# Patient Record
Sex: Female | Born: 1945 | Race: White | Hispanic: No | Marital: Married | Smoking: Never smoker
Health system: Southern US, Community
[De-identification: ages and names within clinical notes are randomized; demographics above are authoritative.]

## PROBLEM LIST (undated history)

## (undated) DIAGNOSIS — M659 Unspecified synovitis and tenosynovitis, unspecified site: Secondary | ICD-10-CM

## (undated) DIAGNOSIS — J329 Chronic sinusitis, unspecified: Secondary | ICD-10-CM

## (undated) DIAGNOSIS — A770 Spotted fever due to Rickettsia rickettsii: Secondary | ICD-10-CM

## (undated) DIAGNOSIS — L570 Actinic keratosis: Secondary | ICD-10-CM

## (undated) DIAGNOSIS — H269 Unspecified cataract: Secondary | ICD-10-CM

## (undated) DIAGNOSIS — Z0282 Encounter for adoption services: Secondary | ICD-10-CM

## (undated) DIAGNOSIS — G709 Myoneural disorder, unspecified: Secondary | ICD-10-CM

## (undated) DIAGNOSIS — K219 Gastro-esophageal reflux disease without esophagitis: Secondary | ICD-10-CM

## (undated) DIAGNOSIS — I10 Essential (primary) hypertension: Secondary | ICD-10-CM

## (undated) DIAGNOSIS — Z789 Other specified health status: Secondary | ICD-10-CM

## (undated) DIAGNOSIS — E785 Hyperlipidemia, unspecified: Secondary | ICD-10-CM

## (undated) HISTORY — PX: OTHER SURGICAL HISTORY: SHX169

## (undated) HISTORY — DX: Other specified health status: Z78.9

## (undated) HISTORY — PX: CHOLECYSTECTOMY: SHX55

## (undated) HISTORY — DX: Gastro-esophageal reflux disease without esophagitis: K21.9

## (undated) HISTORY — DX: Unspecified cataract: H26.9

## (undated) HISTORY — DX: Actinic keratosis: L57.0

## (undated) HISTORY — DX: Chronic sinusitis, unspecified: J32.9

## (undated) HISTORY — DX: Synovitis and tenosynovitis, unspecified: M65.9

## (undated) HISTORY — DX: Encounter for adoption services: Z02.82

## (undated) HISTORY — PX: FOOT SURGERY: SHX648

## (undated) HISTORY — DX: Unspecified synovitis and tenosynovitis, unspecified site: M65.90

## (undated) HISTORY — PX: GALLBLADDER SURGERY: SHX652

## (undated) HISTORY — DX: Spotted fever due to Rickettsia rickettsii: A77.0

## (undated) HISTORY — PX: BUNIONECTOMY: SHX129

## (undated) HISTORY — PX: CATARACT EXTRACTION: SUR2

## (undated) HISTORY — PX: ABDOMINAL HYSTERECTOMY: SHX81

---

## 2004-11-22 HISTORY — PX: ACHILLES TENDON SURGERY: SHX542

## 2010-09-16 ENCOUNTER — Ambulatory Visit: Payer: Self-pay | Admitting: Family Medicine

## 2010-09-22 ENCOUNTER — Ambulatory Visit: Payer: Self-pay | Admitting: Family Medicine

## 2010-10-22 ENCOUNTER — Ambulatory Visit: Payer: Self-pay | Admitting: Family Medicine

## 2011-01-13 HISTORY — PX: COLONOSCOPY: SHX174

## 2011-10-01 ENCOUNTER — Encounter: Payer: Self-pay | Admitting: Internal Medicine

## 2011-10-01 ENCOUNTER — Ambulatory Visit (INDEPENDENT_AMBULATORY_CARE_PROVIDER_SITE_OTHER): Payer: Medicare Other | Admitting: Internal Medicine

## 2011-10-01 VITALS — BP 152/62 | HR 106 | Temp 98.8°F | Ht 61.0 in | Wt 148.0 lb

## 2011-10-01 DIAGNOSIS — Z01818 Encounter for other preprocedural examination: Secondary | ICD-10-CM

## 2011-10-01 DIAGNOSIS — E1142 Type 2 diabetes mellitus with diabetic polyneuropathy: Secondary | ICD-10-CM | POA: Insufficient documentation

## 2011-10-01 DIAGNOSIS — I10 Essential (primary) hypertension: Secondary | ICD-10-CM | POA: Insufficient documentation

## 2011-10-01 DIAGNOSIS — E1169 Type 2 diabetes mellitus with other specified complication: Secondary | ICD-10-CM | POA: Insufficient documentation

## 2011-10-01 DIAGNOSIS — E119 Type 2 diabetes mellitus without complications: Secondary | ICD-10-CM

## 2011-10-01 DIAGNOSIS — E785 Hyperlipidemia, unspecified: Secondary | ICD-10-CM

## 2011-10-01 NOTE — Progress Notes (Signed)
Subjective:    Patient ID: Anita Mercer, female    DOB: November 19, 1946, 65 y.o.   MRN: [id number]  HPI 65 year old female with a history of diabetes, hypertension, and hyperlipidemia presents to establish care. She reports that she has good control of her blood sugars and her recent hemoglobin A1c was 6.9%. She denies any low blood sugars. She denies any blood sugars greater than 200. She reports full compliance with her medications. In regards to her hypertension she reports that occasionally she has an elevated blood pressure in the setting of anxiety but most of the time her blood pressures are well controlled. She also reports full compliance with her lisinopril. In regards to her hyperlipidemia she reports full compliance with her simvastatin and has not noticed any problems with this medication.  She notes that she is scheduled for bilateral cataract removal in the coming weeks and needs preoperative clearance. She has scheduled preop appointment at the hospital next week.  Outpatient Encounter Prescriptions as of 10/01/2011  Medication Sig Dispense Refill  . aspirin EC 81 MG tablet Take 81 mg by mouth daily.        . insulin lispro (HUMALOG KWIKPEN) 100 UNIT/ML injection Inject into the skin as directed. Use only w/blood sugar greater than >250       . insulin NPH (HUMULIN N,NOVOLIN N) 100 UNIT/ML injection Inject 21 Units into the skin daily.        Marland Kitchen lisinopril (PRINIVIL,ZESTRIL) 10 MG tablet Take 10 mg by mouth daily.        . metFORMIN (GLUCOPHAGE) 1000 MG tablet Take 1,000 mg by mouth 2 (two) times daily.        . OMEGA-3 KRILL OIL 300 MG CAPS Take by mouth daily.        . simvastatin (ZOCOR) 20 MG tablet Take 20 mg by mouth at bedtime.        . sitaGLIPtin (JANUVIA) 100 MG tablet Take 100 mg by mouth daily.          Review of Systems  Constitutional: Negative for fever, chills, appetite change, fatigue and unexpected weight change.  HENT: Negative for ear pain, congestion, sore throat,  trouble swallowing, neck pain, voice change and sinus pressure.   Eyes: Negative for visual disturbance.  Respiratory: Negative for cough, shortness of breath, wheezing and stridor.   Cardiovascular: Negative for chest pain, palpitations and leg swelling.  Gastrointestinal: Negative for nausea, vomiting, abdominal pain, diarrhea, constipation, blood in stool, abdominal distention and anal bleeding.  Genitourinary: Negative for dysuria and flank pain.  Musculoskeletal: Negative for myalgias, arthralgias and gait problem.  Skin: Negative for color change and rash.  Neurological: Negative for dizziness and headaches.  Hematological: Negative for adenopathy. Does not bruise/bleed easily.  Psychiatric/Behavioral: Negative for suicidal ideas, sleep disturbance and dysphoric mood. The patient is not nervous/anxious.    BP 152/62  Pulse 106  Temp(Src) 98.8 F (37.1 C) (Oral)  Ht 5\' 1"  (1.549 m)  Wt 148 lb (67.132 kg)  BMI 27.96 kg/m2  SpO2 97%     Objective:   Physical Exam  Constitutional: She is oriented to person, place, and time. She appears well-developed and well-nourished. No distress.  HENT:  Head: Normocephalic and atraumatic.  Right Ear: External ear normal.  Left Ear: External ear normal.  Nose: Nose normal.  Mouth/Throat: Oropharynx is clear and moist. No oropharyngeal exudate.  Eyes: Conjunctivae are normal. Pupils are equal, round, and reactive to light. Right eye exhibits no discharge. Left eye exhibits no  discharge. No scleral icterus.  Neck: Normal range of motion. Neck supple. No tracheal deviation present. No thyromegaly present.  Cardiovascular: Normal rate, regular rhythm, normal heart sounds and intact distal pulses.  Exam reveals no gallop and no friction rub.   No murmur heard. Pulmonary/Chest: Effort normal and breath sounds normal. No respiratory distress. She has no wheezes. She has no rales. She exhibits no tenderness.  Abdominal: Soft. Bowel sounds are normal.  She exhibits no distension and no mass. There is no tenderness. There is no rebound and no guarding.  Musculoskeletal: Normal range of motion. She exhibits no edema and no tenderness.  Lymphadenopathy:    She has no cervical adenopathy.  Neurological: She is alert and oriented to person, place, and time. No cranial nerve deficit. She exhibits normal muscle tone. Coordination normal.  Skin: Skin is warm and dry. No rash noted. She is not diaphoretic. No erythema. No pallor.  Psychiatric: She has a normal mood and affect. Her behavior is normal. Judgment and thought content normal.          Assessment & Plan:  1. Diabetes mellitus - Pt reports good control. Last A1c of 6.9%. Will request reports from Dr. Tedd Sias. Follow up 2 months for physical.  2. Hypertension - BP elevated today, but pt anxious. She reports better control historically. Will continue lisinopril. Will get records from Dr. Tedd Sias.   3. Hyperlipidemia - Will get records on recent lipids from Dr. Tedd Sias. Continue simvastatin.  4. Cataract - Pt is scheduled for cataract removal. Based on provided history she would be low risk for perioperative cardiac events.  EKG today normal.

## 2011-10-18 ENCOUNTER — Ambulatory Visit: Payer: Self-pay | Admitting: Ophthalmology

## 2011-10-20 ENCOUNTER — Encounter: Payer: Self-pay | Admitting: Internal Medicine

## 2011-12-06 ENCOUNTER — Encounter: Payer: Medicare Other | Admitting: Internal Medicine

## 2012-01-17 ENCOUNTER — Ambulatory Visit: Payer: Self-pay | Admitting: Gastroenterology

## 2012-01-18 LAB — PATHOLOGY REPORT

## 2012-01-31 ENCOUNTER — Encounter: Payer: Self-pay | Admitting: Internal Medicine

## 2012-03-09 ENCOUNTER — Encounter: Payer: Self-pay | Admitting: Internal Medicine

## 2012-03-09 ENCOUNTER — Ambulatory Visit (INDEPENDENT_AMBULATORY_CARE_PROVIDER_SITE_OTHER): Payer: Medicare Other | Admitting: Internal Medicine

## 2012-03-09 DIAGNOSIS — Z0000 Encounter for general adult medical examination without abnormal findings: Secondary | ICD-10-CM

## 2012-03-09 MED ORDER — INSULIN NPH (HUMAN) (ISOPHANE) 100 UNIT/ML ~~LOC~~ SUSP: 25.0000 [IU] | Freq: Every day | SUBCUTANEOUS | Status: DC

## 2012-03-09 NOTE — Progress Notes (Signed)
Subjective:    Patient ID: Anita Mercer, female    DOB: 08/22/1946, 66 y.o.   MRN: [id number]  HPI   Subjective:    Anita Mercer is a 66 y.o. female who presents for Medicare Annual First preventive examination. She is doing well and has no concerns today.  Preventive Screening-Counseling & Management  Tobacco History  Smoking status  . Never Smoker   Smokeless tobacco  . Never Used     Problems Prior to Visit 1. Diabetes 2. Hypertension 3. Hyperlipidemia  Current Problems (verified) Patient Active Problem List  Diagnoses  . Hyperlipidemia  . Hypertension  . Diabetes mellitus    Medications Prior to Visit Current Outpatient Prescriptions on File Prior to Visit  Medication Sig Dispense Refill  . aspirin EC 81 MG tablet Take 81 mg by mouth daily.        . insulin lispro (HUMALOG KWIKPEN) 100 UNIT/ML injection Inject into the skin as directed. Use only w/blood sugar greater than >250       . insulin NPH (HUMULIN N,NOVOLIN N) 100 UNIT/ML injection Inject 21 Units into the skin daily.        Marland Kitchen lisinopril (PRINIVIL,ZESTRIL) 10 MG tablet Take 10 mg by mouth daily.        . metFORMIN (GLUCOPHAGE) 1000 MG tablet Take 1,000 mg by mouth 2 (two) times daily.        . OMEGA-3 KRILL OIL 300 MG CAPS Take by mouth daily.        . simvastatin (ZOCOR) 20 MG tablet Take 20 mg by mouth at bedtime.        . sitaGLIPtin (JANUVIA) 100 MG tablet Take 100 mg by mouth daily.          Current Medications (verified) Current Outpatient Prescriptions  Medication Sig Dispense Refill  . aspirin EC 81 MG tablet Take 81 mg by mouth daily.        . insulin lispro (HUMALOG KWIKPEN) 100 UNIT/ML injection Inject into the skin as directed. Use only w/blood sugar greater than >250       . insulin NPH (HUMULIN N,NOVOLIN N) 100 UNIT/ML injection Inject 21 Units into the skin daily.        Marland Kitchen lisinopril (PRINIVIL,ZESTRIL) 10 MG tablet Take 10 mg by mouth daily.        . metFORMIN (GLUCOPHAGE) 1000 MG tablet  Take 1,000 mg by mouth 2 (two) times daily.        . NON FORMULARY Neu Remedy-150mg  Benfoltiamine take one tablet by mouth daily      . OMEGA-3 KRILL OIL 300 MG CAPS Take by mouth daily.        . simvastatin (ZOCOR) 20 MG tablet Take 20 mg by mouth at bedtime.        . sitaGLIPtin (JANUVIA) 100 MG tablet Take 100 mg by mouth daily.           Allergies (verified) Review of patient's allergies indicates no known allergies.   PAST HISTORY  Family History Family History  Problem Relation Age of Onset  . Adopted: Yes    Social History History  Substance Use Topics  . Smoking status: Never Smoker   . Smokeless tobacco: Never Used  . Alcohol Use: No     Are there smokers in your home (other than you)? No  Risk Factors Current exercise habits: Home exercise routine includes walking, gardening every day..  Dietary issues discussed: Heart healthy, low carbs, low in saturated fat.   Cardiac risk  factors: advanced age (older than 26 for men, 60 for women) and diabetes mellitus.  Depression Screen (Note: if answer to either of the following is "Yes", a more complete depression screening is indicated)   Over the past two weeks, have you felt down, depressed or hopeless? No  Over the past two weeks, have you felt little interest or pleasure in doing things? No  Have you lost interest or pleasure in daily life? No  Do you often feel hopeless? No  Do you cry easily over simple problems? No  Activities of Daily Living In your present state of health, do you have any difficulty performing the following activities?:  Driving? No Managing money?  No Feeding yourself? No Getting from bed to chair? No Climbing a flight of stairs? No Preparing food and eating?: No Bathing or showering? No Getting dressed: No Getting to the toilet? No Using the toilet:No Moving around from place to place: No In the past year have you fallen or had a near fall?:No   Are you sexually active?  Yes  Do  you have more than one partner?  No  Hearing Difficulties: No Do you often ask people to speak up or repeat themselves? No Do you experience ringing or noises in your ears? Occasionally at night Do you have difficulty understanding soft or whispered voices? No   Do you feel that you have a problem with memory? No  Do you often misplace items? No  Do you feel safe at home?  Yes  Cognitive Testing  Alert? Yes  Normal Appearance?Yes  Oriented to person? Yes  Place? Yes   Time? Yes  Recall of three objects?  Yes  Can perform simple calculations? Yes  Displays appropriate judgment?Yes  Can read the correct time from a watch face?Yes     Advanced Directives have been discussed with the patient? Yes  List the Names of Other Physician/Practitioners you currently use: See List in Pt Chart     Screening Tests Health Maintenance  Topic Date Due  . Tetanus/tdap  05/06/1964  . Zostavax  05/06/2005  . Pneumococcal Polysaccharide Vaccine Age 66 And Over  05/06/2010  . Influenza Vaccine  08/22/2012  . Mammogram  05/30/2013  . Colonoscopy  01/16/2022    All answers were reviewed with the patient and necessary referrals were made. Pt has follow up with opthalmology after recent cataract removal.  Shelia Media, MD   03/09/2012   History reviewed: allergies, current medications, past family history, past medical history, past social history, past surgical history and problem list  Review of Systems A comprehensive review of systems was negative.    Objective:     Vision testing deferred because of recent cataract surgery and follow up scheduled with opthalmology.  Body mass index is 27.98 kg/(m^2). BP 142/64  Pulse 79  Temp(Src) 98.2 F (36.8 C) (Oral)  Ht 5' 1.5" (1.562 m)  Wt 150 lb 8 oz (68.266 kg)  BMI 27.98 kg/m2  SpO2 99%  General appearance: alert, cooperative, appears stated age and no distress Head: Normocephalic, without obvious abnormality, atraumatic Eyes:  conjunctivae/corneas clear. PERRL, EOM's intact. Fundi benign. Ears: normal TM's and external ear canals both ears Nose: Nares normal. Septum midline. Mucosa normal. No drainage or sinus tenderness. Throat: lips, mucosa, and tongue normal; teeth and gums normal Neck: no adenopathy, no carotid bruit, no JVD, supple, symmetrical, trachea midline and thyroid not enlarged, symmetric, no tenderness/mass/nodules Back: symmetric, no curvature. ROM normal. No CVA tenderness. Lungs: clear to  auscultation bilaterally Breasts: normal appearance, no masses or tenderness Heart: regular rate and rhythm, S1, S2 normal, no murmur, click, rub or gallop Abdomen: soft, non-tender; bowel sounds normal; no masses,  no organomegaly Extremities: extremities normal, atraumatic, no cyanosis or edema Pulses: 2+ and symmetric Skin: Skin color, texture, turgor normal. No rashes or lesions Lymph nodes: Cervical, supraclavicular, and axillary nodes normal. Neurologic: Alert and oriented X 3, normal strength and tone. Normal symmetric reflexes. Normal coordination and gait     Assessment:     66YO female with DM, hypertension, hyperlipidemia. Doing very well. Follows healthy diet and exercises regularly. Has scheduled follow up with endocrinology and opthalmology. Health maintenance UTD. Follow up here in 6 months.     Plan:     During the course of the visit the patient was educated and counseled about appropriate screening and preventive services including:    Nutrition counseling - Encouraged pt to continue efforts at healthy diet and exercise.   Patient Instructions (the written plan) was given to the patient.  Medicare Attestation I have personally reviewed: The patient's medical and social history Their use of alcohol, tobacco or illicit drugs Their current medications and supplements The patient's functional ability including ADLs,fall risks, home safety risks, cognitive, and hearing and visual  impairment Diet and physical activities Evidence for depression or mood disorders  The patient's weight, height, BMI, and visual acuity have been recorded in the chart.  I have made referrals, counseling, and provided education to the patient based on review of the above and I have provided the patient with a written personalized care plan for preventive services.     Shelia Media, MD   03/09/2012        Review of Systems     Objective:   Physical Exam        Assessment & Plan:

## 2012-06-01 ENCOUNTER — Other Ambulatory Visit: Payer: Self-pay | Admitting: Internal Medicine

## 2012-06-01 DIAGNOSIS — Z1239 Encounter for other screening for malignant neoplasm of breast: Secondary | ICD-10-CM

## 2012-06-01 DIAGNOSIS — C50919 Malignant neoplasm of unspecified site of unspecified female breast: Secondary | ICD-10-CM

## 2012-06-13 ENCOUNTER — Encounter: Payer: Self-pay | Admitting: Internal Medicine

## 2012-06-23 ENCOUNTER — Ambulatory Visit: Payer: Medicare Other | Admitting: Internal Medicine

## 2012-09-11 ENCOUNTER — Encounter: Payer: Self-pay | Admitting: Internal Medicine

## 2012-09-11 ENCOUNTER — Ambulatory Visit (INDEPENDENT_AMBULATORY_CARE_PROVIDER_SITE_OTHER): Payer: Medicare Other | Admitting: Internal Medicine

## 2012-09-11 VITALS — BP 146/82 | HR 86 | Temp 97.6°F | Ht 61.5 in | Wt 152.2 lb

## 2012-09-11 DIAGNOSIS — E785 Hyperlipidemia, unspecified: Secondary | ICD-10-CM

## 2012-09-11 DIAGNOSIS — I10 Essential (primary) hypertension: Secondary | ICD-10-CM

## 2012-09-11 DIAGNOSIS — E119 Type 2 diabetes mellitus without complications: Secondary | ICD-10-CM

## 2012-09-11 DIAGNOSIS — M792 Neuralgia and neuritis, unspecified: Secondary | ICD-10-CM | POA: Insufficient documentation

## 2012-09-11 DIAGNOSIS — IMO0002 Reserved for concepts with insufficient information to code with codable children: Secondary | ICD-10-CM

## 2012-09-11 MED ORDER — SITAGLIPTIN PHOSPHATE 100 MG PO TABS: 100.0000 mg | ORAL_TABLET | Freq: Every day | ORAL | Status: DC

## 2012-09-11 MED ORDER — TRAMADOL HCL 50 MG PO TABS: 50.0000 mg | ORAL_TABLET | Freq: Three times a day (TID) | ORAL | Status: DC | PRN

## 2012-09-11 NOTE — Assessment & Plan Note (Signed)
Blood pressure slightly elevated today but has been well controlled at home. We'll continue lisinopril. Followup in 6 months.

## 2012-09-11 NOTE — Assessment & Plan Note (Signed)
Blood sugars well controlled per pt. Will request recent A1c from endocrinologist. Continue lantus. Follow up here in 6 months and prn.

## 2012-09-11 NOTE — Progress Notes (Signed)
Subjective:    Patient ID: Anita Mercer, female    DOB: 1946-01-12, 66 y.o.   MRN: [id number]  HPI 66 year old female with history of diabetes, hypertension, hyperlipidemia presents for followup. In regards to diabetes, she reports that blood sugars have been well controlled with fasting blood sugars typically near 100. She reports full compliance with Lantus. She denies any low sugars or sugars greater than 200.  In regards to hypertension hyperlipidemia, she reports full compliance with medications. She denies any side effects from her medicines.  She is concerned today about nerve pain over her feet and lower legs. She describes a sense of heaviness in her legs. She reports that when she wears tennis shoes the pain in her feet is worse. Her endocrinologist recommended that she try Neurontin. However, after reading side effects of this medication she did not start the medicine. She is currently using ibuprofen and Tylenol as needed. She would like to use Vicodin as needed at night for severe pain.  Outpatient Encounter Prescriptions as of 09/11/2012  Medication Sig Dispense Refill  . aspirin EC 81 MG tablet Take 81 mg by mouth daily.        . insulin glargine (LANTUS) 100 UNIT/ML injection Inject 30 Units into the skin daily.      . insulin lispro (HUMALOG KWIKPEN) 100 UNIT/ML injection Inject into the skin as directed. Use only w/blood sugar greater than >250       . lisinopril (PRINIVIL,ZESTRIL) 10 MG tablet Take 10 mg by mouth daily.        . metFORMIN (GLUCOPHAGE) 1000 MG tablet Take 1,000 mg by mouth 2 (two) times daily.        . NON FORMULARY Neu Remedy-150mg  Benfoltiamine take one tablet by mouth daily      . OMEGA-3 KRILL OIL 300 MG CAPS Take by mouth daily.        . simvastatin (ZOCOR) 20 MG tablet Take 20 mg by mouth at bedtime.        . sitaGLIPtin (JANUVIA) 100 MG tablet Take 1 tablet (100 mg total) by mouth daily.  30 tablet  11   BP 146/82  Pulse 86  Temp 97.6 F (36.4 C)  (Oral)  Ht 5' 1.5" (1.562 m)  Wt 152 lb 4 oz (69.06 kg)  BMI 28.30 kg/m2  SpO2 92%  Review of Systems  Constitutional: Negative for fever, chills, appetite change, fatigue and unexpected weight change.  HENT: Negative for ear pain, congestion, sore throat, trouble swallowing, neck pain, voice change and sinus pressure.   Eyes: Negative for visual disturbance.  Respiratory: Negative for cough, shortness of breath, wheezing and stridor.   Cardiovascular: Negative for chest pain, palpitations and leg swelling.  Gastrointestinal: Negative for nausea, vomiting, abdominal pain, diarrhea, constipation, blood in stool, abdominal distention and anal bleeding.  Genitourinary: Negative for dysuria and flank pain.  Musculoskeletal: Negative for myalgias, arthralgias and gait problem.  Skin: Negative for color change and rash.  Neurological: Negative for dizziness and headaches.  Hematological: Negative for adenopathy. Does not bruise/bleed easily.  Psychiatric/Behavioral: Negative for suicidal ideas, disturbed wake/sleep cycle and dysphoric mood. The patient is not nervous/anxious.        Objective:   Physical Exam  Constitutional: She is oriented to person, place, and time. She appears well-developed and well-nourished. No distress.  HENT:  Head: Normocephalic and atraumatic.  Right Ear: External ear normal.  Left Ear: External ear normal.  Nose: Nose normal.  Mouth/Throat: Oropharynx is clear and moist. No  oropharyngeal exudate.  Eyes: Conjunctivae normal are normal. Pupils are equal, round, and reactive to light. Right eye exhibits no discharge. Left eye exhibits no discharge. No scleral icterus.  Neck: Normal range of motion. Neck supple. No tracheal deviation present. No thyromegaly present.  Cardiovascular: Normal rate, regular rhythm, normal heart sounds and intact distal pulses.  Exam reveals no gallop and no friction rub.   No murmur heard. Pulmonary/Chest: Effort normal and breath  sounds normal. No respiratory distress. She has no wheezes. She has no rales. She exhibits no tenderness.  Musculoskeletal: Normal range of motion. She exhibits no edema and no tenderness.  Lymphadenopathy:    She has no cervical adenopathy.  Neurological: She is alert and oriented to person, place, and time. No cranial nerve deficit. She exhibits normal muscle tone. Coordination normal.  Skin: Skin is warm and dry. No rash noted. She is not diaphoretic. No erythema. No pallor.  Psychiatric: She has a normal mood and affect. Her behavior is normal. Judgment and thought content normal.          Assessment & Plan:

## 2012-09-11 NOTE — Assessment & Plan Note (Signed)
Symptoms are most consistent with diabetic neuropathy. Patient's endocrinologist tried starting her on Neurontin but she did not take this medication because she was concerned about side effects. Encouraged her to reconsider trying this. She specifically requests Vicodin. Would prefer to try a nonnarcotic medications first. Will give a trial of tramadol to see if any improvement. If no improvement, consider both referral to neurology for EMG testing and also potentially referral to pain management.

## 2012-09-11 NOTE — Assessment & Plan Note (Signed)
Will request results from recent lipids from her endocrinologist. Continue simvastatin.

## 2012-09-14 ENCOUNTER — Encounter: Payer: Self-pay | Admitting: Internal Medicine

## 2012-09-14 DIAGNOSIS — G629 Polyneuropathy, unspecified: Secondary | ICD-10-CM

## 2012-09-19 NOTE — Telephone Encounter (Signed)
Can you please set up neurology referral? Thanks

## 2012-09-20 LAB — HM DIABETES EYE EXAM

## 2012-10-23 HISTORY — PX: OTHER SURGICAL HISTORY: SHX169

## 2012-11-25 ENCOUNTER — Encounter: Payer: Self-pay | Admitting: Internal Medicine

## 2012-11-27 ENCOUNTER — Other Ambulatory Visit (INDEPENDENT_AMBULATORY_CARE_PROVIDER_SITE_OTHER): Payer: Medicare PPO

## 2012-11-27 ENCOUNTER — Telehealth: Payer: Self-pay | Admitting: *Deleted

## 2012-11-27 DIAGNOSIS — N390 Urinary tract infection, site not specified: Secondary | ICD-10-CM

## 2012-11-27 LAB — POCT URINALYSIS DIPSTICK
Protein, UA: NEGATIVE
Urobilinogen, UA: 0.2

## 2012-11-27 NOTE — Telephone Encounter (Signed)
Hematuria. Please add a culture

## 2012-11-27 NOTE — Telephone Encounter (Signed)
What labs and dx would you like for this pt? Is she here for just a udip?

## 2012-11-29 ENCOUNTER — Other Ambulatory Visit (INDEPENDENT_AMBULATORY_CARE_PROVIDER_SITE_OTHER): Payer: Medicare PPO

## 2012-11-29 DIAGNOSIS — R319 Hematuria, unspecified: Secondary | ICD-10-CM

## 2012-11-29 LAB — POCT URINALYSIS DIPSTICK
Blood, UA: NEGATIVE
Protein, UA: NEGATIVE
Spec Grav, UA: 1.015
Urobilinogen, UA: 0.2

## 2012-11-29 LAB — URINE CULTURE: Colony Count: NO GROWTH

## 2013-01-18 ENCOUNTER — Encounter: Payer: Self-pay | Admitting: Internal Medicine

## 2013-03-04 ENCOUNTER — Other Ambulatory Visit: Payer: Self-pay | Admitting: Internal Medicine

## 2013-03-08 ENCOUNTER — Other Ambulatory Visit: Payer: Self-pay | Admitting: Internal Medicine

## 2013-03-13 ENCOUNTER — Other Ambulatory Visit: Payer: Self-pay | Admitting: Internal Medicine

## 2013-03-13 NOTE — Telephone Encounter (Signed)
Received refill request electronically. Last office visit 09/11/12. Is it okay to refill?

## 2013-03-15 ENCOUNTER — Encounter: Payer: Self-pay | Admitting: Internal Medicine

## 2013-03-15 ENCOUNTER — Ambulatory Visit (INDEPENDENT_AMBULATORY_CARE_PROVIDER_SITE_OTHER): Payer: Medicare PPO | Admitting: Internal Medicine

## 2013-03-15 VITALS — BP 150/80 | HR 88 | Temp 99.0°F | Ht 61.25 in | Wt 153.0 lb

## 2013-03-15 DIAGNOSIS — M792 Neuralgia and neuritis, unspecified: Secondary | ICD-10-CM

## 2013-03-15 DIAGNOSIS — J302 Other seasonal allergic rhinitis: Secondary | ICD-10-CM

## 2013-03-15 DIAGNOSIS — J309 Allergic rhinitis, unspecified: Secondary | ICD-10-CM

## 2013-03-15 DIAGNOSIS — I10 Essential (primary) hypertension: Secondary | ICD-10-CM

## 2013-03-15 DIAGNOSIS — E119 Type 2 diabetes mellitus without complications: Secondary | ICD-10-CM

## 2013-03-15 DIAGNOSIS — E785 Hyperlipidemia, unspecified: Secondary | ICD-10-CM

## 2013-03-15 DIAGNOSIS — IMO0002 Reserved for concepts with insufficient information to code with codable children: Secondary | ICD-10-CM

## 2013-03-15 DIAGNOSIS — Z0000 Encounter for general adult medical examination without abnormal findings: Secondary | ICD-10-CM

## 2013-03-15 LAB — COMPREHENSIVE METABOLIC PANEL
ALT: 23 U/L (ref 0–35)
BUN: 16 mg/dL (ref 6–23)
CO2: 29 mEq/L (ref 19–32)
Calcium: 9.8 mg/dL (ref 8.4–10.5)
Chloride: 98 mEq/L (ref 96–112)
Creatinine, Ser: 0.6 mg/dL (ref 0.4–1.2)
GFR: 99.92 mL/min (ref >60.00)

## 2013-03-15 LAB — LIPID PANEL
Cholesterol: 129 mg/dL (ref 0–200)
HDL: 34.9 mg/dL — ABNORMAL LOW (ref >39.00)
LDL Cholesterol: 65 mg/dL (ref 0–99)
Triglycerides: 148 mg/dL (ref 0.0–149.0)
VLDL: 29.6 mg/dL (ref 0.0–40.0)

## 2013-03-15 LAB — CBC WITH DIFFERENTIAL/PLATELET
Basophils Absolute: 0 10*3/uL (ref 0.0–0.1)
Basophils Relative: 0.5 % (ref 0.0–3.0)
Eosinophils Absolute: 0 10*3/uL (ref 0.0–0.7)
Lymphocytes Relative: 37.4 % (ref 12.0–46.0)
MCHC: 33.2 g/dL (ref 30.0–36.0)
MCV: 82.9 fl (ref 78.0–100.0)
Monocytes Absolute: 0.4 10*3/uL (ref 0.1–1.0)
Neutrophils Relative %: 55.5 % (ref 43.0–77.0)
RDW: 12.7 % (ref 11.5–14.6)

## 2013-03-15 MED ORDER — ONETOUCH ULTRA CONTROL VI SOLN: 1.0000 [drp] | Freq: Once | Status: DC

## 2013-03-15 MED ORDER — ONETOUCH ULTRASOFT LANCETS MISC: Status: DC

## 2013-03-15 MED ORDER — TRAMADOL HCL 50 MG PO TABS: 50.0000 mg | ORAL_TABLET | Freq: Three times a day (TID) | ORAL | Status: DC | PRN

## 2013-03-15 MED ORDER — GLUCOSE BLOOD VI STRP: ORAL_STRIP | Status: DC

## 2013-03-15 NOTE — Assessment & Plan Note (Signed)
BP Readings from Last 3 Encounters:  03/15/13 150/80  09/11/12 146/82  03/09/12 142/64   Blood pressure slightly elevated today but generally has been well-controlled with lisinopril. Will check renal function with labs today.

## 2013-03-15 NOTE — Assessment & Plan Note (Addendum)
Last A1c 7.6% per endocrinologist. BG have been well-controlled on current regimen. We'll plan to continue. Encouraged continued efforts at healthy diet and regular physical activity.

## 2013-03-15 NOTE — Assessment & Plan Note (Signed)
General medical exam including breast exam normal today. Pap and pelvic deferred given patient's age and preference. Health maintenance is up to date except for mammogram which will be scheduled for July 2014. Encourage continued efforts at healthy diet and regular physical activity.

## 2013-03-15 NOTE — Assessment & Plan Note (Signed)
Symptoms of seasonal allergies including rhinorrhea, watery eyes, sneezing. Encouraged use of nonsedating antihistamine such as Zyrtec or allegra given that Claritin has not worked well for her in the past.

## 2013-03-15 NOTE — Assessment & Plan Note (Signed)
Chronic neuropathic pain in both arms and legs. Neurology evaluation with EMG testing showed mild to moderate peripheral neuropathy. Symptoms are currently managed well with use of intermittent tramadol. We discussed potentially adding Neurontin in the future, even adding 100 mg of Neurontin at bedtime should symptoms be progressively worsening. We also discussed importance of good glucose control. Followup in 6 months.

## 2013-03-15 NOTE — Progress Notes (Signed)
Subjective:    Patient ID: Anita Mercer, female    DOB: Sep 23, 1946, 67 y.o.   MRN: [id number]  HPI The patient is here for annual Medicare wellness examination and management of other chronic and acute problems.   The risk factors are reflected in the social history.  The roster of all physicians providing medical care to patient - is listed in the Snapshot section of the chart.  Activities of daily living:  The patient is 100% independent in all ADLs: dressing, toileting, feeding as well as independent mobility  Home safety : The patient has smoke detectors in the home. They wear seatbelts.  There are no firearms at home. There is no violence in the home.   There is no risks for hepatitis, STDs or HIV. There is no history of blood transfusion. They have no travel history to infectious disease endemic areas of the world.  The patient has seen their dentist in the last six month. (Dr. Jarold Motto) They have seen their eye doctor in the last year. (Dr. Dorcas Mcmurray) No issues with hearing.  They have deferred audiologic testing in the last year.   They do not  have excessive sun exposure. Discussed the need for sun protection: hats, long sleeves and use of sunscreen if there is significant sun exposure. (Dermatologist - none)  Diet: the importance of a healthy diet is discussed. They do have a healthy diet.  The benefits of regular aerobic exercise were discussed. She walks [place], yardwork.  Depression screen: there are no signs or vegative symptoms of depression- irritability, change in appetite, anhedonia, sadness/tearfullness.  Cognitive assessment: the patient manages all their financial and personal affairs and is actively engaged. They could relate day,date,year and events.  HCPOA - Mal Amabile, attorney  The following portions of the patient's history were reviewed and updated as appropriate: allergies, current medications, past family history, past medical history,  past  surgical history, past social history  and problem list.  Visual acuity was not assessed per patient preference since she has regular follow up with her ophthalmologist. Hearing and body mass index were assessed and reviewed.   During the course of the visit the patient was educated and counseled about appropriate screening and preventive services including : fall prevention , diabetes screening, nutrition counseling, colorectal cancer screening, and recommended immunizations.    In regards to diabetes, patient reports blood sugars have been well controlled. Recent A1c 7.6% when checked by her endocrinologist. She is compliant with medications. She questions whether she needs diabetic shoes. She has never had a diabetic foot ulcer.  She continues to be concerned about burning pain in her distal arms and legs. This is most prominent at night. It sometimes keeps her from sleeping. It is also worsened by activity. She was seen by neurology and underwent EMG testing which showed mild to moderate peripheral neuropathy. It was recommended that she start Neurontin. However, she feels that control of symptoms is adequate with use of intermittent tramadol. She prefers not to start Neurontin at this time.  She also notes symptoms of seasonal allergies including watery eyes and runny nose. This is typical for her during the spring months. In the past, she tried using Claritin with no improvement. She questions what other medications might be helpful. She has not recently had fever, chills, cough.  Outpatient Encounter Prescriptions as of 03/15/2013  Medication Sig Dispense Refill  . aspirin EC 81 MG tablet Take 81 mg by mouth daily.        Marland Kitchen  B-D UF III MINI PEN NEEDLES 31G X 5 MM MISC       . insulin glargine (LANTUS) 100 UNIT/ML injection Inject 35 Units into the skin daily.       . insulin lispro (HUMALOG KWIKPEN) 100 UNIT/ML injection Inject into the skin as directed. Use only w/blood sugar greater than >250        . lisinopril (PRINIVIL,ZESTRIL) 10 MG tablet Take 10 mg by mouth daily.        . metFORMIN (GLUCOPHAGE) 1000 MG tablet Take 1,000 mg by mouth 2 (two) times daily.        . OMEGA-3 KRILL OIL 300 MG CAPS Take by mouth daily.        . simvastatin (ZOCOR) 20 MG tablet Take 20 mg by mouth at bedtime.        . sitaGLIPtin (JANUVIA) 100 MG tablet Take 1 tablet (100 mg total) by mouth daily.  30 tablet  11  . traMADol (ULTRAM) 50 MG tablet TAKE ONE TABLET BY MOUTH EVERY 8 HOURS  AS NEEDED FOR RELIEF FROM PAIN  30 tablet  0  . Blood Glucose Calibration (OT ULTRA/FASTTK CNTRL SOLN) SOLN 1 drop by In Vitro route once. Please dispense controls for patient to use at home with One Touch Ultra machine. Dx code 250.0  1 each  0  . glucose blood test strip Please dispense One Touch Ultra testing strips for patient to use with home device. Checking blood sugars 2-3 times a day. Dx code 250.0  100 each  12  . Lancets (ONETOUCH ULTRASOFT) lancets Please dispense One Touch Ultra soft lancet for patient to use at home. Patient is checking at home 2-3 times a day.  Dx code 250.0  100 each  12  . NON FORMULARY Neu Remedy-150mg  Benfoltiamine take one tablet by mouth daily      . traMADol (ULTRAM) 50 MG tablet Take 1 tablet (50 mg total) by mouth every 8 (eight) hours as needed for pain.  90 tablet  3   No facility-administered encounter medications on file as of 03/15/2013.     Review of Systems  Constitutional: Negative for fever, chills, appetite change, fatigue and unexpected weight change.  HENT: Positive for rhinorrhea and postnasal drip. Negative for ear pain, congestion, sore throat, trouble swallowing, neck pain, voice change and sinus pressure.   Eyes: Positive for discharge. Negative for visual disturbance.  Respiratory: Negative for cough, shortness of breath, wheezing and stridor.   Cardiovascular: Negative for chest pain, palpitations and leg swelling.  Gastrointestinal: Negative for nausea,  vomiting, abdominal pain, diarrhea, constipation, blood in stool, abdominal distention and anal bleeding.  Genitourinary: Negative for dysuria and flank pain.  Musculoskeletal: Positive for myalgias. Negative for arthralgias and gait problem.  Skin: Negative for color change and rash.  Neurological: Positive for numbness. Negative for dizziness, weakness and headaches.  Hematological: Negative for adenopathy. Does not bruise/bleed easily.  Psychiatric/Behavioral: Negative for suicidal ideas, sleep disturbance and dysphoric mood. The patient is not nervous/anxious.        Objective:   Physical Exam  Constitutional: She is oriented to person, place, and time. She appears well-developed and well-nourished. No distress.  HENT:  Head: Normocephalic and atraumatic.  Right Ear: External ear normal.  Left Ear: External ear normal.  Nose: Nose normal.  Mouth/Throat: Oropharynx is clear and moist. No oropharyngeal exudate.  Eyes: Conjunctivae are normal. Pupils are equal, round, and reactive to light. Right eye exhibits no discharge. Left eye exhibits no  discharge. No scleral icterus.  Neck: Normal range of motion. Neck supple. No tracheal deviation present. No thyromegaly present.  Cardiovascular: Normal rate, regular rhythm, normal heart sounds and intact distal pulses.  Exam reveals no gallop and no friction rub.   No murmur heard. Pulmonary/Chest: Effort normal and breath sounds normal. No accessory muscle usage. Not tachypneic. No respiratory distress. She has no decreased breath sounds. She has no wheezes. She has no rhonchi. She has no rales. She exhibits no tenderness.  Abdominal: Soft. Bowel sounds are normal. She exhibits no distension and no mass. There is no tenderness. There is no rebound and no guarding.  Musculoskeletal: Normal range of motion. She exhibits no edema and no tenderness.  Lymphadenopathy:    She has no cervical adenopathy.  Neurological: She is alert and oriented to  person, place, and time. No cranial nerve deficit. She exhibits normal muscle tone. Coordination normal.  Skin: Skin is warm and dry. No rash noted. She is not diaphoretic. No erythema. No pallor.  Psychiatric: She has a normal mood and affect. Her behavior is normal. Judgment and thought content normal.          Assessment & Plan:

## 2013-03-25 ENCOUNTER — Encounter: Payer: Self-pay | Admitting: Internal Medicine

## 2013-03-25 DIAGNOSIS — E119 Type 2 diabetes mellitus without complications: Secondary | ICD-10-CM

## 2013-03-26 MED ORDER — INSULIN PEN NEEDLE 31G X 5 MM MISC: Status: DC

## 2013-04-04 ENCOUNTER — Ambulatory Visit: Payer: Self-pay | Admitting: Ophthalmology

## 2013-04-16 ENCOUNTER — Encounter: Payer: Self-pay | Admitting: Internal Medicine

## 2013-04-17 ENCOUNTER — Encounter: Payer: Self-pay | Admitting: Internal Medicine

## 2013-04-17 ENCOUNTER — Ambulatory Visit (INDEPENDENT_AMBULATORY_CARE_PROVIDER_SITE_OTHER): Payer: Medicare PPO | Admitting: Internal Medicine

## 2013-04-17 VITALS — BP 142/70 | HR 79 | Temp 97.5°F | Resp 16 | Wt 154.5 lb

## 2013-04-17 DIAGNOSIS — N390 Urinary tract infection, site not specified: Secondary | ICD-10-CM

## 2013-04-17 DIAGNOSIS — R300 Dysuria: Secondary | ICD-10-CM

## 2013-04-17 LAB — POCT URINALYSIS DIPSTICK
Bilirubin, UA: NEGATIVE
Blood, UA: NEGATIVE
Glucose, UA: NEGATIVE
Ketones, UA: NEGATIVE
Spec Grav, UA: <=1.005
Urobilinogen, UA: 0.2

## 2013-04-17 MED ORDER — CIPROFLOXACIN HCL 250 MG PO TABS: 250.0000 mg | ORAL_TABLET | Freq: Two times a day (BID) | ORAL | Status: DC

## 2013-04-17 NOTE — Patient Instructions (Addendum)
We will treat your symptoms with a 5 day course of ciprofloxacin. Even though your urinalysis done here today was normal.    We will send urine for culture to be sure  If symptoms do not resolve this time,  Make an appt for a pelvic exam   Eat some yogurt to prevent diarrhea and yeast infections  (Dannon Light n Fit greek has only 9 carbs)

## 2013-04-17 NOTE — Progress Notes (Signed)
Patient ID: Anita Mercer, female   DOB: Sep 17, 1946, 67 y.o.   MRN: [id number]   Patient Active Problem List   Diagnosis Date Noted  . Urinary tract infection, site not specified 04/18/2013  . Medicare annual wellness visit, subsequent 03/15/2013  . Seasonal allergies 03/15/2013  . Neuropathic pain 09/11/2012  . Hyperlipidemia 10/01/2011  . Hypertension 10/01/2011  . Diabetes mellitus type 2, controlled 10/01/2011    Subjective:  CC:   Chief Complaint  Patient presents with  . Acute Visit    dysuria, polyuria,odor noted.    HPI:   Anita Mercer a 67 y.o. female who presents with uinary symptoms, persistent,  She was treated at [facility]/22 for 3 day history of strong  urinary odor followed  By frequent voids,  Minor symptoms of  dysuria. Was treated with fosfomycin on 5/22 with no change in symptoms. So she retested herself at home yesterday  And dipstick was positive.  Did not self clean prior to testing.  Some low back pain but no fevers suprapubic pain or nausea.     Past Medical History  Diagnosis Date  . [facility] spotted fever)   . Diabetes mellitus 2000    Past Surgical History  Procedure Laterality Date  . Cholecystectomy    . Abdominal hysterectomy    . Foot surgery      Dr. Charlsie Merles, bone spurs     The following portions of the patient's history were reviewed and updated as appropriate: Allergies, current medications, and problem list.    Review of Systems:   Patient denies headache, fevers, malaise, unintentional weight loss, skin rash, eye pain, sinus congestion and sinus pain, sore throat, dysphagia,  hemoptysis , cough, dyspnea, wheezing, chest pain, palpitations, orthopnea, edema, abdominal pain, nausea, melena, diarrhea, constipation, flank pain, numbness, tingling, seizures,  Focal weakness, Loss of consciousness,  Tremor, insomnia, depression, anxiety, and suicidal ideation.     History   Social History  . Marital Status: Married     Spouse Name: N/A    Number of Children: 1  . Years of Education: N/A   Occupational History  . Retired - Engineer, manufacturing     31 yrs [employer]   Social History Main Topics  . Smoking status: Never Smoker   . Smokeless tobacco: Never Used  . Alcohol Use: No  . Drug Use: No  . Sexually Active: Not on file   Other Topics Concern  . Not on file   Social History Narrative   Lives in [place] with husband. Moved from [place]. Orig from [place]. Daughter lives in [place]. Dogs at home. Engineer, manufacturing at [employer]. Hobbies - stained glass, woodcarving.      Diet: regular, limited carbs. Daily Caffeine Use:  Coffee 2-3   Regular Exercise -  Daily, yardwork    Objective:  BP 142/70  Pulse 79  Temp(Src) 97.5 F (36.4 C) (Oral)  Resp 16  Wt 154 lb 8 oz (70.081 kg)  BMI 28.95 kg/m2  SpO2 98%  General appearance: alert, cooperative and appears stated age Ears: normal TM's and external ear canals both ears Throat: lips, mucosa, and tongue normal; teeth and gums normal Neck: no adenopathy, no carotid bruit, supple, symmetrical, trachea midline and thyroid not enlarged, symmetric, no tenderness/mass/nodules Back: symmetric, no curvature. ROM normal. No CVA tenderness. Lungs: clear to auscultation bilaterally Heart: regular rate and rhythm, S1, S2 normal, no murmur, click, rub or gallop Abdomen: soft, non-tender; bowel sounds normal; no masses,  no organomegaly Pulses: 2+ and symmetric Skin: Skin color, texture, turgor normal. No rashes or lesions Lymph nodes: Cervical, supraclavicular, and axillary nodes normal.  Assessment and Plan:  Urinary tract infection, site not specified UA was normal but given persistent symptoms will treat empirically with cipro pendign urine culture,  Patient advised that if urine culture is negative and sympotms persist, whe should return for a pelvic exam.    Updated Medication List Outpatient Encounter Prescriptions as of 04/17/2013   Medication Sig Dispense Refill  . aspirin EC 81 MG tablet Take 81 mg by mouth daily.        . Blood Glucose Calibration (OT ULTRA/FASTTK CNTRL SOLN) SOLN 1 drop by In Vitro route once. Please dispense controls for patient to use at home with One Touch Ultra machine. Dx code 250.0  1 each  0  . glucose blood test strip Please dispense One Touch Ultra testing strips for patient to use with home device. Checking blood sugars 2-3 times a day. Dx code 250.0  100 each  12  . insulin glargine (LANTUS) 100 UNIT/ML injection Inject 35 Units into the skin daily.       . insulin lispro (HUMALOG KWIKPEN) 100 UNIT/ML injection Inject into the skin as directed. Use only w/blood sugar greater than >250       . Insulin Pen Needle (B-D UF III MINI PEN NEEDLES) 31G X 5 MM MISC Patient is checking blood sugars at home 2-3 times a day.  Dx code 250.0  100 each  3  . Lancets (ONETOUCH ULTRASOFT) lancets Please dispense One Touch Ultra soft lancet for patient to use at home. Patient is checking at home 2-3 times a day.  Dx code 250.0  100 each  12  . lisinopril (PRINIVIL,ZESTRIL) 10 MG tablet Take 10 mg by mouth daily.        . metFORMIN (GLUCOPHAGE) 1000 MG tablet Take 1,000 mg by mouth 2 (two) times daily.        . NON FORMULARY Neu Remedy-150mg  Benfoltiamine take one tablet by mouth daily      . OMEGA-3 KRILL OIL 300 MG CAPS Take by mouth daily.        . simvastatin (ZOCOR) 20 MG tablet Take 20 mg by mouth at bedtime.        . sitaGLIPtin (JANUVIA) 100 MG tablet Take 1 tablet (100 mg total) by mouth daily.  30 tablet  11  . traMADol (ULTRAM) 50 MG tablet TAKE ONE TABLET BY MOUTH EVERY 8 HOURS  AS NEEDED FOR RELIEF FROM PAIN  30 tablet  0  . traMADol (ULTRAM) 50 MG tablet Take 1 tablet (50 mg total) by mouth every 8 (eight) hours as needed for pain.  90 tablet  3  . ciprofloxacin (CIPRO) 250 MG tablet Take 1 tablet (250 mg total) by mouth 2 (two) times daily.  10 tablet  0   No facility-administered encounter  medications on file as of 04/17/2013.     Orders Placed This Encounter  Procedures  . CULTURE, URINE COMPREHENSIVE  . POCT urinalysis dipstick    No Follow-up on file.

## 2013-04-18 DIAGNOSIS — N390 Urinary tract infection, site not specified: Secondary | ICD-10-CM | POA: Insufficient documentation

## 2013-04-18 NOTE — Assessment & Plan Note (Signed)
UA was normal but given persistent symptoms will treat empirically with cipro pendign urine culture,  Patient advised that if urine culture is negative and sympotms persist, whe should return for a pelvic exam.

## 2013-04-19 LAB — CULTURE, URINE COMPREHENSIVE: Colony Count: 3000

## 2013-04-26 ENCOUNTER — Encounter: Payer: Self-pay | Admitting: Internal Medicine

## 2013-05-04 ENCOUNTER — Encounter: Payer: Self-pay | Admitting: Internal Medicine

## 2013-05-04 ENCOUNTER — Ambulatory Visit (INDEPENDENT_AMBULATORY_CARE_PROVIDER_SITE_OTHER): Payer: Medicare PPO | Admitting: Internal Medicine

## 2013-05-04 VITALS — BP 122/70 | HR 106 | Temp 99.1°F | Wt 150.0 lb

## 2013-05-04 DIAGNOSIS — J209 Acute bronchitis, unspecified: Secondary | ICD-10-CM | POA: Insufficient documentation

## 2013-05-04 MED ORDER — AMOXICILLIN-POT CLAVULANATE 875-125 MG PO TABS: 1.0000 | ORAL_TABLET | Freq: Two times a day (BID) | ORAL | Status: DC

## 2013-05-04 MED ORDER — HYDROCOD POLST-CHLORPHEN POLST 10-8 MG/5ML PO LQCR: 5.0000 mL | Freq: Two times a day (BID) | ORAL | Status: DC | PRN

## 2013-05-04 NOTE — Assessment & Plan Note (Signed)
Symptoms and exam are consistent with bronchitis. Also question sinus infection in the right maxillary and frontal sinuses after recent surgery. Will treat with Augmentin and use Tussionex as needed for cough. Patient will call if any recurrent fever after 24 hours on antibiotics. If recurrent fever, would favor getting chest x-ray. Followup in 4 days for recheck.

## 2013-05-04 NOTE — Progress Notes (Signed)
Subjective:    Patient ID: Anita Mercer, female    DOB: 09-Jan-1946, 67 y.o.   MRN: [id number]  HPI 67 year old female presents for acute visit complaining of fever, chills, and cough. She reports she was feeling well until Monday when she underwent surgical procedure to remove cataract from her right eye. After that procedure, she developed fever, chills, cough, and sinus congestion. She was seen by her surgeon in followup on Wednesday and surgical site appeared well. She has continued to have fevers and cough which is generally nonproductive. She describes diffuse headache and sinus pressure. She has been taking Mucinex with no improvement.  Outpatient Encounter Prescriptions as of 05/04/2013  Medication Sig Dispense Refill  . aspirin EC 81 MG tablet Take 81 mg by mouth daily.        . Blood Glucose Calibration (OT ULTRA/FASTTK CNTRL SOLN) SOLN 1 drop by In Vitro route once. Please dispense controls for patient to use at home with One Touch Ultra machine. Dx code 250.0  1 each  0  . glucose blood test strip Please dispense One Touch Ultra testing strips for patient to use with home device. Checking blood sugars 2-3 times a day. Dx code 250.0  100 each  12  . insulin glargine (LANTUS) 100 UNIT/ML injection Inject 35 Units into the skin daily.       . insulin lispro (HUMALOG KWIKPEN) 100 UNIT/ML injection Inject into the skin as directed. Use only w/blood sugar greater than >250       . Insulin Pen Needle (B-D UF III MINI PEN NEEDLES) 31G X 5 MM MISC Patient is checking blood sugars at home 2-3 times a day.  Dx code 250.0  100 each  3  . Lancets (ONETOUCH ULTRASOFT) lancets Please dispense One Touch Ultra soft lancet for patient to use at home. Patient is checking at home 2-3 times a day.  Dx code 250.0  100 each  12  . lisinopril (PRINIVIL,ZESTRIL) 10 MG tablet Take 10 mg by mouth daily.        . metFORMIN (GLUCOPHAGE) 1000 MG tablet Take 1,000 mg by mouth 2 (two) times daily.        . NON FORMULARY  Neu Remedy-150mg  Benfoltiamine take one tablet by mouth daily      . OMEGA-3 KRILL OIL 300 MG CAPS Take by mouth daily.        . pseudoephedrine-guaifenesin (MUCINEX D) 60-600 MG per tablet Take 1 tablet by mouth every 12 (twelve) hours.      . simvastatin (ZOCOR) 20 MG tablet Take 20 mg by mouth at bedtime.        . sitaGLIPtin (JANUVIA) 100 MG tablet Take 1 tablet (100 mg total) by mouth daily.  30 tablet  11  . traMADol (ULTRAM) 50 MG tablet Take 1 tablet (50 mg total) by mouth every 8 (eight) hours as needed for pain.  90 tablet  3   No facility-administered encounter medications on file as of 05/04/2013.   BP 122/70  Pulse 106  Temp(Src) 99.1 F (37.3 C) (Oral)  Wt 150 lb (68.04 kg)  BMI 28.1 kg/m2  SpO2 95%  Review of Systems  Constitutional: Positive for fever, chills and fatigue. Negative for appetite change and unexpected weight change.  HENT: Positive for congestion, rhinorrhea and postnasal drip. Negative for ear pain, sore throat, trouble swallowing, neck pain, voice change and sinus pressure.   Eyes: Negative for visual disturbance.  Respiratory: Positive for cough and shortness of breath. Negative for  wheezing and stridor.   Cardiovascular: Negative for chest pain, palpitations and leg swelling.  Gastrointestinal: Negative for nausea, vomiting, abdominal pain, diarrhea, constipation, blood in stool, abdominal distention and anal bleeding.  Genitourinary: Negative for dysuria and flank pain.  Musculoskeletal: Negative for myalgias, arthralgias and gait problem.  Skin: Negative for color change and rash.  Neurological: Negative for dizziness and headaches.  Hematological: Negative for adenopathy. Does not bruise/bleed easily.  Psychiatric/Behavioral: Negative for suicidal ideas, sleep disturbance and dysphoric mood. The patient is not nervous/anxious.        Objective:   Physical Exam  Constitutional: She is oriented to person, place, and time. She appears well-developed  and well-nourished. No distress.  HENT:  Head: Normocephalic and atraumatic.  Right Ear: External ear normal.  Left Ear: External ear normal.  Nose: Nose normal.  Mouth/Throat: Oropharynx is clear and moist. No oropharyngeal exudate.  Eyes: Conjunctivae are normal. Pupils are equal, round, and reactive to light. Right eye exhibits no discharge. Left eye exhibits no discharge. No scleral icterus.  Neck: Normal range of motion. Neck supple. No tracheal deviation present. No thyromegaly present.  Cardiovascular: Normal rate, regular rhythm, normal heart sounds and intact distal pulses.  Exam reveals no gallop and no friction rub.   No murmur heard. Pulmonary/Chest: Effort normal. No accessory muscle usage. Not tachypneic. No respiratory distress. She has no decreased breath sounds. She has no wheezes. She has rhonchi. She has no rales. She exhibits no tenderness.  Musculoskeletal: Normal range of motion. She exhibits no edema and no tenderness.  Lymphadenopathy:    She has no cervical adenopathy.  Neurological: She is alert and oriented to person, place, and time. No cranial nerve deficit. She exhibits normal muscle tone. Coordination normal.  Skin: Skin is warm and dry. No rash noted. She is not diaphoretic. No erythema. No pallor.  Psychiatric: She has a normal mood and affect. Her behavior is normal. Judgment and thought content normal.          Assessment & Plan:

## 2013-05-10 ENCOUNTER — Ambulatory Visit (INDEPENDENT_AMBULATORY_CARE_PROVIDER_SITE_OTHER): Payer: Medicare PPO | Admitting: Internal Medicine

## 2013-05-10 ENCOUNTER — Encounter: Payer: Self-pay | Admitting: Internal Medicine

## 2013-05-10 VITALS — BP 148/72 | HR 92 | Temp 98.2°F | Wt 152.0 lb

## 2013-05-10 DIAGNOSIS — J209 Acute bronchitis, unspecified: Secondary | ICD-10-CM

## 2013-05-10 DIAGNOSIS — G250 Essential tremor: Secondary | ICD-10-CM | POA: Insufficient documentation

## 2013-05-10 NOTE — Assessment & Plan Note (Signed)
Symptoms and exam are consistent with benign essential tremor. Discussed certain triggers that may make tremor more prominent such as caffeine intake, stress. Discussed potential medication for tremor. Pt would like to hold off for now.

## 2013-05-10 NOTE — Progress Notes (Signed)
Subjective:    Patient ID: Anita Mercer, female    DOB: 1946/01/11, 67 y.o.   MRN: [id number]  HPI 67 year old female with history of diabetes, hypertension presents for followup after recent episode of bronchitis. She reports that symptoms of productive cough and fever have improved. No further fever since Friday. Cough is now generally nonproductive. She denies shortness of breath. She is no longer taking cough suppressants. She continues to have some fatigue. She has had some mild diarrhea with the Augmentin. No abdominal pain.  She is concerned today about several month history of bilateral hand tremor. She notices this more during times of stress. This is never been evaluated in the past. She does have a history of neuropathy.  Outpatient Encounter Prescriptions as of 05/10/2013  Medication Sig Dispense Refill  . amoxicillin-clavulanate (AUGMENTIN) 875-125 MG per tablet Take 1 tablet by mouth 2 (two) times daily.  20 tablet  0  . aspirin EC 81 MG tablet Take 81 mg by mouth daily.        . Blood Glucose Calibration (OT ULTRA/FASTTK CNTRL SOLN) SOLN 1 drop by In Vitro route once. Please dispense controls for patient to use at home with One Touch Ultra machine. Dx code 250.0  1 each  0  . glucose blood test strip Please dispense One Touch Ultra testing strips for patient to use with home device. Checking blood sugars 2-3 times a day. Dx code 250.0  100 each  12  . insulin glargine (LANTUS) 100 UNIT/ML injection Inject 35 Units into the skin daily.       . insulin lispro (HUMALOG KWIKPEN) 100 UNIT/ML injection Inject into the skin as directed. Use only w/blood sugar greater than >250       . Insulin Pen Needle (B-D UF III MINI PEN NEEDLES) 31G X 5 MM MISC Patient is checking blood sugars at home 2-3 times a day.  Dx code 250.0  100 each  3  . Lancets (ONETOUCH ULTRASOFT) lancets Please dispense One Touch Ultra soft lancet for patient to use at home. Patient is checking at home 2-3 times a day.  Dx  code 250.0  100 each  12  . lisinopril (PRINIVIL,ZESTRIL) 10 MG tablet Take 10 mg by mouth daily.        . metFORMIN (GLUCOPHAGE) 1000 MG tablet Take 1,000 mg by mouth 2 (two) times daily.        . NON FORMULARY Neu Remedy-150mg  Benfoltiamine take one tablet by mouth daily      . OMEGA-3 KRILL OIL 300 MG CAPS Take by mouth daily.        . pseudoephedrine-guaifenesin (MUCINEX D) 60-600 MG per tablet Take 1 tablet by mouth every 12 (twelve) hours.      . simvastatin (ZOCOR) 20 MG tablet Take 20 mg by mouth at bedtime.        . sitaGLIPtin (JANUVIA) 100 MG tablet Take 1 tablet (100 mg total) by mouth daily.  30 tablet  11  . traMADol (ULTRAM) 50 MG tablet Take 1 tablet (50 mg total) by mouth every 8 (eight) hours as needed for pain.  90 tablet  3  . chlorpheniramine-HYDROcodone (TUSSIONEX) 10-8 MG/5ML LQCR Take 5 mLs by mouth every 12 (twelve) hours as needed.  140 mL  0   No facility-administered encounter medications on file as of 05/10/2013.   BP 148/72  Pulse 92  Temp(Src) 98.2 F (36.8 C) (Oral)  Wt 152 lb (68.947 kg)  BMI 28.48 kg/m2  SpO2 98%  Review of Systems  Constitutional: Positive for fatigue. Negative for fever, chills and unexpected weight change.  HENT: Negative for hearing loss, ear pain, nosebleeds, congestion, sore throat, facial swelling, rhinorrhea, sneezing, mouth sores, trouble swallowing, neck pain, neck stiffness, voice change, postnasal drip, sinus pressure, tinnitus and ear discharge.   Eyes: Negative for pain, discharge, redness and visual disturbance.  Respiratory: Positive for cough. Negative for chest tightness, shortness of breath, wheezing and stridor.   Cardiovascular: Negative for chest pain, palpitations and leg swelling.  Musculoskeletal: Negative for myalgias and arthralgias.  Skin: Negative for color change and rash.  Neurological: Positive for tremors. Negative for dizziness, weakness, light-headedness and headaches.  Hematological: Negative for  adenopathy.       Objective:   Physical Exam  Constitutional: She is oriented to person, place, and time. She appears well-developed and well-nourished. No distress.  HENT:  Head: Normocephalic and atraumatic.  Right Ear: External ear normal.  Left Ear: External ear normal.  Nose: Nose normal.  Mouth/Throat: Oropharynx is clear and moist. No oropharyngeal exudate.  Eyes: Conjunctivae are normal. Pupils are equal, round, and reactive to light. Right eye exhibits no discharge. Left eye exhibits no discharge. No scleral icterus.  Neck: Normal range of motion. Neck supple. No tracheal deviation present. No thyromegaly present.  Cardiovascular: Normal rate, regular rhythm, normal heart sounds and intact distal pulses.  Exam reveals no gallop and no friction rub.   No murmur heard. Pulmonary/Chest: Effort normal. No accessory muscle usage. Not tachypneic. No respiratory distress. She has no decreased breath sounds. She has no wheezes. She has rhonchi (few). She has no rales. She exhibits no tenderness.  Musculoskeletal: Normal range of motion. She exhibits no edema and no tenderness.  Lymphadenopathy:    She has no cervical adenopathy.  Neurological: She is alert and oriented to person, place, and time. She displays tremor (bilateral fine hand tremor L>R). No cranial nerve deficit. She exhibits normal muscle tone. Coordination normal.  Skin: Skin is warm and dry. No rash noted. She is not diaphoretic. No erythema. No pallor.  Psychiatric: She has a normal mood and affect. Her behavior is normal. Judgment and thought content normal.          Assessment & Plan:

## 2013-05-10 NOTE — Assessment & Plan Note (Signed)
Symptoms improving with use of Augmentin. Will continue. Patient will call if any persistent cough, shortness of breath, recurrent fever. Discussed that cough and fatigue may take several weeks to completely resolve.

## 2013-05-28 LAB — HM MAMMOGRAPHY

## 2013-06-27 ENCOUNTER — Other Ambulatory Visit: Payer: Self-pay

## 2013-07-10 ENCOUNTER — Encounter: Payer: Self-pay | Admitting: Internal Medicine

## 2013-07-10 ENCOUNTER — Ambulatory Visit (INDEPENDENT_AMBULATORY_CARE_PROVIDER_SITE_OTHER): Payer: Medicare PPO | Admitting: Internal Medicine

## 2013-07-10 VITALS — BP 150/80 | HR 100 | Temp 98.4°F | Wt 151.0 lb

## 2013-07-10 DIAGNOSIS — R300 Dysuria: Secondary | ICD-10-CM

## 2013-07-10 DIAGNOSIS — N390 Urinary tract infection, site not specified: Secondary | ICD-10-CM

## 2013-07-10 DIAGNOSIS — E119 Type 2 diabetes mellitus without complications: Secondary | ICD-10-CM

## 2013-07-10 LAB — POCT URINALYSIS DIPSTICK
Nitrite, UA: NEGATIVE
Protein, UA: NEGATIVE
Spec Grav, UA: >=1.03
Urobilinogen, UA: 0.2

## 2013-07-10 MED ORDER — CIPROFLOXACIN HCL 500 MG PO TABS: 500.0000 mg | ORAL_TABLET | Freq: Two times a day (BID) | ORAL | Status: DC

## 2013-07-10 NOTE — Progress Notes (Signed)
Subjective:    Patient ID: Anita Mercer, female    DOB: 1946-10-28, 67 y.o.   MRN: [id number]  HPI 67 year old female with history of diabetes, hypertension, hyperlipidemia presents for acute visit complaining of several days of increased urinary frequency, urgency, and pain with urination. She denies any fever, chills, flank pain. She has not taken any medication for her symptoms.  She also reports that blood sugars have been more elevated recently. Blood sugars were reaching as high as 395 approximately one week ago. She contacted her endocrinologist recommended increasing her dose of Lantus to 42 units daily. Since making that adjustment, her blood sugars have been improved, typically near 130.  Outpatient Encounter Prescriptions as of 07/10/2013  Medication Sig Dispense Refill  . aspirin EC 81 MG tablet Take 81 mg by mouth daily.        . Blood Glucose Calibration (OT ULTRA/FASTTK CNTRL SOLN) SOLN 1 drop by In Vitro route once. Please dispense controls for patient to use at home with One Touch Ultra machine. Dx code 250.0  1 each  0  . glucose blood test strip Please dispense One Touch Ultra testing strips for patient to use with home device. Checking blood sugars 2-3 times a day. Dx code 250.0  100 each  12  . insulin glargine (LANTUS) 100 UNIT/ML injection Inject 42 Units into the skin daily.       . insulin lispro (HUMALOG KWIKPEN) 100 UNIT/ML injection Inject into the skin as directed. Use only w/blood sugar greater than >250       . Insulin Pen Needle (B-D UF III MINI PEN NEEDLES) 31G X 5 MM MISC Patient is checking blood sugars at home 2-3 times a day.  Dx code 250.0  100 each  3  . Lancets (ONETOUCH ULTRASOFT) lancets Please dispense One Touch Ultra soft lancet for patient to use at home. Patient is checking at home 2-3 times a day.  Dx code 250.0  100 each  12  . lisinopril (PRINIVIL,ZESTRIL) 10 MG tablet Take 10 mg by mouth daily.        . metFORMIN (GLUCOPHAGE) 1000 MG tablet Take  1,000 mg by mouth 2 (two) times daily.        . NON FORMULARY Neu Remedy-150mg  Benfoltiamine take one tablet by mouth daily      . OMEGA-3 KRILL OIL 300 MG CAPS Take by mouth daily.        . simvastatin (ZOCOR) 20 MG tablet Take 20 mg by mouth at bedtime.        . sitaGLIPtin (JANUVIA) 100 MG tablet Take 1 tablet (100 mg total) by mouth daily.  30 tablet  11  . traMADol (ULTRAM) 50 MG tablet Take 1 tablet (50 mg total) by mouth every 8 (eight) hours as needed for pain.  90 tablet  3   No facility-administered encounter medications on file as of 07/10/2013.   BP 150/80  Pulse 100  Temp(Src) 98.4 F (36.9 C) (Oral)  Wt 151 lb (68.493 kg)  BMI 28.29 kg/m2  SpO2 95%  Review of Systems  Constitutional: Negative for fever, chills and fatigue.  Gastrointestinal: Negative for nausea, vomiting, abdominal pain, diarrhea, constipation and rectal pain.  Genitourinary: Positive for dysuria, urgency and frequency. Negative for hematuria, flank pain, decreased urine volume, vaginal bleeding, vaginal discharge, difficulty urinating, vaginal pain and pelvic pain.       Objective:   Physical Exam  Constitutional: She is oriented to person, place, and time. She appears well-developed and  well-nourished. No distress.  HENT:  Head: Normocephalic and atraumatic.  Right Ear: External ear normal.  Left Ear: External ear normal.  Nose: Nose normal.  Mouth/Throat: Oropharynx is clear and moist. No oropharyngeal exudate.  Eyes: Conjunctivae are normal. Pupils are equal, round, and reactive to light. Right eye exhibits no discharge. Left eye exhibits no discharge. No scleral icterus.  Neck: Normal range of motion. Neck supple. No tracheal deviation present. No thyromegaly present.  Cardiovascular: Normal rate, regular rhythm, normal heart sounds and intact distal pulses.  Exam reveals no gallop and no friction rub.   No murmur heard. Pulmonary/Chest: Effort normal and breath sounds normal. No accessory  muscle usage. Not tachypneic. No respiratory distress. She has no decreased breath sounds. She has no wheezes. She has no rhonchi. She has no rales. She exhibits no tenderness.  Abdominal: Soft. Bowel sounds are normal. She exhibits no distension. There is tenderness (right suprapubic, no CVA tenderness).  Musculoskeletal: Normal range of motion. She exhibits no edema and no tenderness.  Lymphadenopathy:    She has no cervical adenopathy.  Neurological: She is alert and oriented to person, place, and time. No cranial nerve deficit. She exhibits normal muscle tone. Coordination normal.  Skin: Skin is warm and dry. No rash noted. She is not diaphoretic. No erythema. No pallor.  Psychiatric: She has a normal mood and affect. Her behavior is normal. Judgment and thought content normal.          Assessment & Plan:

## 2013-07-10 NOTE — Assessment & Plan Note (Signed)
Pt reports BG recently elevated, with most sugars near 130.  Suspect this is related to UTI. Encouraged her to continue Lantus at same dose and follow up as scheduled with her endocrinologist next week.

## 2013-07-10 NOTE — Assessment & Plan Note (Signed)
Symptoms and urinalysis consistent with UTI. Will send urine for culture. Will treat empirically with Cipro. Follow up if symptoms are not improving.

## 2013-07-12 LAB — CULTURE, URINE COMPREHENSIVE

## 2013-07-16 ENCOUNTER — Other Ambulatory Visit: Payer: Self-pay | Admitting: *Deleted

## 2013-07-16 ENCOUNTER — Encounter: Payer: Self-pay | Admitting: *Deleted

## 2013-07-16 DIAGNOSIS — M792 Neuralgia and neuritis, unspecified: Secondary | ICD-10-CM

## 2013-07-16 MED ORDER — TRAMADOL HCL 50 MG PO TABS: 50.0000 mg | ORAL_TABLET | Freq: Three times a day (TID) | ORAL | Status: DC | PRN

## 2013-07-31 ENCOUNTER — Encounter: Payer: Self-pay | Admitting: Internal Medicine

## 2013-08-07 ENCOUNTER — Ambulatory Visit (INDEPENDENT_AMBULATORY_CARE_PROVIDER_SITE_OTHER): Payer: Medicare PPO | Admitting: Internal Medicine

## 2013-08-07 ENCOUNTER — Encounter: Payer: Self-pay | Admitting: Internal Medicine

## 2013-08-07 VITALS — BP 146/80 | HR 97 | Temp 98.5°F | Wt 150.0 lb

## 2013-08-07 DIAGNOSIS — M792 Neuralgia and neuritis, unspecified: Secondary | ICD-10-CM

## 2013-08-07 DIAGNOSIS — Z23 Encounter for immunization: Secondary | ICD-10-CM

## 2013-08-07 DIAGNOSIS — IMO0002 Reserved for concepts with insufficient information to code with codable children: Secondary | ICD-10-CM

## 2013-08-07 DIAGNOSIS — E119 Type 2 diabetes mellitus without complications: Secondary | ICD-10-CM

## 2013-08-07 DIAGNOSIS — I10 Essential (primary) hypertension: Secondary | ICD-10-CM

## 2013-08-07 DIAGNOSIS — R300 Dysuria: Secondary | ICD-10-CM

## 2013-08-07 LAB — POCT URINALYSIS DIPSTICK
Leukocytes, UA: NEGATIVE
Protein, UA: NEGATIVE
Spec Grav, UA: 1.025
Urobilinogen, UA: 0.2

## 2013-08-07 NOTE — Assessment & Plan Note (Signed)
Recent mild dysuria. Urinalysis is normal. Will send urine for culture. Pt will call if symptoms are worsening or new symptoms such as fever, chills, flank pain develop.

## 2013-08-07 NOTE — Assessment & Plan Note (Signed)
BP Readings from Last 3 Encounters:  08/07/13 146/80  07/10/13 150/80  05/10/13 148/72   BP elevated today, however pt very upset about ongoing insurance issues. BP has been well controlled at home per her report. Will continue to monitor. She will call if BP consistently >140/90.

## 2013-08-07 NOTE — Assessment & Plan Note (Addendum)
Blood sugars well controlled per pt report. Will request recent notes from her endocrinologist and A1c report. Continue current medications. Follow up with endocrinology in 3 months and here in 6 months.

## 2013-08-07 NOTE — Progress Notes (Signed)
Subjective:    Patient ID: Anita Mercer, female    DOB: [birth year], 67 y.o.   MRN: [id number]  HPI 67 year old female with history of diabetes, hypertension, hyperlipidemia, peripheral neuropathy presents for followup. She reports that she is generally feeling well. In regards to diabetes, most blood sugars have been between 80 and 130. She denies any blood sugars over 150 or less than 70. She is compliant with medication.  In regards to peripheral neuropathy, she reports that symptoms have recently worsened. However, she tried to reduce use of tramadol because she was concerned about potential addictive properties. In the past she was taking up to 100 mg twice daily of tramadol. Over the last couple of weeks she has reduced that to 50 mg at bedtime. She reports significant increase in the burning sensation in her lower extremities and hands. This has limited her ability to sleep.  She also notes several days of mild dysuria, described as burning sensation. She recently completed cipro for UTI. She denies any fever, chills, flank pain, hematuria.  Outpatient Encounter Prescriptions as of 08/07/2013  Medication Sig Dispense Refill  . aspirin EC 81 MG tablet Take 81 mg by mouth daily.        . Blood Glucose Calibration (OT ULTRA/FASTTK CNTRL SOLN) SOLN 1 drop by In Vitro route once. Please dispense controls for patient to use at home with One Touch Ultra machine. Dx code 250.0  1 each  0  . glucose blood test strip Please dispense One Touch Ultra testing strips for patient to use with home device. Checking blood sugars 2-3 times a day. Dx code 250.0  100 each  12  . insulin glargine (LANTUS) 100 UNIT/ML injection Inject 42 Units into the skin daily.       . insulin lispro (HUMALOG KWIKPEN) 100 UNIT/ML injection Inject into the skin as directed. Use only w/blood sugar greater than >250       . Insulin Pen Needle (B-D UF III MINI PEN NEEDLES) 31G X 5 MM MISC Patient is checking blood sugars at home 2-3  times a day.  Dx code 250.0  100 each  3  . Lancets (ONETOUCH ULTRASOFT) lancets Please dispense One Touch Ultra soft lancet for patient to use at home. Patient is checking at home 2-3 times a day.  Dx code 250.0  100 each  12  . lisinopril (PRINIVIL,ZESTRIL) 10 MG tablet Take 10 mg by mouth daily.        . metFORMIN (GLUCOPHAGE) 1000 MG tablet Take 1,000 mg by mouth 2 (two) times daily.        . OMEGA-3 KRILL OIL 300 MG CAPS Take by mouth daily.        . simvastatin (ZOCOR) 20 MG tablet Take 20 mg by mouth at bedtime.        . sitaGLIPtin (JANUVIA) 100 MG tablet Take 1 tablet (100 mg total) by mouth daily.  30 tablet  11  . traMADol (ULTRAM) 50 MG tablet Take 1 tablet (50 mg total) by mouth every 8 (eight) hours as needed for pain.  90 tablet  3   No facility-administered encounter medications on file as of 08/07/2013.   BP 146/80  Pulse 97  Temp(Src) 98.5 F (36.9 C) (Oral)  Wt 150 lb (68.04 kg)  BMI 28.1 kg/m2  SpO2 96%  Review of Systems  Constitutional: Negative for fever, chills, appetite change, fatigue and unexpected weight change.  HENT: Negative for ear pain, congestion, sore throat, trouble swallowing, neck pain,  voice change and sinus pressure.   Eyes: Negative for visual disturbance.  Respiratory: Negative for cough, shortness of breath, wheezing and stridor.   Cardiovascular: Negative for chest pain, palpitations and leg swelling.  Gastrointestinal: Negative for nausea, vomiting, abdominal pain, diarrhea, constipation, blood in stool, abdominal distention and anal bleeding.  Genitourinary: Negative for dysuria and flank pain.  Musculoskeletal: Positive for myalgias. Negative for arthralgias and gait problem.  Skin: Negative for color change and rash.  Neurological: Negative for dizziness and headaches.  Hematological: Negative for adenopathy. Does not bruise/bleed easily.  Psychiatric/Behavioral: Negative for suicidal ideas, sleep disturbance and dysphoric mood. The  patient is not nervous/anxious.        Objective:   Physical Exam  Constitutional: She is oriented to person, place, and time. She appears well-developed and well-nourished. No distress.  HENT:  Head: Normocephalic and atraumatic.  Right Ear: External ear normal.  Left Ear: External ear normal.  Nose: Nose normal.  Mouth/Throat: Oropharynx is clear and moist. No oropharyngeal exudate.  Eyes: Conjunctivae are normal. Pupils are equal, round, and reactive to light. Right eye exhibits no discharge. Left eye exhibits no discharge. No scleral icterus.  Neck: Normal range of motion. Neck supple. No tracheal deviation present. No thyromegaly present.  Cardiovascular: Normal rate, regular rhythm, normal heart sounds and intact distal pulses.  Exam reveals no gallop and no friction rub.   No murmur heard. Pulmonary/Chest: Effort normal and breath sounds normal. No accessory muscle usage. Not tachypneic. No respiratory distress. She has no decreased breath sounds. She has no wheezes. She has no rhonchi. She has no rales. She exhibits no tenderness.  Musculoskeletal: Normal range of motion. She exhibits no edema and no tenderness.  Lymphadenopathy:    She has no cervical adenopathy.  Neurological: She is alert and oriented to person, place, and time. She displays no atrophy and no tremor. A sensory deficit (decreased sensation to temperature and pinprick BLE) is present. No cranial nerve deficit. She exhibits normal muscle tone. Coordination and gait normal.  Skin: Skin is warm and dry. No rash noted. She is not diaphoretic. No erythema. No pallor.  Psychiatric: She has a normal mood and affect. Her behavior is normal. Judgment and thought content normal.          Assessment & Plan:

## 2013-08-07 NOTE — Assessment & Plan Note (Signed)
Recent worsening with reduction in dose of Tramadol. Encouraged her to use this medication, up to 100mg  po tid prn as it had worked well for her in the past. If symptoms are persistent, discussed adding medication such as Lyrica, Neurontin, or Cymbalta, however pt would like to hold off for now.

## 2013-08-09 ENCOUNTER — Telehealth: Payer: Self-pay | Admitting: Internal Medicine

## 2013-08-09 LAB — CULTURE, URINE COMPREHENSIVE: Colony Count: NO GROWTH

## 2013-08-09 NOTE — Telephone Encounter (Signed)
FYI to Dr. Walker 

## 2013-08-09 NOTE — Telephone Encounter (Signed)
Pt says that Dr. Dan Humphreys said it was ok for Dr. Tedd Sias to give her an RX for lab work and she dropped off the rx. Dr. Tedd Sias wants her to have HgbA1c 250.02 and please fax results over to [phone]. They need them to be drawn after 12.11.14.

## 2013-08-13 LAB — HM DIABETES EYE EXAM

## 2013-08-13 LAB — HM DIABETES FOOT EXAM

## 2013-09-13 ENCOUNTER — Ambulatory Visit: Payer: Medicare PPO | Admitting: Internal Medicine

## 2013-10-04 ENCOUNTER — Other Ambulatory Visit: Payer: Self-pay | Admitting: Internal Medicine

## 2013-10-09 LAB — HM DIABETES EYE EXAM

## 2013-10-24 ENCOUNTER — Other Ambulatory Visit: Payer: Self-pay | Admitting: Internal Medicine

## 2013-10-24 NOTE — Telephone Encounter (Signed)
Refill

## 2013-10-26 ENCOUNTER — Ambulatory Visit (INDEPENDENT_AMBULATORY_CARE_PROVIDER_SITE_OTHER): Payer: Medicare PPO | Admitting: Internal Medicine

## 2013-10-26 ENCOUNTER — Encounter: Payer: Self-pay | Admitting: Internal Medicine

## 2013-10-26 VITALS — BP 158/78 | HR 84 | Temp 98.3°F | Wt 151.0 lb

## 2013-10-26 DIAGNOSIS — N390 Urinary tract infection, site not specified: Secondary | ICD-10-CM

## 2013-10-26 DIAGNOSIS — E119 Type 2 diabetes mellitus without complications: Secondary | ICD-10-CM

## 2013-10-26 LAB — POCT URINALYSIS DIPSTICK
Bilirubin, UA: NEGATIVE
Glucose, UA: NEGATIVE
Nitrite, UA: NEGATIVE

## 2013-10-26 MED ORDER — SULFAMETHOXAZOLE-TMP DS 800-160 MG PO TABS: 1.0000 | ORAL_TABLET | Freq: Two times a day (BID) | ORAL | Status: DC

## 2013-10-26 NOTE — Progress Notes (Signed)
Pre-visit discussion using our clinic review tool. No additional management support is needed unless otherwise documented below in the visit note.  

## 2013-10-27 NOTE — Assessment & Plan Note (Signed)
Blood sugars are generally well controlled per pt report, with fasting BG between 100-150. Encouraged her to keep follow up as scheduled with her endocrinologist for repeat A1c. Continue current medications.

## 2013-10-27 NOTE — Progress Notes (Signed)
Subjective:    Patient ID: Anita Mercer, female    DOB: September 16, 1946, 67 y.o.   MRN: [id number]  HPI 67YO female presents for acute visit c/o 1 week of dysuria, urinary urgency and frequency. She denies fever or chills. She denies hematuria or flank pain. She has been increasing her fluid intake and taking cranberry pills with no improvement.  She is also concerned about elevated blood sugars. She increased her Lantus at the instruction of her endocrinologist, with goal of getting fasting blood sugars below 100. She has only had 2 blood sugars over the last months less than 100. Most fasting blood sugars between 100-150. She is unsure how to proceed. She left word with her endocrinologist, but has not heard back.  Outpatient Encounter Prescriptions as of 10/26/2013  Medication Sig  . aspirin EC 81 MG tablet Take 81 mg by mouth daily.    . Blood Glucose Calibration (OT ULTRA/FASTTK CNTRL SOLN) SOLN 1 drop by In Vitro route once. Please dispense controls for patient to use at home with One Touch Ultra machine. Dx code 250.0  . glucose blood test strip Please dispense One Touch Ultra testing strips for patient to use with home device. Checking blood sugars 2-3 times a day. Dx code 250.0  . insulin glargine (LANTUS) 100 UNIT/ML injection Inject 45 Units into the skin daily.   . insulin lispro (HUMALOG KWIKPEN) 100 UNIT/ML injection Inject into the skin as directed. Use only w/blood sugar greater than >250   . Insulin Pen Needle (B-D UF III MINI PEN NEEDLES) 31G X 5 MM MISC Patient is checking blood sugars at home 2-3 times a day.  Dx code 250.0  . JANUVIA 100 MG tablet Take one tablet by mouth one time daily  . Lancets (ONETOUCH ULTRASOFT) lancets Please dispense One Touch Ultra soft lancet for patient to use at home. Patient is checking at home 2-3 times a day.  Dx code 250.0  . lisinopril (PRINIVIL,ZESTRIL) 10 MG tablet Take 10 mg by mouth daily.    . metFORMIN (GLUCOPHAGE) 1000 MG tablet Take 1,000  mg by mouth 2 (two) times daily.    . OMEGA-3 KRILL OIL 300 MG CAPS Take by mouth daily.    . simvastatin (ZOCOR) 20 MG tablet Take 20 mg by mouth at bedtime.    . traMADol (ULTRAM) 50 MG tablet TAKE ONE TABLET BY MOUTH EVERY 8 HOURS AS NEEDED FOR RELIEF FROM PAIN   BP 158/78  Pulse 84  Temp(Src) 98.3 F (36.8 C) (Oral)  Wt 151 lb (68.493 kg)  SpO2 97%  Review of Systems  Constitutional: Negative for fever, chills, appetite change, fatigue and unexpected weight change.  Eyes: Negative for visual disturbance.  Respiratory: Negative for cough and shortness of breath.   Cardiovascular: Negative for chest pain, palpitations and leg swelling.  Gastrointestinal: Negative for nausea, abdominal pain, diarrhea and abdominal distention.  Genitourinary: Positive for dysuria, urgency and frequency. Negative for hematuria, flank pain and pelvic pain.  Musculoskeletal: Positive for myalgias.  Skin: Negative for color change and rash.  Neurological: Negative for dizziness and headaches.  Hematological: Negative for adenopathy. Does not bruise/bleed easily.  Psychiatric/Behavioral: The patient is nervous/anxious.        Objective:   Physical Exam  Constitutional: She is oriented to person, place, and time. She appears well-developed and well-nourished. No distress.  HENT:  Head: Normocephalic and atraumatic.  Right Ear: External ear normal.  Left Ear: External ear normal.  Nose: Nose normal.  Mouth/Throat:  Oropharynx is clear and moist. No oropharyngeal exudate.  Eyes: Conjunctivae are normal. Pupils are equal, round, and reactive to light. Right eye exhibits no discharge. Left eye exhibits no discharge. No scleral icterus.  Neck: Normal range of motion. Neck supple. No tracheal deviation present. No thyromegaly present.  Cardiovascular: Normal rate, regular rhythm, normal heart sounds and intact distal pulses.  Exam reveals no gallop and no friction rub.   No murmur heard. Pulmonary/Chest:  Effort normal and breath sounds normal. No accessory muscle usage. Not tachypneic. No respiratory distress. She has no decreased breath sounds. She has no wheezes. She has no rhonchi. She has no rales. She exhibits no tenderness.  Abdominal: There is no tenderness (no CVA tenderness).  Musculoskeletal: Normal range of motion. She exhibits no edema and no tenderness.  Lymphadenopathy:    She has no cervical adenopathy.  Neurological: She is alert and oriented to person, place, and time. No cranial nerve deficit. She exhibits normal muscle tone. Coordination normal.  Skin: Skin is warm and dry. No rash noted. She is not diaphoretic. No erythema. No pallor.  Psychiatric: She has a normal mood and affect. Her behavior is normal. Judgment and thought content normal.          Assessment & Plan:

## 2013-10-27 NOTE — Assessment & Plan Note (Signed)
Symptoms and urinalysis consistent with UTI. Will send urine for culture. Will start empiric treatment with Bactrim. Follow up if symptoms are not improving.

## 2013-10-29 LAB — URINE CULTURE

## 2013-11-05 ENCOUNTER — Other Ambulatory Visit (INDEPENDENT_AMBULATORY_CARE_PROVIDER_SITE_OTHER): Payer: Medicare PPO

## 2013-11-05 DIAGNOSIS — E119 Type 2 diabetes mellitus without complications: Secondary | ICD-10-CM

## 2013-11-05 LAB — COMPREHENSIVE METABOLIC PANEL
ALT: 23 U/L (ref 0–35)
AST: 28 U/L (ref 0–37)
Albumin: 4.4 g/dL (ref 3.5–5.2)
Alkaline Phosphatase: 64 U/L (ref 39–117)
BUN: 16 mg/dL (ref 6–23)
Creatinine, Ser: 0.8 mg/dL (ref 0.4–1.2)
Total Bilirubin: 0.7 mg/dL (ref 0.3–1.2)

## 2013-11-06 ENCOUNTER — Other Ambulatory Visit: Payer: Self-pay | Admitting: Internal Medicine

## 2013-11-08 ENCOUNTER — Telehealth: Payer: Self-pay | Admitting: Internal Medicine

## 2013-11-08 NOTE — Telephone Encounter (Signed)
Sent myChart message

## 2013-11-08 NOTE — Telephone Encounter (Signed)
Can you let pt know that I would be happy to manage her diabetes? I received her letter requesting this.

## 2013-11-12 ENCOUNTER — Encounter: Payer: Self-pay | Admitting: Internal Medicine

## 2013-11-12 ENCOUNTER — Ambulatory Visit (INDEPENDENT_AMBULATORY_CARE_PROVIDER_SITE_OTHER): Payer: Medicare PPO | Admitting: Internal Medicine

## 2013-11-12 VITALS — BP 140/60 | HR 92

## 2013-11-12 DIAGNOSIS — E119 Type 2 diabetes mellitus without complications: Secondary | ICD-10-CM

## 2013-11-12 DIAGNOSIS — E785 Hyperlipidemia, unspecified: Secondary | ICD-10-CM

## 2013-11-12 DIAGNOSIS — I10 Essential (primary) hypertension: Secondary | ICD-10-CM

## 2013-11-12 MED ORDER — SIMVASTATIN 20 MG PO TABS: 20.0000 mg | ORAL_TABLET | Freq: Every day | ORAL | Status: DC

## 2013-11-12 MED ORDER — METFORMIN HCL 1000 MG PO TABS: 1000.0000 mg | ORAL_TABLET | Freq: Two times a day (BID) | ORAL | Status: DC

## 2013-11-12 MED ORDER — LISINOPRIL 10 MG PO TABS: 10.0000 mg | ORAL_TABLET | Freq: Every day | ORAL | Status: DC

## 2013-11-12 NOTE — Assessment & Plan Note (Signed)
Lab Results  Component Value Date   LDLCALC 65 03/15/2013   Lipids well controlled on Simvastatin. Will continue.

## 2013-11-12 NOTE — Assessment & Plan Note (Signed)
Lab Results  Component Value Date   HGBA1C 7.8* 11/05/2013   BG fairly well controlled. However note, recently pt has had several courses of prednisone which have elevated BG. Discussed goal of A1c closer to 7%. Encouraged continued effort at healthy diet and regular exercise. Continue current medications.

## 2013-11-12 NOTE — Progress Notes (Signed)
Pre-visit discussion using our clinic review tool. No additional management support is needed unless otherwise documented below in the visit note.  

## 2013-11-12 NOTE — Progress Notes (Signed)
Subjective:    Patient ID: Anita Mercer, female    DOB: 04/10/1946, 67 y.o.   MRN: [id number]  HPI 67 year old female with history of diabetes, hypertension, hyperlipidemia, and peripheral neuropathy presents for followup. She would like to discuss changing her diabetes management from her endocrinologist to our office. She finds it confusing to have 2 physicians helping with the management of her diabetes. She brings record of her blood sugars today which show most fasting blood sugars between 80 and 130. She has to postprandial blood sugars over 200. She is compliant with her medication. Recent A1c was 7.6%. She is up-to-date on her foot exam and eye exam. She is generally feeling well with no other new concerns today.  Outpatient Encounter Prescriptions as of 11/12/2013  Medication Sig  . aspirin EC 81 MG tablet Take 81 mg by mouth daily.    . Blood Glucose Calibration (OT ULTRA/FASTTK CNTRL SOLN) SOLN 1 drop by In Vitro route once. Please dispense controls for patient to use at home with One Touch Ultra machine. Dx code 250.0  . Blood Glucose Calibration (OT ULTRA/FASTTK CNTRL SOLN) SOLN use at home with one touch ultra machine   . glucose blood test strip Please dispense One Touch Ultra testing strips for patient to use with home device. Checking blood sugars 2-3 times a day. Dx code 250.0  . insulin glargine (LANTUS) 100 UNIT/ML injection Inject 45 Units into the skin daily.   . insulin lispro (HUMALOG KWIKPEN) 100 UNIT/ML injection Inject into the skin as directed. Use only w/blood sugar greater than >250   . Insulin Pen Needle (B-D UF III MINI PEN NEEDLES) 31G X 5 MM MISC Patient is checking blood sugars at home 2-3 times a day.  Dx code 250.0  . JANUVIA 100 MG tablet Take one tablet by mouth one time daily  . Lancets (ONETOUCH ULTRASOFT) lancets Please dispense One Touch Ultra soft lancet for patient to use at home. Patient is checking at home 2-3 times a day.  Dx code 250.0  .  lisinopril (PRINIVIL,ZESTRIL) 10 MG tablet Take 1 tablet (10 mg total) by mouth daily.  . metFORMIN (GLUCOPHAGE) 1000 MG tablet Take 1 tablet (1,000 mg total) by mouth 2 (two) times daily.  . OMEGA-3 KRILL OIL 300 MG CAPS Take by mouth daily.    . simvastatin (ZOCOR) 20 MG tablet Take 1 tablet (20 mg total) by mouth at bedtime.  . traMADol (ULTRAM) 50 MG tablet TAKE ONE TABLET BY MOUTH EVERY 8 HOURS AS NEEDED FOR RELIEF FROM PAIN   BP 140/60  Pulse 92  SpO2 97%  Review of Systems  Constitutional: Negative for fever, chills, appetite change, fatigue and unexpected weight change.  HENT: Negative for congestion, ear pain, sinus pressure, sore throat, trouble swallowing and voice change.   Eyes: Negative for visual disturbance.  Respiratory: Negative for cough, shortness of breath, wheezing and stridor.   Cardiovascular: Negative for chest pain, palpitations and leg swelling.  Gastrointestinal: Negative for nausea, vomiting, abdominal pain, diarrhea, constipation, blood in stool, abdominal distention and anal bleeding.  Genitourinary: Negative for dysuria and flank pain.  Musculoskeletal: Negative for arthralgias, gait problem, myalgias and neck pain.  Skin: Negative for color change and rash.  Neurological: Negative for dizziness and headaches.  Hematological: Negative for adenopathy. Does not bruise/bleed easily.  Psychiatric/Behavioral: Negative for suicidal ideas, sleep disturbance and dysphoric mood. The patient is not nervous/anxious.        Objective:   Physical Exam  Constitutional: She  is oriented to person, place, and time. She appears well-developed and well-nourished. No distress.  HENT:  Head: Normocephalic and atraumatic.  Right Ear: External ear normal.  Left Ear: External ear normal.  Nose: Nose normal.  Mouth/Throat: Oropharynx is clear and moist. No oropharyngeal exudate.  Eyes: Conjunctivae are normal. Pupils are equal, round, and reactive to light. Right eye  exhibits no discharge. Left eye exhibits no discharge. No scleral icterus.  Neck: Normal range of motion. Neck supple. No tracheal deviation present. No thyromegaly present.  Cardiovascular: Normal rate, regular rhythm, normal heart sounds and intact distal pulses.  Exam reveals no gallop and no friction rub.   No murmur heard. Pulmonary/Chest: Effort normal and breath sounds normal. No accessory muscle usage. Not tachypneic. No respiratory distress. She has no decreased breath sounds. She has no wheezes. She has no rhonchi. She has no rales. She exhibits no tenderness.  Musculoskeletal: Normal range of motion. She exhibits no edema and no tenderness.  Lymphadenopathy:    She has no cervical adenopathy.  Neurological: She is alert and oriented to person, place, and time. No cranial nerve deficit. She exhibits normal muscle tone. Coordination normal.  Skin: Skin is warm and dry. No rash noted. She is not diaphoretic. No erythema. No pallor.  Psychiatric: She has a normal mood and affect. Her behavior is normal. Judgment and thought content normal.          Assessment & Plan:

## 2013-11-12 NOTE — Assessment & Plan Note (Signed)
  BP Readings from Last 3 Encounters:  11/12/13 140/60  10/26/13 158/78  08/07/13 146/80   BP slightly elevated today, but much lower when checked at home. If persistent elevation in clinic, consider increase lisinopril to 20mg  daily.

## 2013-12-21 ENCOUNTER — Other Ambulatory Visit: Payer: Self-pay | Admitting: Internal Medicine

## 2014-02-21 ENCOUNTER — Ambulatory Visit (INDEPENDENT_AMBULATORY_CARE_PROVIDER_SITE_OTHER): Payer: Medicare PPO | Admitting: Internal Medicine

## 2014-02-21 ENCOUNTER — Encounter: Payer: Self-pay | Admitting: Internal Medicine

## 2014-02-21 VITALS — BP 140/70 | HR 94 | Temp 98.3°F | Wt 152.0 lb

## 2014-02-21 DIAGNOSIS — J309 Allergic rhinitis, unspecified: Secondary | ICD-10-CM

## 2014-02-21 DIAGNOSIS — J302 Other seasonal allergic rhinitis: Secondary | ICD-10-CM

## 2014-02-21 DIAGNOSIS — R519 Headache, unspecified: Secondary | ICD-10-CM

## 2014-02-21 DIAGNOSIS — E119 Type 2 diabetes mellitus without complications: Secondary | ICD-10-CM

## 2014-02-21 DIAGNOSIS — E785 Hyperlipidemia, unspecified: Secondary | ICD-10-CM

## 2014-02-21 DIAGNOSIS — R51 Headache: Secondary | ICD-10-CM

## 2014-02-21 DIAGNOSIS — I10 Essential (primary) hypertension: Secondary | ICD-10-CM

## 2014-02-21 LAB — LIPID PANEL
CHOL/HDL RATIO: 4
Cholesterol: 139 mg/dL (ref 0–200)
HDL: 39 mg/dL — ABNORMAL LOW (ref >39.00)
LDL CALC: 51 mg/dL (ref 0–99)
TRIGLYCERIDES: 244 mg/dL — AB (ref 0.0–149.0)
VLDL: 48.8 mg/dL — ABNORMAL HIGH (ref 0.0–40.0)

## 2014-02-21 LAB — COMPREHENSIVE METABOLIC PANEL
ALT: 32 U/L (ref 0–35)
AST: 26 U/L (ref 0–37)
Albumin: 4.2 g/dL (ref 3.5–5.2)
Alkaline Phosphatase: 61 U/L (ref 39–117)
BUN: 14 mg/dL (ref 6–23)
CHLORIDE: 102 meq/L (ref 96–112)
CO2: 27 meq/L (ref 19–32)
Calcium: 9.7 mg/dL (ref 8.4–10.5)
Creatinine, Ser: 0.7 mg/dL (ref 0.4–1.2)
GFR: 96.11 mL/min (ref >60.00)
Glucose, Bld: 123 mg/dL — ABNORMAL HIGH (ref 70–99)
Potassium: 5.1 mEq/L (ref 3.5–5.1)
SODIUM: 139 meq/L (ref 135–145)
TOTAL PROTEIN: 7.2 g/dL (ref 6.0–8.3)
Total Bilirubin: 0.6 mg/dL (ref 0.3–1.2)

## 2014-02-21 LAB — MICROALBUMIN / CREATININE URINE RATIO
Creatinine,U: 92.5 mg/dL
MICROALB UR: 0.8 mg/dL (ref 0.0–1.9)
Microalb Creat Ratio: 0.9 mg/g (ref 0.0–30.0)

## 2014-02-21 LAB — HEMOGLOBIN A1C: Hgb A1c MFr Bld: 7.9 % — ABNORMAL HIGH (ref 4.6–6.5)

## 2014-02-21 MED ORDER — INSULIN PEN NEEDLE 31G X 5 MM MISC: Status: DC

## 2014-02-21 MED ORDER — FLUTICASONE PROPIONATE 50 MCG/ACT NA SUSP: 2.0000 | Freq: Every day | NASAL | Status: DC

## 2014-02-21 MED ORDER — TRAMADOL HCL 50 MG PO TABS: 50.0000 mg | ORAL_TABLET | Freq: Three times a day (TID) | ORAL | Status: DC | PRN

## 2014-02-21 NOTE — Progress Notes (Signed)
Subjective:    Patient ID: Anita Mercer, female    DOB: 06/18/1946, 68 y.o.   MRN: [id number]  HPI 68YO female presents for follow up.  DM - BG running near 100 mostly fasting. Compliant with meds. No BG >175. Several BG near 70.  Facial pain - Concerned about sinus congestion/pain with pain upper left face earlier this month. Described as sharp pain over entire left face with pressure in her upper teeth. Pain then spread to right face. Took tramadol with no improvement. No fever, chills, cough. Symptoms have now resolved.  Also concerned about recent symptoms of clear nasal drainage, sneezing, and itchy watery eyes. Symptoms are exacerbated by exposure to outdoors, such as when she is mowing her lawn. She is not currently taking anything for this.  Review of Systems  Constitutional: Negative for fever, chills, appetite change, fatigue and unexpected weight change.  HENT: Positive for postnasal drip, rhinorrhea and sneezing. Negative for congestion, ear pain, sinus pressure, sore throat, trouble swallowing and voice change.   Eyes: Negative for visual disturbance.  Respiratory: Negative for cough, shortness of breath, wheezing and stridor.   Cardiovascular: Negative for chest pain, palpitations and leg swelling.  Gastrointestinal: Negative for nausea, vomiting, abdominal pain, diarrhea, constipation, blood in stool, abdominal distention and anal bleeding.  Genitourinary: Negative for dysuria and flank pain.  Musculoskeletal: Negative for arthralgias, gait problem, myalgias and neck pain.  Skin: Negative for color change and rash.  Neurological: Negative for dizziness and headaches.  Hematological: Negative for adenopathy. Does not bruise/bleed easily.  Psychiatric/Behavioral: Negative for suicidal ideas, sleep disturbance and dysphoric mood. The patient is not nervous/anxious.        Objective:    BP 140/70  Pulse 94  Temp(Src) 98.3 F (36.8 C) (Oral)  Wt 152 lb (68.947 kg)  SpO2  99% Physical Exam  Constitutional: She is oriented to person, place, and time. She appears well-developed and well-nourished. No distress.  HENT:  Head: Normocephalic and atraumatic.  Right Ear: External ear normal.  Left Ear: External ear normal.  Nose: Nose normal.  Mouth/Throat: Oropharynx is clear and moist. No oropharyngeal exudate.  Eyes: Conjunctivae are normal. Pupils are equal, round, and reactive to light. Right eye exhibits no discharge. Left eye exhibits no discharge. No scleral icterus.  Neck: Normal range of motion. Neck supple. No tracheal deviation present. No thyromegaly present.  Cardiovascular: Normal rate, regular rhythm, normal heart sounds and intact distal pulses.  Exam reveals no gallop and no friction rub.   No murmur heard. Pulmonary/Chest: Effort normal and breath sounds normal. No accessory muscle usage. Not tachypneic. No respiratory distress. She has no decreased breath sounds. She has no wheezes. She has no rhonchi. She has no rales. She exhibits no tenderness.  Musculoskeletal: Normal range of motion. She exhibits no edema and no tenderness.  Lymphadenopathy:    She has no cervical adenopathy.  Neurological: She is alert and oriented to person, place, and time. No cranial nerve deficit. She exhibits normal muscle tone. Coordination normal.  Skin: Skin is warm and dry. No rash noted. She is not diaphoretic. No erythema. No pallor.  Psychiatric: She has a normal mood and affect. Her behavior is normal. Judgment and thought content normal.          Assessment & Plan:   Problem List Items Addressed This Visit   Diabetes mellitus type 2, controlled - Primary     Will check A1c with labs today. Continue current medications.    Relevant  Medications      Insulin Pen Needle (B-D UF III MINI PEN NEEDLES) 31G X 5 MM MISC   Other Relevant Orders      Comprehensive metabolic panel      Hemoglobin A1c      Lipid panel      Microalbumin / creatinine urine ratio     Facial pain     Symptoms seems most consistent with trigeminal neuralgia, rather than sinusitis. Encouraged pt to call if recurrent symptoms so that we can evaluate further.     Hyperlipidemia     Will check lipids and lfts with labs today.    Hypertension      BP Readings from Last 3 Encounters:  02/21/14 140/70  11/12/13 140/60  10/26/13 158/78   BP generally well controlled on current medication. Will continue.    Seasonal allergies     Recent symptoms of sneezing and rhinorrhea. Will start Claritin 10mg  daily and use Flonase. Follow up prn.     Other Visit Diagnoses   Allergic rhinitis        Relevant Medications       fluticasone (FLONASE) 50 MCG nasal spray        Return in about 3 months (around 05/23/2014) for Recheck of Diabetes.

## 2014-02-21 NOTE — Assessment & Plan Note (Signed)
Will check lipids and lfts with labs today. 

## 2014-02-21 NOTE — Patient Instructions (Signed)
During the spring, take Claritin 10mg  prior to outdoor activities.  Start Flonase nasal spray to help control allergy symptoms.

## 2014-02-21 NOTE — Progress Notes (Signed)
Pre visit review using our clinic review tool, if applicable. No additional management support is needed unless otherwise documented below in the visit note. 

## 2014-02-21 NOTE — Assessment & Plan Note (Signed)
Symptoms seems most consistent with trigeminal neuralgia, rather than sinusitis. Encouraged pt to call if recurrent symptoms so that we can evaluate further.

## 2014-02-21 NOTE — Assessment & Plan Note (Signed)
BP Readings from Last 3 Encounters:  02/21/14 140/70  11/12/13 140/60  10/26/13 158/78   BP generally well controlled on current medication. Will continue.

## 2014-02-21 NOTE — Assessment & Plan Note (Signed)
Will check A1c with labs today. Continue current medications. 

## 2014-02-21 NOTE — Assessment & Plan Note (Signed)
Recent symptoms of sneezing and rhinorrhea. Will start Claritin 10mg  daily and use Flonase. Follow up prn.

## 2014-02-28 ENCOUNTER — Encounter: Payer: Self-pay | Admitting: Adult Health

## 2014-02-28 ENCOUNTER — Ambulatory Visit (INDEPENDENT_AMBULATORY_CARE_PROVIDER_SITE_OTHER): Payer: Medicare PPO | Admitting: Adult Health

## 2014-02-28 VITALS — BP 120/56 | HR 98 | Temp 98.8°F | Resp 12 | Wt 152.0 lb

## 2014-02-28 DIAGNOSIS — R300 Dysuria: Secondary | ICD-10-CM

## 2014-02-28 DIAGNOSIS — N390 Urinary tract infection, site not specified: Secondary | ICD-10-CM

## 2014-02-28 LAB — POCT URINALYSIS DIPSTICK
Bilirubin, UA: NEGATIVE
GLUCOSE UA: 250
NITRITE UA: NEGATIVE
PH UA: 5
Protein, UA: NEGATIVE
SPEC GRAV UA: 1.025
Urobilinogen, UA: 0.2

## 2014-02-28 MED ORDER — SULFAMETHOXAZOLE-TMP DS 800-160 MG PO TABS: 1.0000 | ORAL_TABLET | Freq: Two times a day (BID) | ORAL | Status: DC

## 2014-02-28 NOTE — Progress Notes (Signed)
Pre visit review using our clinic review tool, if applicable. No additional management support is needed unless otherwise documented below in the visit note. 

## 2014-02-28 NOTE — Progress Notes (Signed)
Subjective:    Patient ID: Anita Mercer, female    DOB: Sep 01, 1946, 68 y.o.   MRN: [id number]  HPI  Patient is a pleasant 68 year old female who presents to clinic with dysuria, frequency. Symptoms began approximately 2 days ago. She denies hematuria, fever or chills.  Current Outpatient Prescriptions on File Prior to Visit  Medication Sig Dispense Refill  . aspirin EC 81 MG tablet Take 81 mg by mouth daily.        . Blood Glucose Calibration (OT ULTRA/FASTTK CNTRL SOLN) SOLN 1 drop by In Vitro route once. Please dispense controls for patient to use at home with One Touch Ultra machine. Dx code 250.0  1 each  0  . Blood Glucose Calibration (OT ULTRA/FASTTK CNTRL SOLN) SOLN use at home with one touch ultra machine   1 each  1  . Cranberry 500 MG CAPS Take by mouth.      . fluticasone (FLONASE) 50 MCG/ACT nasal spray Place 2 sprays into both nostrils daily.  16 g  6  . glucose blood test strip Please dispense One Touch Ultra testing strips for patient to use with home device. Checking blood sugars 2-3 times a day. Dx code 250.0  100 each  12  . insulin glargine (LANTUS) 100 UNIT/ML injection Inject 45 Units into the skin daily.       . insulin lispro (HUMALOG KWIKPEN) 100 UNIT/ML injection Inject into the skin as directed. Use only w/blood sugar greater than >250       . Insulin Pen Needle (B-D UF III MINI PEN NEEDLES) 31G X 5 MM MISC Patient is checking blood sugars at home 2-3 times a day.  Dx code 250.0  100 each  3  . JANUVIA 100 MG tablet Take one tablet by mouth one time daily  30 tablet  5  . Lancets (ONETOUCH ULTRASOFT) lancets Please dispense One Touch Ultra soft lancet for patient to use at home. Patient is checking at home 2-3 times a day.  Dx code 250.0  100 each  12  . lisinopril (PRINIVIL,ZESTRIL) 10 MG tablet Take 1 tablet (10 mg total) by mouth daily.  90 tablet  3  . metFORMIN (GLUCOPHAGE) 1000 MG tablet Take 1 tablet (1,000 mg total) by mouth 2 (two) times daily.  180 tablet   3  . OMEGA-3 KRILL OIL 300 MG CAPS Take by mouth daily.        . simvastatin (ZOCOR) 20 MG tablet Take 1 tablet (20 mg total) by mouth at bedtime.  90 tablet  3  . traMADol (ULTRAM) 50 MG tablet Take 1 tablet (50 mg total) by mouth 3 (three) times daily as needed.  90 tablet  2   No current facility-administered medications on file prior to visit.     Review of Systems  Constitutional: Negative for fever and chills.  Genitourinary: Positive for dysuria, urgency and frequency. Negative for hematuria.  All other systems reviewed and are negative.      Objective:   Physical Exam  Constitutional: She is oriented to person, place, and time. No distress.  Cardiovascular: Normal rate and regular rhythm.   Pulmonary/Chest: Effort normal. No respiratory distress.  Musculoskeletal: Normal range of motion.  Neurological: She is alert and oriented to person, place, and time.  Skin: Skin is warm and dry.  Psychiatric: She has a normal mood and affect. Her behavior is normal. Judgment and thought content normal.       Assessment & Plan:   1.  Dysuria Urinalysis shows small leukocytes, no nitrites, trace blood. Send urine for culture. Start Bactrim twice a day x7 days. Return to clinic if no improvement within 3-4 days. - POCT urinalysis dipstick - POCT Urinalysis Dipstick - Urine culture

## 2014-03-02 LAB — URINE CULTURE: Colony Count: >=100000

## 2014-03-04 ENCOUNTER — Encounter: Payer: Self-pay | Admitting: Adult Health

## 2014-03-06 ENCOUNTER — Encounter: Payer: Self-pay | Admitting: *Deleted

## 2014-03-11 ENCOUNTER — Telehealth: Payer: Self-pay

## 2014-03-11 NOTE — Telephone Encounter (Signed)
Relevant patient education assigned to patient using Emmi. ° °

## 2014-04-04 ENCOUNTER — Other Ambulatory Visit: Payer: Self-pay | Admitting: Internal Medicine

## 2014-04-12 ENCOUNTER — Other Ambulatory Visit: Payer: Self-pay | Admitting: *Deleted

## 2014-04-12 ENCOUNTER — Telehealth: Payer: Self-pay | Admitting: *Deleted

## 2014-04-12 MED ORDER — SITAGLIPTIN PHOSPHATE 100 MG PO TABS: ORAL_TABLET | ORAL | Status: DC

## 2014-04-12 NOTE — Telephone Encounter (Signed)
done

## 2014-04-12 NOTE — Telephone Encounter (Signed)
Pharmacy Note:  Januvia 100mg    Request for 90 day

## 2014-05-28 ENCOUNTER — Encounter: Payer: Self-pay | Admitting: Internal Medicine

## 2014-05-28 ENCOUNTER — Ambulatory Visit (INDEPENDENT_AMBULATORY_CARE_PROVIDER_SITE_OTHER): Payer: Medicare PPO | Admitting: Internal Medicine

## 2014-05-28 VITALS — BP 122/62 | HR 80 | Temp 97.9°F | Ht 61.25 in | Wt 150.5 lb

## 2014-05-28 DIAGNOSIS — H9193 Unspecified hearing loss, bilateral: Secondary | ICD-10-CM

## 2014-05-28 DIAGNOSIS — Z1239 Encounter for other screening for malignant neoplasm of breast: Secondary | ICD-10-CM

## 2014-05-28 DIAGNOSIS — E119 Type 2 diabetes mellitus without complications: Secondary | ICD-10-CM

## 2014-05-28 DIAGNOSIS — E785 Hyperlipidemia, unspecified: Secondary | ICD-10-CM

## 2014-05-28 DIAGNOSIS — I10 Essential (primary) hypertension: Secondary | ICD-10-CM

## 2014-05-28 DIAGNOSIS — R300 Dysuria: Secondary | ICD-10-CM

## 2014-05-28 DIAGNOSIS — H9190 Unspecified hearing loss, unspecified ear: Secondary | ICD-10-CM

## 2014-05-28 LAB — COMPREHENSIVE METABOLIC PANEL
ALT: 18 U/L (ref 0–35)
AST: 25 U/L (ref 0–37)
Albumin: 4.2 g/dL (ref 3.5–5.2)
Alkaline Phosphatase: 63 U/L (ref 39–117)
BUN: 16 mg/dL (ref 6–23)
CALCIUM: 9.8 mg/dL (ref 8.4–10.5)
CHLORIDE: 101 meq/L (ref 96–112)
CO2: 28 mEq/L (ref 19–32)
Creatinine, Ser: 0.7 mg/dL (ref 0.4–1.2)
GFR: 94.36 mL/min (ref >60.00)
Glucose, Bld: 93 mg/dL (ref 70–99)
POTASSIUM: 4.4 meq/L (ref 3.5–5.1)
Sodium: 137 mEq/L (ref 135–145)
Total Bilirubin: 0.4 mg/dL (ref 0.2–1.2)
Total Protein: 7.5 g/dL (ref 6.0–8.3)

## 2014-05-28 LAB — POCT URINALYSIS DIPSTICK
Bilirubin, UA: NEGATIVE
Glucose, UA: NEGATIVE
Ketones, UA: NEGATIVE
Leukocytes, UA: NEGATIVE
Nitrite, UA: NEGATIVE
PROTEIN UA: NEGATIVE
RBC UA: NEGATIVE
Urobilinogen, UA: 0.2
pH, UA: 5

## 2014-05-28 LAB — LIPID PANEL
CHOL/HDL RATIO: 3
CHOLESTEROL: 116 mg/dL (ref 0–200)
HDL: 34.7 mg/dL — ABNORMAL LOW (ref >39.00)
LDL CALC: 51 mg/dL (ref 0–99)
NonHDL: 81.3
Triglycerides: 154 mg/dL — ABNORMAL HIGH (ref 0.0–149.0)
VLDL: 30.8 mg/dL (ref 0.0–40.0)

## 2014-05-28 LAB — MICROALBUMIN / CREATININE URINE RATIO
Creatinine,U: 201.5 mg/dL
MICROALB UR: 1 mg/dL (ref 0.0–1.9)
Microalb Creat Ratio: 0.5 mg/g (ref 0.0–30.0)

## 2014-05-28 LAB — HEMOGLOBIN A1C: Hgb A1c MFr Bld: 7.1 % — ABNORMAL HIGH (ref 4.6–6.5)

## 2014-05-28 MED ORDER — INSULIN GLARGINE 100 UNIT/ML ~~LOC~~ SOLN: 45.0000 [IU] | Freq: Every day | SUBCUTANEOUS | Status: DC

## 2014-05-28 MED ORDER — TRAMADOL HCL 50 MG PO TABS: 50.0000 mg | ORAL_TABLET | Freq: Three times a day (TID) | ORAL | Status: DC | PRN

## 2014-05-28 NOTE — Progress Notes (Signed)
Pre visit review using our clinic review tool, if applicable. No additional management support is needed unless otherwise documented below in the visit note. 

## 2014-05-28 NOTE — Assessment & Plan Note (Signed)
BP Readings from Last 3 Encounters:  05/28/14 122/62  02/28/14 120/56  02/21/14 140/70   BP well controlled on lisinopril. Will check renal function with labs.

## 2014-05-28 NOTE — Assessment & Plan Note (Signed)
Will check lipids and LFTs with labs. Continue Simvastatin. 

## 2014-05-28 NOTE — Assessment & Plan Note (Signed)
Recent mild dysuria. UA normal. Will send urine for culture.

## 2014-05-28 NOTE — Assessment & Plan Note (Signed)
BG well controlled per report. Will check A1c with labs. Continue current medications. Foot exam normal today.

## 2014-05-28 NOTE — Assessment & Plan Note (Signed)
Mild hearing loss noted by pt. Exam normal. Recommended evaluation with ENT. Pt prefers to hold off on referral for now.

## 2014-05-28 NOTE — Progress Notes (Signed)
Subjective:    Patient ID: Anita Mercer, female    DOB: [birth year], 68 y.o.   MRN: [id number]  HPI 68YO female presents for follow up.  DM - Fasting 60-100 mostly. Post prandial mostly 100-160s, few near 200.  Neuropathy - Taking 2 Tramadol 50mg  at bedtime with improvement in pain and ability to sleep. Occasionally, third pill near 3am.  Feels that hearing is "off" with increased pressure bilateral ears. Questions sinus infection. No sinus drainage or sinus pressure.  Dysuria - burning with urination. No fever, chills, flank pain.  Review of Systems  Constitutional: Negative for fever, chills, appetite change, fatigue and unexpected weight change.  Eyes: Negative for visual disturbance.  Respiratory: Negative for shortness of breath.   Cardiovascular: Negative for chest pain and leg swelling.  Gastrointestinal: Positive for diarrhea (once episode since last visit). Negative for nausea, abdominal pain and constipation.  Genitourinary: Positive for dysuria. Negative for urgency, frequency, hematuria, flank pain and decreased urine volume.  Musculoskeletal: Positive for myalgias. Negative for arthralgias.  Skin: Negative for color change and rash.  Neurological: Negative for weakness and numbness.  Hematological: Negative for adenopathy. Does not bruise/bleed easily.  Psychiatric/Behavioral: Negative for dysphoric mood. The patient is not nervous/anxious.        Objective:    BP 122/62  Pulse 80  Temp(Src) 97.9 F (36.6 C) (Oral)  Ht 5' 1.25" (1.556 m)  Wt 150 lb 8 oz (68.266 kg)  BMI 28.20 kg/m2  SpO2 97% Physical Exam  Constitutional: She is oriented to person, place, and time. She appears well-developed and well-nourished. No distress.  HENT:  Head: Normocephalic and atraumatic.  Right Ear: External ear normal.  Left Ear: External ear normal.  Nose: Nose normal.  Mouth/Throat: Oropharynx is clear and moist. No oropharyngeal exudate.  Eyes: Conjunctivae are normal.  Pupils are equal, round, and reactive to light. Right eye exhibits no discharge. Left eye exhibits no discharge. No scleral icterus.  Neck: Normal range of motion. Neck supple. No tracheal deviation present. No thyromegaly present.  Cardiovascular: Normal rate, regular rhythm, normal heart sounds and intact distal pulses.  Exam reveals no gallop and no friction rub.   No murmur heard. Pulmonary/Chest: Effort normal and breath sounds normal. No accessory muscle usage. Not tachypneic. No respiratory distress. She has no decreased breath sounds. She has no wheezes. She has no rhonchi. She has no rales. She exhibits no tenderness.  Musculoskeletal: Normal range of motion. She exhibits no edema and no tenderness.  Lymphadenopathy:    She has no cervical adenopathy.  Neurological: She is alert and oriented to person, place, and time. No cranial nerve deficit. She exhibits normal muscle tone. Coordination normal.  Skin: Skin is warm and dry. No rash noted. She is not diaphoretic. No erythema. No pallor.  Psychiatric: She has a normal mood and affect. Her behavior is normal. Judgment and thought content normal.          Assessment & Plan:   Problem List Items Addressed This Visit     Unprioritized   Diabetes mellitus type 2, controlled - Primary     BG well controlled per report. Will check A1c with labs. Continue current medications. Foot exam normal today.    Relevant Medications      insulin glargine (LANTUS) 100 UNIT/ML injection   Other Relevant Orders      Comprehensive metabolic panel      Hemoglobin A1c      Lipid panel      Microalbumin /  creatinine urine ratio   Dysuria     Recent mild dysuria. UA normal. Will send urine for culture.    Relevant Orders      POCT Urinalysis Dipstick (Completed)      CULTURE, URINE COMPREHENSIVE   Hearing loss     Mild hearing loss noted by pt. Exam normal. Recommended evaluation with ENT. Pt prefers to hold off on referral for now.     Hyperlipidemia     Will check lipids and LFTs with labs. Continue Simvastatin.    Hypertension      BP Readings from Last 3 Encounters:  05/28/14 122/62  02/28/14 120/56  02/21/14 140/70   BP well controlled on lisinopril. Will check renal function with labs.    Screening for breast cancer   Relevant Orders      MM Digital Screening       Return in about 3 months (around 08/28/2014) for Wellness Visit.

## 2014-05-28 NOTE — Patient Instructions (Signed)
Labs today.  Continue current medications.  Follow up for Wellness Visit in 3 months.

## 2014-05-29 ENCOUNTER — Other Ambulatory Visit: Payer: Self-pay | Admitting: Internal Medicine

## 2014-05-29 ENCOUNTER — Encounter: Payer: Self-pay | Admitting: Internal Medicine

## 2014-05-29 ENCOUNTER — Telehealth: Payer: Self-pay | Admitting: Internal Medicine

## 2014-05-29 NOTE — Telephone Encounter (Signed)
Relevant patient education assigned to patient using Emmi. ° °

## 2014-05-30 LAB — CULTURE, URINE COMPREHENSIVE
Colony Count: NO GROWTH
ORGANISM ID, BACTERIA: NO GROWTH

## 2014-06-28 LAB — HM DIABETES EYE EXAM

## 2014-07-15 ENCOUNTER — Encounter: Payer: Self-pay | Admitting: Adult Health

## 2014-07-15 ENCOUNTER — Ambulatory Visit (INDEPENDENT_AMBULATORY_CARE_PROVIDER_SITE_OTHER): Payer: Medicare PPO | Admitting: Adult Health

## 2014-07-15 VITALS — BP 129/79 | HR 89 | Temp 98.3°F | Resp 14 | Ht 61.25 in | Wt 151.8 lb

## 2014-07-15 DIAGNOSIS — R300 Dysuria: Secondary | ICD-10-CM

## 2014-07-15 DIAGNOSIS — Z1239 Encounter for other screening for malignant neoplasm of breast: Secondary | ICD-10-CM

## 2014-07-15 LAB — POCT URINALYSIS DIPSTICK
Bilirubin, UA: NEGATIVE
GLUCOSE UA: NEGATIVE
Ketones, UA: NEGATIVE
Nitrite, UA: NEGATIVE
Protein, UA: NEGATIVE
RBC UA: NEGATIVE
Spec Grav, UA: 1.015
UROBILINOGEN UA: 0.2
pH, UA: 5.5

## 2014-07-15 MED ORDER — CIPROFLOXACIN HCL 250 MG PO TABS: 250.0000 mg | ORAL_TABLET | Freq: Two times a day (BID) | ORAL | Status: DC

## 2014-07-15 NOTE — Patient Instructions (Addendum)
  Start Cipro 250 mg twice a day for 7 days. You may try over the counter AZO or Urostat for the symptoms associated with UTI I am sending the specimen for culture. We will contact you once the results are available.   Urinary Tract Infection Urinary tract infections (UTIs) can develop anywhere along your urinary tract. Your urinary tract is your body's drainage system for removing wastes and extra water. Your urinary tract includes two kidneys, two ureters, a bladder, and a urethra. Your kidneys are a pair of bean-shaped organs. Each kidney is about the size of your fist. They are located below your ribs, one on each side of your spine. CAUSES Infections are caused by microbes, which are microscopic organisms, including fungi, viruses, and bacteria. These organisms are so small that they can only be seen through a microscope. Bacteria are the microbes that most commonly cause UTIs. SYMPTOMS  Symptoms of UTIs may vary by age and gender of the patient and by the location of the infection. Symptoms in young women typically include a frequent and intense urge to urinate and a painful, burning feeling in the bladder or urethra during urination. Older women and men are more likely to be tired, shaky, and weak and have muscle aches and abdominal pain. A fever may mean the infection is in your kidneys. Other symptoms of a kidney infection include pain in your back or sides below the ribs, nausea, and vomiting. DIAGNOSIS To diagnose a UTI, your caregiver will ask you about your symptoms. Your caregiver also will ask to provide a urine sample. The urine sample will be tested for bacteria and white blood cells. White blood cells are made by your body to help fight infection. TREATMENT  Typically, UTIs can be treated with medication. Because most UTIs are caused by a bacterial infection, they usually can be treated with the use of antibiotics. The choice of antibiotic and length of treatment depend on your  symptoms and the type of bacteria causing your infection. HOME CARE INSTRUCTIONS  If you were prescribed antibiotics, take them exactly as your caregiver instructs you. Finish the medication even if you feel better after you have only taken some of the medication.  Drink enough water and fluids to keep your urine clear or pale yellow.  Avoid caffeine, tea, and carbonated beverages. They tend to irritate your bladder.  Empty your bladder often. Avoid holding urine for long periods of time.  Empty your bladder before and after sexual intercourse.  After a bowel movement, women should cleanse from front to back. Use each tissue only once. SEEK MEDICAL CARE IF:   You have back pain.  You develop a fever.  Your symptoms do not begin to resolve within 3 days. SEEK IMMEDIATE MEDICAL CARE IF:   You have severe back pain or lower abdominal pain.  You develop chills.  You have nausea or vomiting.  You have continued burning or discomfort with urination. MAKE SURE YOU:   Understand these instructions.  Will watch your condition.  Will get help right away if you are not doing well or get worse. Document Released: 08/18/2005 Document Revised: 05/09/2012 Document Reviewed: 12/17/2011 [facility] Patient Information 2015 [place], [place]. This information is not intended to replace advice given to you by your health care provider. Make sure you discuss any questions you have with your health care provider.

## 2014-07-15 NOTE — Progress Notes (Signed)
Pre visit review using our clinic review tool, if applicable. No additional management support is needed unless otherwise documented below in the visit note. 

## 2014-07-15 NOTE — Progress Notes (Signed)
Patient ID: Anita Mercer, female   DOB: [birth year], 68 y.o.   MRN: [id number]   Subjective:    Patient ID: Anita Mercer, female    DOB: 1946-03-22, 68 y.o.   MRN: [id number]  HPI  Pt is a pleasant 68 y/o female who presents with dysuria, foul odor of urine x 1 week. She reports waiting to make sure it did not go away. On Friday she was feeling that she needed to be seen but was unable to get an appointment and did not want to go to [facility] or [facility] so she waited until today. No OTC products were tried. Reports early chills but no longer having this.  She reports that she is due for her mammogram. Has them done at [place]. Will have her schedule today prior to leaving the office.  Past Medical History  Diagnosis Date  . RMSF [facility] spotted fever)   . Diabetes mellitus 2000    Current Outpatient Prescriptions on File Prior to Visit  Medication Sig Dispense Refill  . aspirin EC 81 MG tablet Take 81 mg by mouth daily.        . Blood Glucose Calibration (OT ULTRA/FASTTK CNTRL SOLN) SOLN 1 drop by In Vitro route once. Please dispense controls for patient to use at home with One Touch Ultra machine. Dx code 250.0  1 each  0  . fluticasone (FLONASE) 50 MCG/ACT nasal spray Place 2 sprays into both nostrils daily.  16 g  6  . glucose blood test strip Please dispense One Touch Ultra testing strips for patient to use with home device. Checking blood sugars 2-3 times a day. Dx code 250.0  100 each  12  . insulin glargine (LANTUS) 100 UNIT/ML injection Inject 0.45 mLs (45 Units total) into the skin daily.  10 mL  11  . insulin lispro (HUMALOG KWIKPEN) 100 UNIT/ML injection Inject into the skin as directed. Use only w/blood sugar greater than >250       . Insulin Pen Needle (B-D UF III MINI PEN NEEDLES) 31G X 5 MM MISC Patient is checking blood sugars at home 2-3 times a day.  Dx code 250.0  100 each  3  . Lancets (ONETOUCH ULTRASOFT) lancets CHECK BLOOD SUGAR 2-3 TIMES PER DAY    100 each  11  . lisinopril (PRINIVIL,ZESTRIL) 10 MG tablet Take 1 tablet (10 mg total) by mouth daily.  90 tablet  3  . metFORMIN (GLUCOPHAGE) 1000 MG tablet Take 1 tablet (1,000 mg total) by mouth 2 (two) times daily.  180 tablet  3  . OMEGA-3 KRILL OIL 300 MG CAPS Take by mouth daily.        . simvastatin (ZOCOR) 20 MG tablet Take 1 tablet (20 mg total) by mouth at bedtime.  90 tablet  3  . sitaGLIPtin (JANUVIA) 100 MG tablet TAKE ONE TABLET BY MOUTH ONE TIME DAILY  90 tablet  1  . traMADol (ULTRAM) 50 MG tablet Take 1 tablet (50 mg total) by mouth 3 (three) times daily as needed.  90 tablet  2   No current facility-administered medications on file prior to visit.     Review of Systems  Constitutional: Positive for chills (early last week). Negative for fever.  Genitourinary: Positive for dysuria, urgency and frequency. Negative for hematuria and flank pain.       Objective:  BP 129/79  Pulse 89  Temp(Src) 98.3 F (36.8 C) (Oral)  Resp 14  Wt 151 lb 12 oz (  68.833 kg)  SpO2 100%   Physical Exam  Constitutional: She is oriented to person, place, and time. No distress.  Cardiovascular: Normal rate and regular rhythm.   Pulmonary/Chest: Effort normal. No respiratory distress.  Genitourinary:  No costovertebral angle tenderness  Musculoskeletal: Normal range of motion.  Neurological: She is alert and oriented to person, place, and time.  Skin: Skin is warm and dry.  Psychiatric: She has a normal mood and affect. Her behavior is normal. Judgment and thought content normal.      Assessment & Plan:   1. Dysuria Start Cipro 250 mg twice a day for 7 days. You may try over the counter AZO or Urostat for the symptoms associated with UTI I am sending the specimen for culture. We will contact you once the results are available.  - POCT urinalysis dipstick - CULTURE, URINE COMPREHENSIVE  2. Screening for breast cancer Order for mammogram

## 2014-07-19 ENCOUNTER — Encounter: Payer: Self-pay | Admitting: Adult Health

## 2014-07-19 LAB — CULTURE, URINE COMPREHENSIVE

## 2014-07-22 ENCOUNTER — Encounter: Payer: Self-pay | Admitting: Internal Medicine

## 2014-07-22 ENCOUNTER — Telehealth: Payer: Self-pay | Admitting: *Deleted

## 2014-07-22 NOTE — Telephone Encounter (Signed)
Letter mailed

## 2014-07-22 NOTE — Telephone Encounter (Signed)
Pt called requesting to be excused from [employer].  Report date 9.17.15; Juror number [id number].  Pt states due to her Neuropathy she would not be able to sit through jury Duty.  Please advise

## 2014-07-22 NOTE — Telephone Encounter (Signed)
I have printed her a letter to excuse her from jury duty.

## 2014-07-24 LAB — HM MAMMOGRAPHY

## 2014-08-28 ENCOUNTER — Ambulatory Visit (INDEPENDENT_AMBULATORY_CARE_PROVIDER_SITE_OTHER): Payer: Medicare PPO | Admitting: Internal Medicine

## 2014-08-28 ENCOUNTER — Encounter: Payer: Self-pay | Admitting: Internal Medicine

## 2014-08-28 VITALS — BP 126/66 | HR 88 | Temp 98.0°F | Ht 62.0 in | Wt 149.5 lb

## 2014-08-28 DIAGNOSIS — E119 Type 2 diabetes mellitus without complications: Secondary | ICD-10-CM

## 2014-08-28 DIAGNOSIS — I10 Essential (primary) hypertension: Secondary | ICD-10-CM

## 2014-08-28 DIAGNOSIS — Z0000 Encounter for general adult medical examination without abnormal findings: Secondary | ICD-10-CM

## 2014-08-28 DIAGNOSIS — M792 Neuralgia and neuritis, unspecified: Secondary | ICD-10-CM

## 2014-08-28 DIAGNOSIS — E785 Hyperlipidemia, unspecified: Secondary | ICD-10-CM

## 2014-08-28 DIAGNOSIS — Z23 Encounter for immunization: Secondary | ICD-10-CM

## 2014-08-28 LAB — COMPREHENSIVE METABOLIC PANEL
ALT: 21 U/L (ref 0–35)
AST: 26 U/L (ref 0–37)
Albumin: 3.9 g/dL (ref 3.5–5.2)
Alkaline Phosphatase: 66 U/L (ref 39–117)
BUN: 17 mg/dL (ref 6–23)
CO2: 25 meq/L (ref 19–32)
CREATININE: 0.7 mg/dL (ref 0.4–1.2)
Calcium: 9.9 mg/dL (ref 8.4–10.5)
Chloride: 101 mEq/L (ref 96–112)
GFR: 94.29 mL/min (ref >60.00)
Glucose, Bld: 118 mg/dL — ABNORMAL HIGH (ref 70–99)
Potassium: 4.5 mEq/L (ref 3.5–5.1)
SODIUM: 138 meq/L (ref 135–145)
TOTAL PROTEIN: 8 g/dL (ref 6.0–8.3)
Total Bilirubin: 0.6 mg/dL (ref 0.2–1.2)

## 2014-08-28 LAB — MICROALBUMIN / CREATININE URINE RATIO
Creatinine,U: 141.6 mg/dL
MICROALB/CREAT RATIO: 0.9 mg/g (ref 0.0–30.0)
Microalb, Ur: 1.3 mg/dL (ref 0.0–1.9)

## 2014-08-28 LAB — LIPID PANEL
Cholesterol: 131 mg/dL (ref 0–200)
HDL: 33.3 mg/dL — ABNORMAL LOW (ref >39.00)
LDL Cholesterol: 60 mg/dL (ref 0–99)
NONHDL: 97.7
Total CHOL/HDL Ratio: 4
Triglycerides: 187 mg/dL — ABNORMAL HIGH (ref 0.0–149.0)
VLDL: 37.4 mg/dL (ref 0.0–40.0)

## 2014-08-28 LAB — HEMOGLOBIN A1C: HEMOGLOBIN A1C: 7.7 % — AB (ref 4.6–6.5)

## 2014-08-28 MED ORDER — TRAMADOL HCL 50 MG PO TABS: 50.0000 mg | ORAL_TABLET | Freq: Three times a day (TID) | ORAL | Status: DC | PRN

## 2014-08-28 MED ORDER — GLUCOSE BLOOD VI STRP: ORAL_STRIP | Status: DC

## 2014-08-28 MED ORDER — METFORMIN HCL 1000 MG PO TABS: 1000.0000 mg | ORAL_TABLET | Freq: Two times a day (BID) | ORAL | Status: DC

## 2014-08-28 MED ORDER — SITAGLIPTIN PHOSPHATE 100 MG PO TABS: ORAL_TABLET | ORAL | Status: DC

## 2014-08-28 NOTE — Assessment & Plan Note (Signed)
BP Readings from Last 3 Encounters:  08/28/14 126/66  07/15/14 129/79  05/28/14 122/62   BP well controlled. Continue current medications.

## 2014-08-28 NOTE — Assessment & Plan Note (Signed)
BG well controlled per report. Will check A1c with labs. Continue current medications. Foot and eye exam UTD.

## 2014-08-28 NOTE — Patient Instructions (Signed)

## 2014-08-28 NOTE — Assessment & Plan Note (Signed)
Symptoms well controlled with prn Tramadol. Will continue. 

## 2014-08-28 NOTE — Progress Notes (Signed)
Subjective:    Patient ID: Anita Mercer, female    DOB: Mar 09, 1946, 68 y.o.   MRN: [id number]  HPI The patient is here for annual Medicare wellness examination and management of other chronic and acute problems.   The risk factors are reflected in the social history.  The roster of all physicians providing medical care to patient - is listed in the Snapshot section of the chart.  Activities of daily living:  The patient is 100% independent in all ADLs: dressing, toileting, feeding as well as independent mobility. Lives with husband in 2 story home. Has an outdoor dog.   Home safety : The patient has smoke detectors in the home. They wear seatbelts.  There are no firearms at home. There is no violence in the home.   There is no risks for hepatitis, STDs or HIV. There is no history of blood transfusion. They have no travel history to infectious disease endemic areas of the world.  The patient has seen their dentist in the last six month. (Dr. Sharlett Iles) They have seen their eye doctor in the last year. (Dr. Thomasene Ripple) No issues with hearing.  They have deferred audiologic testing in the last year.   They do not  have excessive sun exposure. Discussed the need for sun protection: hats, long sleeves and use of sunscreen if there is significant sun exposure. (Dermatologist - none)  Diet: the importance of a healthy diet is discussed. They do have a healthy diet.  The benefits of regular aerobic exercise were discussed. She walks [place]5 [place], yardwork.  Depression screen: there are no signs or vegative symptoms of depression- irritability, change in appetite, anhedonia, sadness/tearfullness.  Cognitive assessment: the patient manages all their financial and personal affairs and is actively engaged. They could relate day,date,year and events.  HCPOA - Carter Kitten, attorney  The following portions of the patient's history were reviewed and updated as appropriate: allergies, current  medications, past family history, past medical history,  past surgical history, past social history  and problem list.  Visual acuity was not assessed per patient preference since she has regular follow up with her ophthalmologist. Hearing and body mass index were assessed and reviewed.   During the course of the visit the patient was educated and counseled about appropriate screening and preventive services including : fall prevention , diabetes screening, nutrition counseling, colorectal cancer screening, and recommended immunizations.    DM - BG running mostly near 80-100. Compliant with medication. Had one BG of 213 after eating at [employer].  Review of Systems  Constitutional: Negative for fever, chills, appetite change, fatigue and unexpected weight change.  Eyes: Negative for visual disturbance.  Respiratory: Negative for shortness of breath.   Cardiovascular: Negative for chest pain and leg swelling.  Gastrointestinal: Negative for nausea, vomiting, abdominal pain, diarrhea and constipation.  Musculoskeletal: Positive for myalgias. Negative for arthralgias and joint swelling.  Skin: Negative for color change and rash.  Hematological: Negative for adenopathy. Does not bruise/bleed easily.  Psychiatric/Behavioral: Negative for sleep disturbance and dysphoric mood. The patient is not nervous/anxious.        Objective:    BP 126/66  Pulse 88  Temp(Src) 98 F (36.7 C) (Oral)  Ht 5\' 2"  (1.575 m)  Wt 149 lb 8 oz (67.813 kg)  BMI 27.34 kg/m2  SpO2 98% Physical Exam  Constitutional: She is oriented to person, place, and time. She appears well-developed and well-nourished. No distress.  HENT:  Head: Normocephalic and atraumatic.  Right  Ear: External ear normal.  Left Ear: External ear normal.  Nose: Nose normal.  Mouth/Throat: Oropharynx is clear and moist. No oropharyngeal exudate.  Eyes: Conjunctivae are normal. Pupils are equal, round, and reactive to light. Right eye  exhibits no discharge. Left eye exhibits no discharge. No scleral icterus.  Neck: Normal range of motion. Neck supple. No tracheal deviation present. No thyromegaly present.  Cardiovascular: Normal rate, regular rhythm, normal heart sounds and intact distal pulses.  Exam reveals no gallop and no friction rub.   No murmur heard. Pulmonary/Chest: Effort normal and breath sounds normal. No accessory muscle usage. Not tachypneic. No respiratory distress. She has no decreased breath sounds. She has no wheezes. She has no rales. She exhibits no tenderness. Right breast exhibits no inverted nipple, no mass, no nipple discharge, no skin change and no tenderness. Left breast exhibits no inverted nipple, no mass, no nipple discharge, no skin change and no tenderness. Breasts are symmetrical.  Abdominal: Soft. Bowel sounds are normal. She exhibits no distension and no mass. There is no tenderness. There is no rebound and no guarding.  Musculoskeletal: Normal range of motion. She exhibits no edema and no tenderness.  Lymphadenopathy:    She has no cervical adenopathy.  Neurological: She is alert and oriented to person, place, and time. No cranial nerve deficit. She exhibits normal muscle tone. Coordination normal.  Skin: Skin is warm and dry. No rash noted. She is not diaphoretic. No erythema. No pallor.  Psychiatric: She has a normal mood and affect. Her behavior is normal. Judgment and thought content normal.          Assessment & Plan:   Problem List Items Addressed This Visit     Unprioritized   Diabetes mellitus type 2, controlled     BG well controlled per report. Will check A1c with labs. Continue current medications. Foot and eye exam UTD.    Relevant Medications      sitaGLIPtin (JANUVIA) tablet      metFORMIN (GLUCOPHAGE) tablet      glucose blood test strip   Other Relevant Orders      Comprehensive metabolic panel      Hemoglobin A1c      Lipid panel      Microalbumin / creatinine  urine ratio   Hyperlipidemia     Will check lipids and LFTs with labs today. Continue Simvastatin.    Hypertension      BP Readings from Last 3 Encounters:  08/28/14 126/66  07/15/14 129/79  05/28/14 122/62   BP well controlled. Continue current medications.    Medicare annual wellness visit, subsequent - Primary     General medical exam including breast exam normal today. Pap and pelvic deferred given patient's age and preference. Health maintenance is up to date. Mammogram UTD and reviewed with pt. Colonoscopy UTD. Flu vaccine and Prevnar vaccine given today. Labs today including CBC, CMP, lipids, A1c. Encourage continued efforts at healthy diet and regular physical activity.      Neuropathic pain     Symptoms well controlled with prn Tramadol. Will continue.        Return in about 3 months (around 11/28/2014) for Recheck of Diabetes.

## 2014-08-28 NOTE — Addendum Note (Signed)
Addended by: Vernetta Honey on: 08/28/2014 08:56 AM   Modules accepted: Orders

## 2014-08-28 NOTE — Progress Notes (Signed)
Pre visit review using our clinic review tool, if applicable. No additional management support is needed unless otherwise documented below in the visit note. 

## 2014-08-28 NOTE — Assessment & Plan Note (Signed)
General medical exam including breast exam normal today. Pap and pelvic deferred given patient's age and preference. Health maintenance is up to date. Mammogram UTD and reviewed with pt. Colonoscopy UTD. Flu vaccine and Prevnar vaccine given today. Labs today including CBC, CMP, lipids, A1c. Encourage continued efforts at healthy diet and regular physical activity.

## 2014-08-28 NOTE — Assessment & Plan Note (Signed)
Will check lipids and LFTs  with labs today. Continue Simvastatin. 

## 2014-11-28 ENCOUNTER — Ambulatory Visit (INDEPENDENT_AMBULATORY_CARE_PROVIDER_SITE_OTHER): Payer: Medicare PPO | Admitting: Internal Medicine

## 2014-11-28 ENCOUNTER — Encounter: Payer: Self-pay | Admitting: Internal Medicine

## 2014-11-28 VITALS — BP 131/78 | HR 87 | Temp 97.4°F | Ht 62.0 in | Wt 148.8 lb

## 2014-11-28 DIAGNOSIS — M792 Neuralgia and neuritis, unspecified: Secondary | ICD-10-CM

## 2014-11-28 DIAGNOSIS — E119 Type 2 diabetes mellitus without complications: Secondary | ICD-10-CM

## 2014-11-28 DIAGNOSIS — E785 Hyperlipidemia, unspecified: Secondary | ICD-10-CM

## 2014-11-28 DIAGNOSIS — I10 Essential (primary) hypertension: Secondary | ICD-10-CM

## 2014-11-28 LAB — COMPREHENSIVE METABOLIC PANEL
ALT: 20 U/L (ref 0–35)
AST: 25 U/L (ref 0–37)
Albumin: 4.3 g/dL (ref 3.5–5.2)
Alkaline Phosphatase: 71 U/L (ref 39–117)
BUN: 16 mg/dL (ref 6–23)
CO2: 29 meq/L (ref 19–32)
Calcium: 9.9 mg/dL (ref 8.4–10.5)
Chloride: 102 mEq/L (ref 96–112)
Creatinine, Ser: 0.6 mg/dL (ref 0.4–1.2)
GFR: 109.37 mL/min (ref >60.00)
GLUCOSE: 120 mg/dL — AB (ref 70–99)
POTASSIUM: 4.8 meq/L (ref 3.5–5.1)
Sodium: 139 mEq/L (ref 135–145)
Total Bilirubin: 0.7 mg/dL (ref 0.2–1.2)
Total Protein: 7.5 g/dL (ref 6.0–8.3)

## 2014-11-28 LAB — LIPID PANEL
CHOLESTEROL: 132 mg/dL (ref 0–200)
HDL: 32.8 mg/dL — ABNORMAL LOW (ref >39.00)
LDL Cholesterol: 69 mg/dL (ref 0–99)
NonHDL: 99.2
Total CHOL/HDL Ratio: 4
Triglycerides: 152 mg/dL — ABNORMAL HIGH (ref 0.0–149.0)
VLDL: 30.4 mg/dL (ref 0.0–40.0)

## 2014-11-28 LAB — MICROALBUMIN / CREATININE URINE RATIO
CREATININE, U: 144.4 mg/dL
MICROALB UR: 1.8 mg/dL (ref 0.0–1.9)
MICROALB/CREAT RATIO: 1.2 mg/g (ref 0.0–30.0)

## 2014-11-28 LAB — HEMOGLOBIN A1C: HEMOGLOBIN A1C: 8.2 % — AB (ref 4.6–6.5)

## 2014-11-28 MED ORDER — LISINOPRIL 10 MG PO TABS: 10.0000 mg | ORAL_TABLET | Freq: Every day | ORAL | Status: DC

## 2014-11-28 MED ORDER — TRAMADOL HCL 50 MG PO TABS: 50.0000 mg | ORAL_TABLET | Freq: Three times a day (TID) | ORAL | Status: DC | PRN

## 2014-11-28 MED ORDER — SIMVASTATIN 20 MG PO TABS: 20.0000 mg | ORAL_TABLET | Freq: Every day | ORAL | Status: DC

## 2014-11-28 NOTE — Assessment & Plan Note (Signed)
Will check lipids with labs today. Continue Simvastatin. 

## 2014-11-28 NOTE — Progress Notes (Signed)
Subjective:    Patient ID: Anita Mercer, female    DOB: November 10, 1946, 69 y.o.   MRN: [id number]  HPI 69YO female presents for follow up.  DM - BG 80-120 fasting. Max 130 post-prandial. Notes some dietary indiscretion over the Holidays. No new concerns today. Feeling well. Persistent neuropathic pain in LE controlled with use of Tramadol prn.   Past medical, surgical, family and social history per today's encounter.  Review of Systems  Constitutional: Negative for fever, chills, appetite change, fatigue and unexpected weight change.  Eyes: Negative for visual disturbance.  Respiratory: Negative for shortness of breath.   Cardiovascular: Negative for chest pain and leg swelling.  Gastrointestinal: Negative for nausea, vomiting, abdominal pain, diarrhea and constipation.  Musculoskeletal: Positive for myalgias. Negative for arthralgias.  Skin: Negative for color change and rash.  Neurological: Positive for numbness. Negative for weakness.  Hematological: Negative for adenopathy. Does not bruise/bleed easily.  Psychiatric/Behavioral: Negative for dysphoric mood. The patient is not nervous/anxious.        Objective:    BP 131/78 mmHg  Pulse 87  Temp(Src) 97.4 F (36.3 C) (Oral)  Ht 5\' 2"  (1.575 m)  Wt 148 lb 12 oz (67.473 kg)  BMI 27.20 kg/m2  SpO2 98% Physical Exam  Constitutional: She is oriented to person, place, and time. She appears well-developed and well-nourished. No distress.  HENT:  Head: Normocephalic and atraumatic.  Right Ear: External ear normal.  Left Ear: External ear normal.  Nose: Nose normal.  Mouth/Throat: Oropharynx is clear and moist. No oropharyngeal exudate.  Eyes: Conjunctivae are normal. Pupils are equal, round, and reactive to light. Right eye exhibits no discharge. Left eye exhibits no discharge. No scleral icterus.  Neck: Normal range of motion. Neck supple. No tracheal deviation present. No thyromegaly present.  Cardiovascular: Normal rate,  regular rhythm, normal heart sounds and intact distal pulses.  Exam reveals no gallop and no friction rub.   No murmur heard. Pulmonary/Chest: Effort normal and breath sounds normal. No accessory muscle usage. No tachypnea. No respiratory distress. She has no decreased breath sounds. She has no wheezes. She has no rhonchi. She has no rales. She exhibits no tenderness.  Musculoskeletal: Normal range of motion. She exhibits no edema or tenderness.  Lymphadenopathy:    She has no cervical adenopathy.  Neurological: She is alert and oriented to person, place, and time. No cranial nerve deficit. She exhibits normal muscle tone. Coordination normal.  Skin: Skin is warm and dry. No rash noted. She is not diaphoretic. No erythema. No pallor.  Psychiatric: She has a normal mood and affect. Her behavior is normal. Judgment and thought content normal.          Assessment & Plan:   Problem List Items Addressed This Visit      Unprioritized   Diabetes mellitus type 2, controlled - Primary    Will check A1c with labs today. Continue current medication.    Relevant Medications      lisinopril (PRINIVIL,ZESTRIL) tablet      simvastatin (ZOCOR) tablet   Other Relevant Orders      Comprehensive metabolic panel      Hemoglobin A1c      Lipid panel      Microalbumin / creatinine urine ratio   Hyperlipidemia    Will check lipids with labs today. Continue Simvastatin.    Relevant Medications      lisinopril (PRINIVIL,ZESTRIL) tablet      simvastatin (ZOCOR) tablet   Hypertension  BP Readings from Last 3 Encounters:  11/28/14 131/78  08/28/14 126/66  07/15/14 129/79   BP well controlled with current medication. Will continue. Renal function with labs.    Relevant Medications      lisinopril (PRINIVIL,ZESTRIL) tablet      simvastatin (ZOCOR) tablet   Neuropathic pain    Persistent numbness and burning in LE bilaterally. Well controlled with prn Tramadol. Will continue.        Return  in about 3 months (around 02/27/2015) for Recheck of Diabetes.

## 2014-11-28 NOTE — Patient Instructions (Signed)
Labs today

## 2014-11-28 NOTE — Assessment & Plan Note (Signed)
Will check A1c with labs today. Continue current medication.

## 2014-11-28 NOTE — Progress Notes (Signed)
Pre visit review using our clinic review tool, if applicable. No additional management support is needed unless otherwise documented below in the visit note. 

## 2014-11-28 NOTE — Assessment & Plan Note (Signed)
Persistent numbness and burning in LE bilaterally. Well controlled with prn Tramadol. Will continue.

## 2014-11-28 NOTE — Assessment & Plan Note (Signed)
BP Readings from Last 3 Encounters:  11/28/14 131/78  08/28/14 126/66  07/15/14 129/79   BP well controlled with current medication. Will continue. Renal function with labs.

## 2015-02-27 ENCOUNTER — Ambulatory Visit (INDEPENDENT_AMBULATORY_CARE_PROVIDER_SITE_OTHER): Payer: Medicare PPO | Admitting: Internal Medicine

## 2015-02-27 ENCOUNTER — Encounter: Payer: Self-pay | Admitting: Internal Medicine

## 2015-02-27 VITALS — BP 121/73 | HR 84 | Temp 97.9°F | Ht 62.0 in | Wt 150.0 lb

## 2015-02-27 DIAGNOSIS — E119 Type 2 diabetes mellitus without complications: Secondary | ICD-10-CM | POA: Diagnosis not present

## 2015-02-27 DIAGNOSIS — Z1211 Encounter for screening for malignant neoplasm of colon: Secondary | ICD-10-CM | POA: Diagnosis not present

## 2015-02-27 DIAGNOSIS — T148 Other injury of unspecified body region: Secondary | ICD-10-CM

## 2015-02-27 DIAGNOSIS — I10 Essential (primary) hypertension: Secondary | ICD-10-CM

## 2015-02-27 DIAGNOSIS — W57XXXA Bitten or stung by nonvenomous insect and other nonvenomous arthropods, initial encounter: Secondary | ICD-10-CM | POA: Diagnosis not present

## 2015-02-27 LAB — COMPREHENSIVE METABOLIC PANEL
ALK PHOS: 75 U/L (ref 39–117)
ALT: 22 U/L (ref 0–35)
AST: 26 U/L (ref 0–37)
Albumin: 4.4 g/dL (ref 3.5–5.2)
BUN: 14 mg/dL (ref 6–23)
CO2: 31 mEq/L (ref 19–32)
CREATININE: 0.77 mg/dL (ref 0.40–1.20)
Calcium: 10.5 mg/dL (ref 8.4–10.5)
Chloride: 98 mEq/L (ref 96–112)
GFR: 78.81 mL/min (ref >60.00)
GLUCOSE: 117 mg/dL — AB (ref 70–99)
Potassium: 4.6 mEq/L (ref 3.5–5.1)
Sodium: 137 mEq/L (ref 135–145)
Total Bilirubin: 0.4 mg/dL (ref 0.2–1.2)
Total Protein: 7.9 g/dL (ref 6.0–8.3)

## 2015-02-27 LAB — CBC WITH DIFFERENTIAL/PLATELET
BASOS PCT: 0.6 % (ref 0.0–3.0)
Basophils Absolute: 0 10*3/uL (ref 0.0–0.1)
EOS PCT: 1.4 % (ref 0.0–5.0)
Eosinophils Absolute: 0.1 10*3/uL (ref 0.0–0.7)
HCT: 42.4 % (ref 36.0–46.0)
Hemoglobin: 14.2 g/dL (ref 12.0–15.0)
LYMPHS PCT: 36.6 % (ref 12.0–46.0)
Lymphs Abs: 2.6 10*3/uL (ref 0.7–4.0)
MCHC: 33.5 g/dL (ref 30.0–36.0)
MCV: 81.8 fl (ref 78.0–100.0)
MONOS PCT: 6.1 % (ref 3.0–12.0)
Monocytes Absolute: 0.4 10*3/uL (ref 0.1–1.0)
Neutro Abs: 4 10*3/uL (ref 1.4–7.7)
Neutrophils Relative %: 55.3 % (ref 43.0–77.0)
PLATELETS: 254 10*3/uL (ref 150.0–400.0)
RBC: 5.19 Mil/uL — AB (ref 3.87–5.11)
RDW: 13.5 % (ref 11.5–15.5)
WBC: 7.2 10*3/uL (ref 4.0–10.5)

## 2015-02-27 LAB — MICROALBUMIN / CREATININE URINE RATIO
Creatinine,U: 100.2 mg/dL
MICROALB/CREAT RATIO: 0.9 mg/g (ref 0.0–30.0)
Microalb, Ur: 0.9 mg/dL (ref 0.0–1.9)

## 2015-02-27 LAB — LIPID PANEL
Cholesterol: 123 mg/dL (ref 0–200)
HDL: 34.9 mg/dL — ABNORMAL LOW (ref >39.00)
NONHDL: 88.1
Total CHOL/HDL Ratio: 4
Triglycerides: 230 mg/dL — ABNORMAL HIGH (ref 0.0–149.0)
VLDL: 46 mg/dL — ABNORMAL HIGH (ref 0.0–40.0)

## 2015-02-27 LAB — HEMOGLOBIN A1C: Hgb A1c MFr Bld: 7.4 % — ABNORMAL HIGH (ref 4.6–6.5)

## 2015-02-27 LAB — LDL CHOLESTEROL, DIRECT: Direct LDL: 57 mg/dL

## 2015-02-27 MED ORDER — TRAMADOL HCL 50 MG PO TABS: 50.0000 mg | ORAL_TABLET | Freq: Three times a day (TID) | ORAL | Status: DC | PRN

## 2015-02-27 NOTE — Assessment & Plan Note (Signed)
Mild erythema at site of tick bite. Discussed risks of RMSF in this area. Will monitor for any symptoms of headache, fever. However, tick bite >7 days ago, so risk low.

## 2015-02-27 NOTE — Progress Notes (Signed)
Subjective:    Patient ID: Anita Mercer, female    DOB: 10/27/1946, 69 y.o.   MRN: [id number]  HPI  69YO female presents for follow up.  Tick bite on 4/1. No side effects noted. No fever, chills, HA.  DM - BG running 70s-low 100s. Max 169. Compliant with medications. No symptomatic lows.  Neuropathy - Having some severe pain at times. Taking Tramadol with some improvement. Pain described as aching or burning in legs, hands, feet.  Concerned about some skin lesions over eyelids. Described as brown and raised. No recent changes noted.   Past medical, surgical, family and social history per today's encounter.  Review of Systems  Constitutional: Negative for fever, chills, appetite change, fatigue and unexpected weight change.  Eyes: Negative for visual disturbance.  Respiratory: Negative for shortness of breath.   Cardiovascular: Negative for chest pain and leg swelling.  Gastrointestinal: Negative for nausea, vomiting, abdominal pain, diarrhea and constipation.  Musculoskeletal: Positive for myalgias and arthralgias.  Skin: Positive for color change. Negative for rash.  Neurological: Negative for weakness and numbness.  Hematological: Negative for adenopathy. Does not bruise/bleed easily.  Psychiatric/Behavioral: Negative for dysphoric mood. The patient is not nervous/anxious.        Objective:    BP 121/73 mmHg  Pulse 84  Temp(Src) 97.9 F (36.6 C) (Oral)  Ht 5\' 2"  (1.575 m)  Wt 150 lb (68.04 kg)  BMI 27.43 kg/m2  SpO2 99% Physical Exam  Constitutional: She is oriented to person, place, and time. She appears well-developed and well-nourished. No distress.  HENT:  Head: Normocephalic and atraumatic.  Right Ear: External ear normal.  Left Ear: External ear normal.  Nose: Nose normal.  Mouth/Throat: Oropharynx is clear and moist. No oropharyngeal exudate.  Eyes: Conjunctivae are normal. Pupils are equal, round, and reactive to light. Right eye exhibits no discharge.  Left eye exhibits no discharge. No scleral icterus.  Neck: Normal range of motion. Neck supple. No tracheal deviation present. No thyromegaly present.  Cardiovascular: Normal rate, regular rhythm, normal heart sounds and intact distal pulses.  Exam reveals no gallop and no friction rub.   No murmur heard. Pulmonary/Chest: Effort normal and breath sounds normal. No respiratory distress. She has no wheezes. She has no rales. She exhibits no tenderness.  Musculoskeletal: Normal range of motion. She exhibits no edema or tenderness.  Lymphadenopathy:    She has no cervical adenopathy.  Neurological: She is alert and oriented to person, place, and time. No cranial nerve deficit. She exhibits normal muscle tone. Coordination normal.  Skin: Skin is warm and dry. No rash noted. She is not diaphoretic. No erythema. No pallor.     Few brown papules over eyelids and forehead, most c/w SK  Psychiatric: She has a normal mood and affect. Her behavior is normal. Judgment and thought content normal.          Assessment & Plan:   Problem List Items Addressed This Visit      Unprioritized   Diabetes mellitus type 2, controlled - Primary    Will check A1c with labs. BG well controlled by report. Continue current medications.      Relevant Orders   Comprehensive metabolic panel   Hemoglobin A1c   Lipid panel   Microalbumin / creatinine urine ratio   Hypertension    BP Readings from Last 3 Encounters:  02/27/15 121/73  11/28/14 131/78  08/28/14 126/66   BP well controlled. Continue current medication. Renal function with labs.  Screening for colon cancer    Referral placed for colonoscopy.      Relevant Orders   Ambulatory referral to Gastroenterology   Tick bite    Mild erythema at site of tick bite. Discussed risks of RMSF in this area. Will monitor for any symptoms of headache, fever. However, tick bite >7 days ago, so risk low.      Relevant Orders   CBC with  Differential/Platelet       Return in about 3 months (around 05/29/2015) for Recheck of Diabetes.

## 2015-02-27 NOTE — Assessment & Plan Note (Signed)
Referral placed for colonoscopy. 

## 2015-02-27 NOTE — Assessment & Plan Note (Signed)
Will check A1c with labs. BG well controlled by report. Continue current medications.

## 2015-02-27 NOTE — Assessment & Plan Note (Signed)
BP Readings from Last 3 Encounters:  02/27/15 121/73  11/28/14 131/78  08/28/14 126/66   BP well controlled. Continue current medication. Renal function with labs.

## 2015-02-27 NOTE — Patient Instructions (Signed)
Labs today.   Follow up in 3 months.  

## 2015-02-27 NOTE — Progress Notes (Signed)
Pre visit review using our clinic review tool, if applicable. No additional management support is needed unless otherwise documented below in the visit note. 

## 2015-03-14 NOTE — Op Note (Signed)
PATIENT NAME:  Anita Mercer, Anita Mercer MR#:  [id number] DATE OF BIRTH:  1946-06-28  DATE OF PROCEDURE:  04/04/2013  PREOPERATIVE DIAGNOSIS: Cataract  right eye.   POSTOPERATIVE DIAGNOSIS:  Cataract right eye.    PROCEDURE PERFORMED: Extracapsular cataract extraction using phacoemulsification with placement of Alcon [id number], 25.0 diopter posterior chamber lens with 1.5 diopters of cylinder, serial number [id number].   SURGEON: Loura Back. Linnell Swords, M.D.   ANESTHESIA: Lidocaine 4% and 0.75% Marcaine in a 50-50 mixture with 10 units/mL of Hylenex added, given as a peribulbar.   ANESTHESIOLOGIST: Dr. Benjamine Mola.   COMPLICATIONS: None.   ESTIMATED BLOOD LOSS: Less than 1 mL.   DESCRIPTION OF PROCEDURE: The patient was brought to the Operating Room and  both eyes were anesthetized with topical proparacaine. Tetracaine was used to further anesthetize the right eye. With the patient sitting upright and fixing at a distant target, the 3 and 9 o'clock  positions were marked using a marking pen. The accuracy of the mark was confirmed using an ASICO toric marker. The patient was placed supine, given IV sedation and peribulbar block. She was then prepped and draped in the usual fashion.   The vertical rectus muscles were imbricated using 5-0 silk sutures, bridle sutures. The toric marker was used to mark the 90-degree position for the position of the incision. The lens was to go in at 0 degrees so there was no reason to mark the 0-degree position since that had already been marked. A limbal peritomy was carried out at 90 degrees for 1 o'clock hour and hemostasis obtained with cautery. A partial-thickness scleral groove was made at the posterior surgical limbus, dissected anteriorly into clear cornea with an Alcon crescent knife. The anterior chamber was entered superonasally through clear cornea with a paracentesis knife and through the lamellar dissection with a 2.6 mm keratome. DisCoVisc was used to replace the aqueous and  a continuous-tear circular capsulorrhexis was carried out. Hydrodelineation was used to loosen the nucleus and phacoemulsification was carried out in a divide-and-conquer technique. Ultrasound time was 1 minute and 31 seconds, average power of 24.2%,  CDE of 39.69. Irrigation-aspiration was used to remove the residual cortex. The capsular bag was inflated with DisCoVisc and the intraocular lens was inserted using a Librarian, academic. The lens was rotated so the marks on the haptics lined up with 0-degree marks at the limbus. Irrigation-aspiration was used to remove the residual DisCoVisc.   The wound was inflated with balanced salt and Miostat was injected in the paracentesis tract. Cefuroxime 0.1 mL was injected through the paracentesis track as well. The wound was checked for leaks. None were found. The conjunctiva was closed with cautery. The bridle sutures were removed. Three drops of Vigamox were placed on the eye. A shield was placed on the eye. The patient was discharged to the recovery room in good condition.    ____________________________ Loura Back Nabiha Planck, MD sad:cs D: 04/04/2013 11:58:20 ET T: 04/04/2013 14:14:42 ET JOB#: [id number]  cc: Remo Lipps A. Hoby Kawai, MD, <Dictator> Martie Lee MD ELECTRONICALLY SIGNED 04/09/2013 14:14

## 2015-03-18 ENCOUNTER — Other Ambulatory Visit: Payer: Self-pay | Admitting: Internal Medicine

## 2015-03-18 DIAGNOSIS — E119 Type 2 diabetes mellitus without complications: Secondary | ICD-10-CM

## 2015-03-18 MED ORDER — INSULIN PEN NEEDLE 31G X 5 MM MISC: Status: DC

## 2015-04-17 LAB — HM DIABETES EYE EXAM

## 2015-04-18 ENCOUNTER — Encounter: Payer: Self-pay | Admitting: *Deleted

## 2015-06-04 ENCOUNTER — Encounter: Payer: Self-pay | Admitting: Internal Medicine

## 2015-06-04 ENCOUNTER — Ambulatory Visit (INDEPENDENT_AMBULATORY_CARE_PROVIDER_SITE_OTHER): Payer: Medicare PPO | Admitting: Internal Medicine

## 2015-06-04 VITALS — BP 130/75 | HR 80 | Temp 97.8°F | Ht 62.0 in | Wt 150.2 lb

## 2015-06-04 DIAGNOSIS — K219 Gastro-esophageal reflux disease without esophagitis: Secondary | ICD-10-CM

## 2015-06-04 DIAGNOSIS — E785 Hyperlipidemia, unspecified: Secondary | ICD-10-CM

## 2015-06-04 DIAGNOSIS — E119 Type 2 diabetes mellitus without complications: Secondary | ICD-10-CM | POA: Diagnosis not present

## 2015-06-04 DIAGNOSIS — I10 Essential (primary) hypertension: Secondary | ICD-10-CM

## 2015-06-04 DIAGNOSIS — Z1211 Encounter for screening for malignant neoplasm of colon: Secondary | ICD-10-CM

## 2015-06-04 DIAGNOSIS — M792 Neuralgia and neuritis, unspecified: Secondary | ICD-10-CM

## 2015-06-04 LAB — HEMOGLOBIN A1C: HEMOGLOBIN A1C: 7.7 % — AB (ref 4.6–6.5)

## 2015-06-04 LAB — COMPREHENSIVE METABOLIC PANEL
ALT: 27 U/L (ref 0–35)
AST: 23 U/L (ref 0–37)
Albumin: 4.2 g/dL (ref 3.5–5.2)
Alkaline Phosphatase: 67 U/L (ref 39–117)
BILIRUBIN TOTAL: 0.5 mg/dL (ref 0.2–1.2)
BUN: 16 mg/dL (ref 6–23)
CO2: 29 meq/L (ref 19–32)
CREATININE: 0.69 mg/dL (ref 0.40–1.20)
Calcium: 10.2 mg/dL (ref 8.4–10.5)
Chloride: 101 mEq/L (ref 96–112)
GFR: 89.38 mL/min (ref >60.00)
GLUCOSE: 112 mg/dL — AB (ref 70–99)
Potassium: 4.2 mEq/L (ref 3.5–5.1)
Sodium: 140 mEq/L (ref 135–145)
Total Protein: 7.5 g/dL (ref 6.0–8.3)

## 2015-06-04 LAB — LDL CHOLESTEROL, DIRECT: Direct LDL: 60 mg/dL

## 2015-06-04 LAB — LIPID PANEL
Cholesterol: 125 mg/dL (ref 0–200)
HDL: 33.7 mg/dL — AB (ref >39.00)
NonHDL: 91.3
Total CHOL/HDL Ratio: 4
Triglycerides: 246 mg/dL — ABNORMAL HIGH (ref 0.0–149.0)
VLDL: 49.2 mg/dL — ABNORMAL HIGH (ref 0.0–40.0)

## 2015-06-04 MED ORDER — INSULIN LISPRO 100 UNIT/ML ~~LOC~~ SOLN: SUBCUTANEOUS | Status: DC

## 2015-06-04 MED ORDER — TRAMADOL HCL 50 MG PO TABS: 50.0000 mg | ORAL_TABLET | Freq: Three times a day (TID) | ORAL | Status: DC | PRN

## 2015-06-04 MED ORDER — INSULIN GLARGINE 100 UNIT/ML ~~LOC~~ SOLN: 45.0000 [IU] | Freq: Every day | SUBCUTANEOUS | Status: DC

## 2015-06-04 NOTE — Assessment & Plan Note (Signed)
Lipids and LFTs with labs today.

## 2015-06-04 NOTE — Assessment & Plan Note (Signed)
Having some GERD symptoms. Using occasional Tums with improvement. Encouraged her to discuss with GI physician to consider possible upper endoscopy during her anesthesia for colonoscopy.

## 2015-06-04 NOTE — Assessment & Plan Note (Signed)
Symptoms well controlled on Tramadol. Encouraged follow up with podiatry to see if additional orthotic might be helpful for left heel pain.

## 2015-06-04 NOTE — Assessment & Plan Note (Signed)
Recent increase in BG. Suspect related to increased intake of carbohydrates. Will check A1c. We discussed potential increase in Lantus to 50units daily, but will wait until results back.

## 2015-06-04 NOTE — Patient Instructions (Addendum)
Try to limit carbohydrate intake to 35gm per meal and 15gm with a snack.  Labs today.

## 2015-06-04 NOTE — Assessment & Plan Note (Signed)
Discussion with pt and her husband today about the potential risks and benefits of colonoscopy and anesthesia. Offered reassurance that her risks should be low and benefits outway risks. She will discuss further with GI at her appointment in July.

## 2015-06-04 NOTE — Progress Notes (Signed)
Pre visit review using our clinic review tool, if applicable. No additional management support is needed unless otherwise documented below in the visit note. 

## 2015-06-04 NOTE — Assessment & Plan Note (Signed)
BP Readings from Last 3 Encounters:  06/04/15 130/75  02/27/15 121/73  11/28/14 131/78   BP well controlled. Renal function with labs. Continue current medication.

## 2015-06-04 NOTE — Progress Notes (Signed)
Subjective:    Patient ID: Anita Mercer, female    DOB: [birth year], 69 y.o.   MRN: [id number]  HPI  69YO female presents for follow up.  DM - BG more elevated over last few weeks. No change in diet. Compliant with medications. Added Humalog for some elevated BG over 200. This morning's BG was 79. Trying to limit intake of processed carbohydrates.  Left heel/sole pain- Burning. Started in March. Improved with rest. Using Tramadol with some improvement.  Concerned about her risk of having repeat colonoscopy. Had issues with [facility] during last colonoscopy, and reports there were confusions about her medications.  Having some GERD symptoms with reflux and belching esp after heavy meals. Takes Tums with resolution.   Past medical, surgical, family and social history per today's encounter.  Review of Systems  Constitutional: Negative for fever, chills, appetite change, fatigue and unexpected weight change.  Eyes: Negative for visual disturbance.  Respiratory: Negative for shortness of breath.   Cardiovascular: Negative for chest pain and leg swelling.  Gastrointestinal: Positive for abdominal pain (intermittent reflux symptoms). Negative for nausea, vomiting, diarrhea, constipation and abdominal distention.  Musculoskeletal: Positive for myalgias and arthralgias.  Skin: Negative for color change and rash.  Hematological: Negative for adenopathy. Does not bruise/bleed easily.  Psychiatric/Behavioral: Negative for sleep disturbance and dysphoric mood. The patient is nervous/anxious.        Objective:    BP 130/75 mmHg  Pulse 80  Temp(Src) 97.8 F (36.6 C) (Oral)  Ht 5\' 2"  (1.575 m)  Wt 150 lb 4 oz (68.153 kg)  BMI 27.47 kg/m2  SpO2 99% Physical Exam  Constitutional: She is oriented to person, place, and time. She appears well-developed and well-nourished. No distress.  HENT:  Head: Normocephalic and atraumatic.  Right Ear: External ear normal.  Left Ear: External ear normal.    Nose: Nose normal.  Mouth/Throat: Oropharynx is clear and moist. No oropharyngeal exudate.  Eyes: Conjunctivae are normal. Pupils are equal, round, and reactive to light. Right eye exhibits no discharge. Left eye exhibits no discharge. No scleral icterus.  Neck: Normal range of motion. Neck supple. No tracheal deviation present. No thyromegaly present.  Cardiovascular: Normal rate, regular rhythm, normal heart sounds and intact distal pulses.  Exam reveals no gallop and no friction rub.   No murmur heard. Pulmonary/Chest: Effort normal and breath sounds normal. No respiratory distress. She has no wheezes. She has no rales. She exhibits no tenderness.  Musculoskeletal: Normal range of motion. She exhibits no edema or tenderness.  Lymphadenopathy:    She has no cervical adenopathy.  Neurological: She is alert and oriented to person, place, and time. No cranial nerve deficit. She exhibits normal muscle tone. Coordination normal.  Skin: Skin is warm and dry. No rash noted. She is not diaphoretic. No erythema. No pallor.  Psychiatric: She has a normal mood and affect. Her behavior is normal. Judgment and thought content normal.          Assessment & Plan:  Over 66min of which >50% spent in face-to-face contact with patient discussing plan of care  Problem List Items Addressed This Visit      Unprioritized   Diabetes mellitus type 2, controlled - Primary    Recent increase in BG. Suspect related to increased intake of carbohydrates. Will check A1c. We discussed potential increase in Lantus to 50units daily, but will wait until results back.      Relevant Medications   insulin lispro (HUMALOG) 100 UNIT/ML injection  insulin glargine (LANTUS) 100 UNIT/ML injection   Other Relevant Orders   Comprehensive metabolic panel   Hemoglobin A1c   Lipid panel   GERD (gastroesophageal reflux disease)    Having some GERD symptoms. Using occasional Tums with improvement. Encouraged her to discuss  with GI physician to consider possible upper endoscopy during her anesthesia for colonoscopy.      Hyperlipidemia    Lipids and LFTs with labs today.      Hypertension    BP Readings from Last 3 Encounters:  06/04/15 130/75  02/27/15 121/73  11/28/14 131/78   BP well controlled. Renal function with labs. Continue current medication.      Neuropathic pain    Symptoms well controlled on Tramadol. Encouraged follow up with podiatry to see if additional orthotic might be helpful for left heel pain.      Screening for colon cancer    Discussion with pt and her husband today about the potential risks and benefits of colonoscopy and anesthesia. Offered reassurance that her risks should be low and benefits outway risks. She will discuss further with GI at her appointment in July.          Return in about 3 months (around 09/04/2015) for Recheck of Diabetes.

## 2015-06-06 ENCOUNTER — Telehealth: Payer: Self-pay | Admitting: Internal Medicine

## 2015-06-06 ENCOUNTER — Other Ambulatory Visit: Payer: Self-pay | Admitting: Internal Medicine

## 2015-06-06 NOTE — Telephone Encounter (Signed)
Pt came in with bruising on arm following lab work.

## 2015-06-06 NOTE — Telephone Encounter (Signed)
Spoke with Patient and her husband about her incident in the lab.  She stressed that she wants me to educate staff and make them aware of her issue so it doesn't happen again.  Post lab draw two days ago, patient went to remove the dressing and she had a light bluish colored bruise at puncture site.  Patient was unable to come to office sooner to show me.  Site is not painful and is now purplish/blue in color.  States that when labs were drawn she felt like it was different then last time.  Had this happen a few years ago at [facility].  Will follow up with staff involved.

## 2015-06-06 NOTE — Telephone Encounter (Signed)
This concern was forwarded to [facility] for direction.  Anita Mercer spoke with pt.

## 2015-06-07 ENCOUNTER — Other Ambulatory Visit: Payer: Self-pay | Admitting: Internal Medicine

## 2015-06-24 ENCOUNTER — Telehealth: Payer: Self-pay | Admitting: Internal Medicine

## 2015-06-24 NOTE — Telephone Encounter (Signed)
Pt dropped off blood sugar levels.. Placed in Dr. Derry Skill box. please advise pt.

## 2015-07-11 ENCOUNTER — Encounter: Payer: Self-pay | Admitting: Internal Medicine

## 2015-08-08 ENCOUNTER — Encounter: Payer: Self-pay | Admitting: *Deleted

## 2015-08-08 HISTORY — PX: UPPER GASTROINTESTINAL ENDOSCOPY: SHX188

## 2015-08-11 ENCOUNTER — Ambulatory Visit: Payer: Medicare PPO | Admitting: Anesthesiology

## 2015-08-11 ENCOUNTER — Ambulatory Visit
Admission: RE | Admit: 2015-08-11 | Discharge: 2015-08-11 | Disposition: A | Discharge status: Home / Self Care | Payer: Medicare PPO | Source: Ambulatory Visit | Attending: Gastroenterology | Admitting: Gastroenterology

## 2015-08-11 ENCOUNTER — Encounter
Admission: RE | Disposition: A | Discharge status: Home / Self Care | Payer: Self-pay | Source: Ambulatory Visit | Attending: Gastroenterology

## 2015-08-11 ENCOUNTER — Encounter: Payer: Self-pay | Admitting: *Deleted

## 2015-08-11 DIAGNOSIS — Z86010 Personal history of colonic polyps: Secondary | ICD-10-CM | POA: Diagnosis not present

## 2015-08-11 DIAGNOSIS — Z794 Long term (current) use of insulin: Secondary | ICD-10-CM | POA: Diagnosis not present

## 2015-08-11 DIAGNOSIS — K269 Duodenal ulcer, unspecified as acute or chronic, without hemorrhage or perforation: Secondary | ICD-10-CM | POA: Insufficient documentation

## 2015-08-11 DIAGNOSIS — K219 Gastro-esophageal reflux disease without esophagitis: Secondary | ICD-10-CM | POA: Diagnosis present

## 2015-08-11 DIAGNOSIS — K210 Gastro-esophageal reflux disease with esophagitis: Secondary | ICD-10-CM | POA: Insufficient documentation

## 2015-08-11 DIAGNOSIS — Z79899 Other long term (current) drug therapy: Secondary | ICD-10-CM | POA: Insufficient documentation

## 2015-08-11 DIAGNOSIS — I10 Essential (primary) hypertension: Secondary | ICD-10-CM | POA: Insufficient documentation

## 2015-08-11 DIAGNOSIS — E119 Type 2 diabetes mellitus without complications: Secondary | ICD-10-CM | POA: Diagnosis not present

## 2015-08-11 DIAGNOSIS — Z7982 Long term (current) use of aspirin: Secondary | ICD-10-CM | POA: Diagnosis not present

## 2015-08-11 DIAGNOSIS — E785 Hyperlipidemia, unspecified: Secondary | ICD-10-CM | POA: Diagnosis not present

## 2015-08-11 DIAGNOSIS — K319 Disease of stomach and duodenum, unspecified: Secondary | ICD-10-CM | POA: Diagnosis not present

## 2015-08-11 DIAGNOSIS — K2970 Gastritis, unspecified, without bleeding: Secondary | ICD-10-CM | POA: Diagnosis not present

## 2015-08-11 DIAGNOSIS — G629 Polyneuropathy, unspecified: Secondary | ICD-10-CM | POA: Insufficient documentation

## 2015-08-11 DIAGNOSIS — K5730 Diverticulosis of large intestine without perforation or abscess without bleeding: Secondary | ICD-10-CM | POA: Diagnosis not present

## 2015-08-11 HISTORY — DX: Essential (primary) hypertension: I10

## 2015-08-11 HISTORY — DX: Hyperlipidemia, unspecified: E78.5

## 2015-08-11 HISTORY — PX: ESOPHAGOGASTRODUODENOSCOPY (EGD) WITH PROPOFOL: SHX5813

## 2015-08-11 HISTORY — DX: Myoneural disorder, unspecified: G70.9

## 2015-08-11 HISTORY — PX: COLONOSCOPY WITH PROPOFOL: SHX5780

## 2015-08-11 LAB — GLUCOSE, CAPILLARY: Glucose-Capillary: 151 mg/dL — ABNORMAL HIGH (ref 65–99)

## 2015-08-11 LAB — HM COLONOSCOPY

## 2015-08-11 SURGERY — COLONOSCOPY WITH PROPOFOL
Anesthesia: General

## 2015-08-11 MED ORDER — SODIUM CHLORIDE 0.9 % IV SOLN
INTRAVENOUS | Status: DC
  Administered 2015-08-11: 1000 mL via INTRAVENOUS

## 2015-08-11 MED ORDER — PROPOFOL 10 MG/ML IV BOLUS
INTRAVENOUS | Status: DC | PRN
  Administered 2015-08-11: 30 mg via INTRAVENOUS

## 2015-08-11 MED ORDER — SODIUM CHLORIDE 0.9 % IV SOLN: INTRAVENOUS | Status: DC

## 2015-08-11 MED ORDER — PROPOFOL INFUSION 10 MG/ML OPTIME
INTRAVENOUS | Status: DC | PRN
  Administered 2015-08-11: 120 ug/kg/min via INTRAVENOUS

## 2015-08-11 MED ORDER — MIDAZOLAM HCL 2 MG/2ML IJ SOLN
INTRAMUSCULAR | Status: DC | PRN
  Administered 2015-08-11: 1 mg via INTRAVENOUS

## 2015-08-11 MED ORDER — FENTANYL CITRATE (PF) 100 MCG/2ML IJ SOLN
INTRAMUSCULAR | Status: DC | PRN
  Administered 2015-08-11: 1 ug via INTRAVENOUS

## 2015-08-11 NOTE — H&P (Signed)
Outpatient short stay form Pre-procedure 08/11/2015 8:21 AM Anita Sails MD  Primary Physician: Dr. Ronette Deter  Reason for visit:  EGD and colonoscopy  History of present illness:  Patient is a 69 year old female presenting with reflux-type symptoms dyspepsia and personal history of adenomatous colon polyps. She had a trial of omeprazole which a for some atypical reaction and she is not currently on that medication.  Patient is a long-term diabetic.    Current facility-administered medications:  .  0.9 %  sodium chloride infusion, , Intravenous, Continuous, Anita Sails, MD, Last Rate: 20 mL/hr at 08/11/15 0752, 1,000 mL at 08/11/15 0752 .  0.9 %  sodium chloride infusion, , Intravenous, Continuous, Anita Sails, MD  Prescriptions prior to admission  Medication Sig Dispense Refill Last Dose  . lisinopril (PRINIVIL,ZESTRIL) 10 MG tablet Take 1 tablet (10 mg total) by mouth daily. 90 tablet 3 08/11/2015 at 0545  . loratadine (CLARITIN) 10 MG tablet Take 10 mg by mouth daily.     Marland Kitchen aspirin EC 81 MG tablet Take 81 mg by mouth daily.     Taking  . Blood Glucose Calibration (OT ULTRA/FASTTK CNTRL SOLN) SOLN 1 drop by In Vitro route once. Please dispense controls for patient to use at home with One Touch Ultra machine. Dx code 250.0 1 each 0 Taking  . CRANBERRY PO Take by mouth.   Taking  . fluticasone (FLONASE) 50 MCG/ACT nasal spray Place 2 sprays into both nostrils daily. 16 g 6 Taking  . glucose blood test strip Please dispense One Touch Ultra testing strips for patient to use with home device. Checking blood sugars 2-3 times a day. Dx code 250.0 100 each 12 Taking  . insulin glargine (LANTUS) 100 UNIT/ML injection Inject 0.45 mLs (45 Units total) into the skin daily. 10 mL 11   . insulin lispro (HUMALOG) 100 UNIT/ML injection Use only w/blood sugar greater than >250 10 mL 5   . Insulin Pen Needle (B-D UF III MINI PEN NEEDLES) 31G X 5 MM MISC Patient is checking blood  sugars at home 2-3 times a day.  Dx code 250.0 100 each 3 Taking  . Lancets (ONETOUCH ULTRASOFT) lancets USE TO CHECK BLOOD SUGAR 2-3 TIMES PER DAY 100 each 1   . LANTUS SOLOSTAR 100 UNIT/ML Solostar Pen INJECT 45 UNITS INTO THE SKIN DAILY 15 pen 11   . metFORMIN (GLUCOPHAGE) 1000 MG tablet Take 1 tablet (1,000 mg total) by mouth 2 (two) times daily. 180 tablet 3 Taking  . OMEGA-3 KRILL OIL 300 MG CAPS Take by mouth daily.     Taking  . simvastatin (ZOCOR) 20 MG tablet Take 1 tablet (20 mg total) by mouth at bedtime. 90 tablet 3 Taking  . sitaGLIPtin (JANUVIA) 100 MG tablet TAKE ONE TABLET BY MOUTH ONE TIME DAILY 90 tablet 3 Taking  . traMADol (ULTRAM) 50 MG tablet Take 1-2 tablets (50-100 mg total) by mouth 3 (three) times daily as needed. 180 tablet 2      Allergies  Allergen Reactions  . Ciprofloxacin     Worsening symptoms of neuropathy  . Omeprazole Itching, Swelling and Other (See Comments)     Past Medical History  Diagnosis Date  . [facility] spotted fever)   . Diabetes mellitus 2000  . Hypertension   . Hyperlipidemia   . Neuromuscular disorder     neuropathy    Review of systems:      Physical Exam    Heart and lungs:  Regular rate and rhythm without rub or gallop lungs bilaterally clear    HEENT: Norm cephalic atraumatic eyes are anicteric    Other:     Pertinant exam for procedure: Soft nontender nondistended bowel sounds positive normoactive    Planned proceedures: EGD and colonoscopy with indicated procedures I have discussed the risks benefits and complications of procedures to include not limited to bleeding, infection, perforation and the risk of sedation and the patient wishes to proceed.    Anita Sails, MD Gastroenterology 08/11/2015  8:21 AM

## 2015-08-11 NOTE — Anesthesia Postprocedure Evaluation (Signed)
  Anesthesia Post-op Note  Patient: Anita Mercer  Procedure(s) Performed: Procedure(s): COLONOSCOPY WITH PROPOFOL (N/A) ESOPHAGOGASTRODUODENOSCOPY (EGD) WITH PROPOFOL (N/A)  Anesthesia type:General  Patient location: PACU  Post pain: Pain level controlled  Post assessment: Post-op Vital signs reviewed, Patient's Cardiovascular Status Stable, Respiratory Function Stable, Patent Airway and No signs of Nausea or vomiting  Post vital signs: Reviewed and stable  Last Vitals:  Filed Vitals:   08/11/15 0735  BP: 145/69  Pulse: 81  Temp: 36.2 C  Resp: 14    Level of consciousness: awake, alert  and patient cooperative  Complications: No apparent anesthesia complications

## 2015-08-11 NOTE — Transfer of Care (Signed)
Immediate Anesthesia Transfer of Care Note  Patient: Anita Mercer  Procedure(s) Performed: Procedure(s): COLONOSCOPY WITH PROPOFOL (N/A) ESOPHAGOGASTRODUODENOSCOPY (EGD) WITH PROPOFOL (N/A)  Patient Location: PACU  Anesthesia Type:General  Level of Consciousness: sedated  Airway & Oxygen Therapy: Patient connected to nasal cannula oxygen  Post-op Assessment: Report given to RN  Post vital signs: Reviewed  Last Vitals:  Filed Vitals:   08/11/15 0735  BP: 145/69  Pulse: 81  Temp: 36.2 C  Resp: 14    Complications: No apparent anesthesia complications

## 2015-08-11 NOTE — Op Note (Signed)
[facility] Gastroenterology Patient Name: Anita Mercer Procedure Date: 08/11/2015 8:23 AM MRN: [id number] Account #: [id number] Date of Birth: 02/09/1946 Admit Type: Outpatient Age: 69 Room: [facility] ENDO ROOM 2 Gender: Female Note Status: Finalized Procedure:         Upper GI endoscopy Indications:       Dyspepsia, Gastro-esophageal reflux disease Providers:         Lollie Sails, MD Referring MD:      Eduard Clos. Gilford Rile, MD (Referring MD) Medicines:         Monitored Anesthesia Care Complications:     No immediate complications. Procedure:         Pre-Anesthesia Assessment:                    - ASA Grade Assessment: III - A patient with severe                     systemic disease.                    After obtaining informed consent, the endoscope was passed                     under direct vision. Throughout the procedure, the                     patient's blood pressure, pulse, and oxygen saturations                     were monitored continuously. The Endoscope was introduced                     through the mouth, and advanced to the third part of                     duodenum. The upper GI endoscopy was accomplished without                     difficulty. The patient tolerated the procedure well. Findings:      LA Grade A (one or more mucosal breaks less than 5 mm, not extending       between tops of 2 mucosal folds) esophagitis with no bleeding was found.       Biopsies were taken with a cold forceps for histology.      Patchy mildly erythematous mucosa without bleeding was found in the       gastric body and in the gastric antrum. Biopsies were taken with a cold       forceps for histology. Biopsies were taken with a cold forceps for       Helicobacter pylori testing.      Patchy moderate inflammation characterized by erosions, erythema and       shallow ulcerations was found in the duodenal bulb and in the second       part of the duodenum.      The  cardia and gastric fundus were normal on retroflexion.      Abnormal motility was noted in the esophagus. The cricopharyngeus was       normal. There are extra peristaltic waves of the esophageal body.       Secondary peristaltic waves are noted. Impression:        - LA Grade A esophagitis. Biopsied.                    -  Erythematous mucosa in the gastric body and antrum.                     Biopsied.                    - Chronic duodenitis. Recommendation:    - Await pathology results.                    - Use Zantac (ranitidine) 150 mg PO BID daily.                    - Await pathology results. Procedure Code(s): --- Professional ---                    [phone], Esophagogastroduodenoscopy, flexible, transoral;                     with biopsy, single or multiple Diagnosis Code(s): --- Professional ---                    530.10, Esophagitis, unspecified                    537.89, Other specified disorders of stomach and duodenum                    535.60, Duodenitis, without mention of hemorrhage                    536.8, Dyspepsia and other specified disorders of function                     of stomach                    530.81, Esophageal reflux CPT copyright 2014 American Medical Association. All rights reserved. The codes documented in this report are preliminary and upon coder review may  be revised to meet current compliance requirements. Lollie Sails, MD 08/11/2015 8:43:40 AM This report has been signed electronically. Number of Addenda: 0 Note Initiated On: 08/11/2015 8:23 AM      [facility]

## 2015-08-11 NOTE — Op Note (Addendum)
[facility] Gastroenterology Patient Name: Anita Mercer Procedure Date: 08/11/2015 8:23 AM MRN: [id number] Account #: [id number] Date of Birth: 1946-05-21 Admit Type: Outpatient Age: 69 Room: [facility] ENDO ROOM 2 Gender: Female Note Status: Finalized Procedure:         Colonoscopy Indications:       Personal history of colonic polyps Providers:         Lollie Sails, MD Referring MD:      Eduard Clos. Gilford Rile, MD (Referring MD) Medicines:         Monitored Anesthesia Care Complications:     No immediate complications. Procedure:         Pre-Anesthesia Assessment:                    - ASA Grade Assessment: III - A patient with severe                     systemic disease.                    After obtaining informed consent, the colonoscope was                     passed under direct vision. Throughout the procedure, the                     patient's blood pressure, pulse, and oxygen saturations                     were monitored continuously. The Colonoscope was                     introduced through the anus and advanced to the the cecum,                     identified by appendiceal orifice and ileocecal valve. The                     colonoscopy was unusually difficult due to a tortuous                     colon. Successful completion of the procedure was aided by                     changing the patient to a supine position. The patient                     tolerated the procedure well. The quality of the bowel                     preparation was good. Findings:      A few small-mouthed diverticula were found in the sigmoid colon.      The digital rectal exam was normal.      The retroflexed view of the distal rectum and anal verge was normal and       showed no anal or rectal abnormalities. Impression:        - Diverticulosis in the sigmoid colon.                    - The distal rectum and anal verge are normal on                     retroflexion view.                     -  No specimens collected. Recommendation:    - Repeat colonoscopy in 5 years for surveillance. Procedure Code(s): --- Professional ---                    [id number], Colonoscopy, flexible; diagnostic, including                     collection of specimen(s) by brushing or washing, when                     performed (separate procedure) Diagnosis Code(s): --- Professional ---                    V12.72, Personal history of colonic polyps                    562.10, Diverticulosis of colon (without mention of                     hemorrhage) CPT copyright 2014 American Medical Association. All rights reserved. The codes documented in this report are preliminary and upon coder review may  be revised to meet current compliance requirements. Lollie Sails, MD 08/11/2015 9:07:57 AM This report has been signed electronically. Number of Addenda: 0 Note Initiated On: 08/11/2015 8:23 AM Scope Withdrawal Time: 0 hours 8 minutes 6 seconds  Total Procedure Duration: 0 hours 19 minutes 11 seconds       [facility]

## 2015-08-11 NOTE — Anesthesia Postprocedure Evaluation (Signed)
  Anesthesia Post-op Note  Patient: Anita Mercer  Procedure(s) Performed: Procedure(s): COLONOSCOPY WITH PROPOFOL (N/A) ESOPHAGOGASTRODUODENOSCOPY (EGD) WITH PROPOFOL (N/A)  Anesthesia type:General  Patient location: PACU  Post pain: Pain level controlled  Post assessment: Post-op Vital signs reviewed, Patient's Cardiovascular Status Stable, Respiratory Function Stable, Patent Airway and No signs of Nausea or vomiting  Post vital signs: Reviewed and stable  Last Vitals:  Filed Vitals:   08/11/15 0908  BP: 94/57  Pulse: 65  Temp: 35.7 C  Resp: 11    Level of consciousness: awake, alert  and patient cooperative  Complications: No apparent anesthesia complications

## 2015-08-11 NOTE — Anesthesia Preprocedure Evaluation (Signed)
Anesthesia Evaluation  Patient identified by MRN, date of birth, ID band Patient awake    Reviewed: Allergy & Precautions, NPO status , Patient's Chart, lab work & pertinent test results  Airway Mallampati: II       Dental no notable dental hx.    Pulmonary neg pulmonary ROS,    Pulmonary exam normal        Cardiovascular hypertension, Pt. on medications Normal cardiovascular exam     Neuro/Psych    GI/Hepatic Neg liver ROS, GERD  ,  Endo/Other  diabetes, Type 1, Insulin Dependent  Renal/GU      Musculoskeletal   Abdominal Normal abdominal exam  (+)   Peds  Hematology   Anesthesia Other Findings   Reproductive/Obstetrics                             Anesthesia Physical Anesthesia Plan  ASA: II  Anesthesia Plan: General   Post-op Pain Management:    Induction: Intravenous  Airway Management Planned: Nasal Cannula  Additional Equipment:   Intra-op Plan:   Post-operative Plan:   Informed Consent: I have reviewed the patients History and Physical, chart, labs and discussed the procedure including the risks, benefits and alternatives for the proposed anesthesia with the patient or authorized representative who has indicated his/her understanding and acceptance.     Plan Discussed with: Surgeon  Anesthesia Plan Comments:         Anesthesia Quick Evaluation

## 2015-08-12 LAB — SURGICAL PATHOLOGY

## 2015-08-13 ENCOUNTER — Encounter: Payer: Self-pay | Admitting: Gastroenterology

## 2015-09-09 ENCOUNTER — Ambulatory Visit (INDEPENDENT_AMBULATORY_CARE_PROVIDER_SITE_OTHER): Payer: Medicare PPO | Admitting: Internal Medicine

## 2015-09-09 ENCOUNTER — Encounter: Payer: Self-pay | Admitting: Internal Medicine

## 2015-09-09 VITALS — BP 132/75 | HR 86 | Temp 98.3°F | Ht 62.0 in | Wt 147.2 lb

## 2015-09-09 DIAGNOSIS — K219 Gastro-esophageal reflux disease without esophagitis: Secondary | ICD-10-CM

## 2015-09-09 DIAGNOSIS — Z794 Long term (current) use of insulin: Secondary | ICD-10-CM | POA: Diagnosis not present

## 2015-09-09 DIAGNOSIS — E1140 Type 2 diabetes mellitus with diabetic neuropathy, unspecified: Secondary | ICD-10-CM

## 2015-09-09 DIAGNOSIS — M792 Neuralgia and neuritis, unspecified: Secondary | ICD-10-CM

## 2015-09-09 DIAGNOSIS — I10 Essential (primary) hypertension: Secondary | ICD-10-CM | POA: Diagnosis not present

## 2015-09-09 DIAGNOSIS — E785 Hyperlipidemia, unspecified: Secondary | ICD-10-CM

## 2015-09-09 LAB — COMPREHENSIVE METABOLIC PANEL
ALBUMIN: 4.3 g/dL (ref 3.5–5.2)
ALT: 19 U/L (ref 0–35)
AST: 19 U/L (ref 0–37)
Alkaline Phosphatase: 77 U/L (ref 39–117)
BILIRUBIN TOTAL: 0.3 mg/dL (ref 0.2–1.2)
BUN: 19 mg/dL (ref 6–23)
CALCIUM: 10.1 mg/dL (ref 8.4–10.5)
CHLORIDE: 99 meq/L (ref 96–112)
CO2: 33 meq/L — AB (ref 19–32)
CREATININE: 0.67 mg/dL (ref 0.40–1.20)
GFR: 92.39 mL/min (ref >60.00)
Glucose, Bld: 156 mg/dL — ABNORMAL HIGH (ref 70–99)
Potassium: 4.8 mEq/L (ref 3.5–5.1)
SODIUM: 139 meq/L (ref 135–145)
Total Protein: 7.3 g/dL (ref 6.0–8.3)

## 2015-09-09 LAB — MICROALBUMIN / CREATININE URINE RATIO
CREATININE, U: 183.9 mg/dL
MICROALB UR: 2 mg/dL — AB (ref 0.0–1.9)
MICROALB/CREAT RATIO: 1.1 mg/g (ref 0.0–30.0)

## 2015-09-09 LAB — LIPID PANEL
CHOL/HDL RATIO: 4
CHOLESTEROL: 130 mg/dL (ref 0–200)
HDL: 34.3 mg/dL — ABNORMAL LOW (ref >39.00)
NonHDL: 95.98
Triglycerides: 302 mg/dL — ABNORMAL HIGH (ref 0.0–149.0)
VLDL: 60.4 mg/dL — AB (ref 0.0–40.0)

## 2015-09-09 LAB — LDL CHOLESTEROL, DIRECT: LDL DIRECT: 59 mg/dL

## 2015-09-09 LAB — HEMOGLOBIN A1C: Hgb A1c MFr Bld: 7.3 % — ABNORMAL HIGH (ref 4.6–6.5)

## 2015-09-09 MED ORDER — TRAMADOL HCL 50 MG PO TABS: 50.0000 mg | ORAL_TABLET | Freq: Three times a day (TID) | ORAL | Status: DC | PRN

## 2015-09-09 MED ORDER — METFORMIN HCL 1000 MG PO TABS: 1000.0000 mg | ORAL_TABLET | Freq: Two times a day (BID) | ORAL | Status: DC

## 2015-09-09 MED ORDER — INSULIN GLARGINE 100 UNIT/ML SOLOSTAR PEN: 50.0000 [IU] | PEN_INJECTOR | Freq: Every day | SUBCUTANEOUS | Status: DC

## 2015-09-09 NOTE — Progress Notes (Signed)
Subjective:    Patient ID: Anita Mercer, female    DOB: [birth year], 69 y.o.   MRN: [id number]  HPI  69YO female presents for follow up.  DM - Mostly 70-100 fasting. Compliant with medication.  GERD - Recently underwent upper endoscopy and colonoscopy. Unhappy with physician who performed procedure because of allergy to Prilosec. Changed to Zantac. Continues to have some reflux symptoms of epigastric pain and belching at times. Has tried to identify a food trigger but unable to. No NV. Appetite good. Would like to review results of endoscopy.  Neuropathy - Symptoms well controlled with Tramadol. Aching pain in legs exacerbated by increased physical activity such as gardening, but improved with rest.   Lab Results  Component Value Date   HGBA1C 7.7* 06/04/2015     Wt Readings from Last 3 Encounters:  09/09/15 147 lb 4 oz (66.792 kg)  08/11/15 145 lb 5 oz (65.913 kg)  06/04/15 150 lb 4 oz (68.153 kg)   BP Readings from Last 3 Encounters:  09/09/15 132/75  08/11/15 121/94  06/04/15 130/75    Past Medical History  Diagnosis Date  . RMSF [facility] spotted fever)   . Diabetes mellitus 2000  . Hypertension   . Hyperlipidemia   . Neuromuscular disorder (HCC)     neuropathy   Family History  Problem Relation Age of Onset  . Adopted: Yes   Past Surgical History  Procedure Laterality Date  . Cholecystectomy    . Abdominal hysterectomy    . Foot surgery      Dr. Paulla Dolly, bone spurs  . Cataract extraction Bilateral 10/18/11  04/04/13  . Colonoscopy with propofol N/A 08/11/2015    Procedure: COLONOSCOPY WITH PROPOFOL;  Surgeon: Lollie Sails, MD;  Location: [facility] ENDOSCOPY;  Service: Endoscopy;  Laterality: N/A;  . Esophagogastroduodenoscopy (egd) with propofol N/A 08/11/2015    Procedure: ESOPHAGOGASTRODUODENOSCOPY (EGD) WITH PROPOFOL;  Surgeon: Lollie Sails, MD;  Location: [facility] ENDOSCOPY;  Service: Endoscopy;  Laterality: N/A;   Social History   Social  History  . Marital Status: Married    Spouse Name: N/A  . Number of Children: 1  . Years of Education: N/A   Occupational History  . 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 Smoking status: Never Smoker   . Smokeless tobacco: Never Used  . Alcohol Use: No  . Drug Use: No  . Sexual Activity: Not Asked   Other Topics Concern  . None   Social History Narrative   Lives in [place] with husband. Moved from [place]. Orig from [place]. Daughter lives in [place]. Dogs at home. Engineer, mining at [employer]. Hobbies - stained glass, woodcarving.      Diet: regular, limited carbs. Daily Caffeine Use:  Coffee 2-3   Regular Exercise -  Daily, yardwork    Review of Systems  Constitutional: Negative for fever, chills, appetite change, fatigue and unexpected weight change.  Eyes: Negative for visual disturbance.  Respiratory: Negative for shortness of breath.   Cardiovascular: Negative for chest pain and leg swelling.  Gastrointestinal: Positive for abdominal pain. Negative for nausea, vomiting, diarrhea, constipation and abdominal distention.  Musculoskeletal: Positive for myalgias and arthralgias.  Skin: Negative for color change and rash.  Neurological: Negative for weakness.  Hematological: Negative for adenopathy. Does not bruise/bleed easily.  Psychiatric/Behavioral: Negative for sleep disturbance and dysphoric mood. The patient is not nervous/anxious.  Objective:    BP 132/75 mmHg  Pulse 86  Temp(Src) 98.3 F (36.8 C) (Oral)  Ht 5\' 2"  (1.575 m)  Wt 147 lb 4 oz (66.792 kg)  BMI 26.93 kg/m2  SpO2 99% Physical Exam  Constitutional: She is oriented to person, place, and time. She appears well-developed and well-nourished. No distress.  HENT:  Head: Normocephalic and atraumatic.  Right Ear: External ear normal.  Left Ear: External ear normal.  Nose: Nose normal.  Mouth/Throat: Oropharynx is clear and moist. No  oropharyngeal exudate.  Eyes: Conjunctivae are normal. Pupils are equal, round, and reactive to light. Right eye exhibits no discharge. Left eye exhibits no discharge. No scleral icterus.  Neck: Normal range of motion. Neck supple. No tracheal deviation present. No thyromegaly present.  Cardiovascular: Normal rate, regular rhythm, normal heart sounds and intact distal pulses.  Exam reveals no gallop and no friction rub.   No murmur heard. Pulmonary/Chest: Effort normal and breath sounds normal. No respiratory distress. She has no wheezes. She has no rales. She exhibits no tenderness.  Musculoskeletal: Normal range of motion. She exhibits no edema or tenderness.  Lymphadenopathy:    She has no cervical adenopathy.  Neurological: She is alert and oriented to person, place, and time. No cranial nerve deficit. She exhibits normal muscle tone. Coordination normal.  Skin: Skin is warm and dry. No rash noted. She is not diaphoretic. No erythema. No pallor.  Psychiatric: She has a normal mood and affect. Her behavior is normal. Judgment and thought content normal.          Assessment & Plan:   Problem List Items Addressed This Visit      Unprioritized   Diabetes mellitus type 2, controlled ([place]) - Primary    A1c with labs today. Continue current medications.      Relevant Medications   Insulin Glargine (LANTUS SOLOSTAR) 100 UNIT/ML Solostar Pen   metFORMIN (GLUCOPHAGE) 1000 MG tablet   Other Relevant Orders   Comprehensive metabolic panel   Hemoglobin A1c   Lipid panel   Microalbumin / creatinine urine ratio   GERD (gastroesophageal reflux disease)    Over 5min of which >50% spent in face-to-face contact with patient and her husband discussing plan of care, reviewing information from recent GI evaluation.  Will set up second opinion with GI. She had allergic reaction to PPI - Omeprazole. Will continue Zantac for now. Discussed possibly adding Carafate but will hold off for now. Follow  up 3 months here and prn.       Relevant Orders   Ambulatory referral to Gastroenterology   Hyperlipidemia    Lipids with labs today. Continue Simvastatin      Hypertension    BP Readings from Last 3 Encounters:  09/09/15 132/75  08/11/15 121/94  06/04/15 130/75   BP well controlled. Renal function with labs today. Continue current medications.      Neuropathic pain    Symptoms well controlled with Tramadol. Will continue.          Return in about 3 months (around 12/10/2015) for Recheck of Diabetes.

## 2015-09-09 NOTE — Assessment & Plan Note (Signed)
BP Readings from Last 3 Encounters:  09/09/15 132/75  08/11/15 121/94  06/04/15 130/75   BP well controlled. Renal function with labs today. Continue current medications.

## 2015-09-09 NOTE — Assessment & Plan Note (Signed)
A1c with labs today. Continue current medications. 

## 2015-09-09 NOTE — Progress Notes (Signed)
Pre visit review using our clinic review tool, if applicable. No additional management support is needed unless otherwise documented below in the visit note. 

## 2015-09-09 NOTE — Assessment & Plan Note (Signed)
Lipids with labs today. Continue Simvastatin

## 2015-09-09 NOTE — Assessment & Plan Note (Signed)
Symptoms well controlled with Tramadol. Will continue.

## 2015-09-09 NOTE — Patient Instructions (Signed)
Labs today.   Follow up in 3 months.  

## 2015-09-09 NOTE — Assessment & Plan Note (Signed)
Over 60min of which >50% spent in face-to-face contact with patient and her husband discussing plan of care, reviewing information from recent GI evaluation.  Will set up second opinion with GI. She had allergic reaction to PPI - Omeprazole. Will continue Zantac for now. Discussed possibly adding Carafate but will hold off for now. Follow up 3 months here and prn.

## 2015-10-31 ENCOUNTER — Other Ambulatory Visit: Payer: Self-pay | Admitting: Internal Medicine

## 2015-12-10 ENCOUNTER — Telehealth: Payer: Self-pay

## 2015-12-10 ENCOUNTER — Ambulatory Visit (INDEPENDENT_AMBULATORY_CARE_PROVIDER_SITE_OTHER): Payer: Medicare Other | Admitting: Internal Medicine

## 2015-12-10 ENCOUNTER — Encounter: Payer: Self-pay | Admitting: Internal Medicine

## 2015-12-10 VITALS — BP 130/80 | HR 85 | Temp 98.3°F | Ht 62.0 in | Wt 149.5 lb

## 2015-12-10 DIAGNOSIS — K219 Gastro-esophageal reflux disease without esophagitis: Secondary | ICD-10-CM | POA: Diagnosis not present

## 2015-12-10 DIAGNOSIS — I10 Essential (primary) hypertension: Secondary | ICD-10-CM

## 2015-12-10 DIAGNOSIS — Z794 Long term (current) use of insulin: Secondary | ICD-10-CM

## 2015-12-10 DIAGNOSIS — E785 Hyperlipidemia, unspecified: Secondary | ICD-10-CM | POA: Diagnosis not present

## 2015-12-10 DIAGNOSIS — E1140 Type 2 diabetes mellitus with diabetic neuropathy, unspecified: Secondary | ICD-10-CM

## 2015-12-10 LAB — HEMOGLOBIN A1C: Hgb A1c MFr Bld: 7.7 % — ABNORMAL HIGH (ref 4.6–6.5)

## 2015-12-10 LAB — COMPREHENSIVE METABOLIC PANEL
ALK PHOS: 84 U/L (ref 39–117)
ALT: 17 U/L (ref 0–35)
AST: 16 U/L (ref 0–37)
Albumin: 4.1 g/dL (ref 3.5–5.2)
BILIRUBIN TOTAL: 0.2 mg/dL (ref 0.2–1.2)
BUN: 21 mg/dL (ref 6–23)
CO2: 31 meq/L (ref 19–32)
CREATININE: 0.73 mg/dL (ref 0.40–1.20)
Calcium: 10.1 mg/dL (ref 8.4–10.5)
Chloride: 100 mEq/L (ref 96–112)
GFR: 83.63 mL/min (ref >60.00)
GLUCOSE: 164 mg/dL — AB (ref 70–99)
Potassium: 4.3 mEq/L (ref 3.5–5.1)
Sodium: 139 mEq/L (ref 135–145)
TOTAL PROTEIN: 7.4 g/dL (ref 6.0–8.3)

## 2015-12-10 LAB — LIPID PANEL
CHOL/HDL RATIO: 4
Cholesterol: 133 mg/dL (ref 0–200)
HDL: 35 mg/dL — ABNORMAL LOW (ref >39.00)
NONHDL: 97.64
Triglycerides: 211 mg/dL — ABNORMAL HIGH (ref 0.0–149.0)
VLDL: 42.2 mg/dL — ABNORMAL HIGH (ref 0.0–40.0)

## 2015-12-10 LAB — MICROALBUMIN / CREATININE URINE RATIO
CREATININE, U: 116.6 mg/dL
MICROALB UR: 1 mg/dL (ref 0.0–1.9)
MICROALB/CREAT RATIO: 0.9 mg/g (ref 0.0–30.0)

## 2015-12-10 LAB — HM DIABETES FOOT EXAM: HM Diabetic Foot Exam: NORMAL

## 2015-12-10 LAB — LDL CHOLESTEROL, DIRECT: LDL DIRECT: 71 mg/dL

## 2015-12-10 MED ORDER — LISINOPRIL 10 MG PO TABS
10.0000 mg | ORAL_TABLET | Freq: Every day | ORAL | Status: DC
Start: 2015-12-10 — End: 2016-10-12

## 2015-12-10 MED ORDER — SIMVASTATIN 20 MG PO TABS: 20.0000 mg | ORAL_TABLET | Freq: Every day | ORAL | Status: DC

## 2015-12-10 MED ORDER — RANITIDINE HCL 150 MG PO TABS: 150.0000 mg | ORAL_TABLET | Freq: Two times a day (BID) | ORAL | Status: DC

## 2015-12-10 MED ORDER — GLUCOSE BLOOD VI STRP: ORAL_STRIP | Status: DC

## 2015-12-10 MED ORDER — TRAMADOL HCL 50 MG PO TABS: 50.0000 mg | ORAL_TABLET | Freq: Three times a day (TID) | ORAL | Status: DC | PRN

## 2015-12-10 NOTE — Assessment & Plan Note (Signed)
Symptoms well controlled with Zantac. Will continue.

## 2015-12-10 NOTE — Progress Notes (Signed)
Subjective:    Patient ID: Anita Mercer, female    DOB: 1946/10/19, 70 y.o.   MRN: [id number]  HPI  69YO female presents for follow up.  DM - BG fasting 70s-low 100s. Post-prandial near 100-max 170. Eating schedule changed with recent home remodel.  Upset about referral back to [facility]. GERD - Zantac helps with reflux symptoms.  Has occasional constipation. Using Miralax with some improvement. Also tried Milk of Magnesia, but this worsened reflux.    Wt Readings from Last 3 Encounters:  12/10/15 149 lb 8 oz (67.813 kg)  09/09/15 147 lb 4 oz (66.792 kg)  08/11/15 145 lb 5 oz (65.913 kg)   BP Readings from Last 3 Encounters:  12/10/15 130/80  09/09/15 132/75  08/11/15 121/94    Past Medical History  Diagnosis Date  . RMSF [facility] spotted fever)   . Diabetes mellitus 2000  . Hypertension   . Hyperlipidemia   . Neuromuscular disorder (HCC)     neuropathy   Family History  Problem Relation Age of Onset  . Adopted: Yes   Past Surgical History  Procedure Laterality Date  . Cholecystectomy    . Abdominal hysterectomy    . Foot surgery      Dr. Paulla Dolly, bone spurs  . Cataract extraction Bilateral 10/18/11  04/04/13  . Colonoscopy with propofol N/A 08/11/2015    Procedure: COLONOSCOPY WITH PROPOFOL;  Surgeon: Lollie Sails, MD;  Location: [facility] ENDOSCOPY;  Service: Endoscopy;  Laterality: N/A;  . Esophagogastroduodenoscopy (egd) with propofol N/A 08/11/2015    Procedure: ESOPHAGOGASTRODUODENOSCOPY (EGD) WITH PROPOFOL;  Surgeon: Lollie Sails, MD;  Location: [facility] ENDOSCOPY;  Service: Endoscopy;  Laterality: N/A;   Social History   Social History  . Marital Status: Married    Spouse Name: N/A  . Number of Children: 1  . Years of Education: N/A   Occupational History  . 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 Smoking status: Never Smoker   . Smokeless tobacco: Never Used  . Alcohol Use: No  . Drug  Use: No  . Sexual Activity: Not Asked   Other Topics Concern  . None   Social History Narrative   Lives in [place] with husband. Moved from [place]. Orig from [place]. Daughter lives in [place]. Dogs at home. Engineer, mining at [employer]. Hobbies - stained glass, woodcarving.      Diet: regular, limited carbs. Daily Caffeine Use:  Coffee 2-3   Regular Exercise -  Daily, yardwork    Review of Systems  Constitutional: Negative for fever, chills, appetite change, fatigue and unexpected weight change.  Eyes: Negative for visual disturbance.  Respiratory: Negative for shortness of breath.   Cardiovascular: Negative for chest pain and leg swelling.  Gastrointestinal: Negative for abdominal pain.  Skin: Negative for color change and rash.  Hematological: Negative for adenopathy. Does not bruise/bleed easily.  Psychiatric/Behavioral: Negative for dysphoric mood. The patient is not nervous/anxious.        Objective:    BP 130/80 mmHg  Pulse 85  Temp(Src) 98.3 F (36.8 C) (Oral)  Ht 5\' 2"  (1.575 m)  Wt 149 lb 8 oz (67.813 kg)  BMI 27.34 kg/m2  SpO2 100% Physical Exam  Constitutional: She is oriented to person, place, and time. She appears well-developed and well-nourished. No distress.  HENT:  Head: Normocephalic and atraumatic.  Right Ear: External ear normal.  Left Ear: External ear normal.  Nose: Nose normal.  Mouth/Throat: Oropharynx is clear and moist. No oropharyngeal exudate.  Eyes: Conjunctivae are normal. Pupils are equal, round, and reactive to light. Right eye exhibits no discharge. Left eye exhibits no discharge. No scleral icterus.  Neck: Normal range of motion. Neck supple. No tracheal deviation present. No thyromegaly present.  Cardiovascular: Normal rate, regular rhythm, normal heart sounds and intact distal pulses.  Exam reveals no gallop and no friction rub.   No murmur heard. Pulmonary/Chest: Effort normal and breath sounds normal. No respiratory  distress. She has no wheezes. She has no rales. She exhibits no tenderness.  Musculoskeletal: Normal range of motion. She exhibits no edema or tenderness.  Lymphadenopathy:    She has no cervical adenopathy.  Neurological: She is alert and oriented to person, place, and time. No cranial nerve deficit. She exhibits normal muscle tone. Coordination normal.  Skin: Skin is warm and dry. No rash noted. She is not diaphoretic. No erythema. No pallor.  Psychiatric: She has a normal mood and affect. Her behavior is normal. Judgment and thought content normal.          Assessment & Plan:   Problem List Items Addressed This Visit      Unprioritized   Diabetes mellitus type 2, controlled ([place]) - Primary    Will check A1c with labs. Continue Lantus and Metformin.      Relevant Medications   simvastatin (ZOCOR) 20 MG tablet   lisinopril (PRINIVIL,ZESTRIL) 10 MG tablet   Other Relevant Orders   Comprehensive metabolic panel   Hemoglobin A1c   Lipid panel   Microalbumin / creatinine urine ratio   GERD (gastroesophageal reflux disease)    Symptoms well controlled with Zantac. Will continue.      Hyperlipidemia    Will check lipids with labs. Continue Simvastatin.      Relevant Medications   simvastatin (ZOCOR) 20 MG tablet   lisinopril (PRINIVIL,ZESTRIL) 10 MG tablet   Hypertension    BP Readings from Last 3 Encounters:  12/10/15 130/80  09/09/15 132/75  08/11/15 121/94   BP well controlled. Renal function with labs. Continue current medication.      Relevant Medications   simvastatin (ZOCOR) 20 MG tablet   lisinopril (PRINIVIL,ZESTRIL) 10 MG tablet       Return in about 3 months (around 03/09/2016) for Recheck of Diabetes.

## 2015-12-10 NOTE — Telephone Encounter (Signed)
Pharmacy called for pt's Rx for Zantac. Dr.Walker stated that it could be filled, the pharmacy was called with verbal order./tvw

## 2015-12-10 NOTE — Progress Notes (Signed)
Pre visit review using our clinic review tool, if applicable. No additional management support is needed unless otherwise documented below in the visit note. 

## 2015-12-10 NOTE — Patient Instructions (Signed)
Labs today.  Follow up in 3 months and sooner as needed. 

## 2015-12-10 NOTE — Assessment & Plan Note (Signed)
Will check A1c with labs. Continue Lantus and Metformin.

## 2015-12-10 NOTE — Assessment & Plan Note (Signed)
BP Readings from Last 3 Encounters:  12/10/15 130/80  09/09/15 132/75  08/11/15 121/94   BP well controlled. Renal function with labs. Continue current medication.

## 2015-12-10 NOTE — Assessment & Plan Note (Signed)
Will check lipids with labs. Continue Simvastatin. 

## 2015-12-11 ENCOUNTER — Other Ambulatory Visit: Payer: Self-pay | Admitting: Internal Medicine

## 2015-12-27 ENCOUNTER — Other Ambulatory Visit: Payer: Self-pay | Admitting: Internal Medicine

## 2016-01-27 ENCOUNTER — Encounter: Payer: Self-pay | Admitting: *Deleted

## 2016-03-15 ENCOUNTER — Ambulatory Visit (INDEPENDENT_AMBULATORY_CARE_PROVIDER_SITE_OTHER): Payer: Medicare Other | Admitting: Internal Medicine

## 2016-03-15 ENCOUNTER — Encounter: Payer: Self-pay | Admitting: Internal Medicine

## 2016-03-15 VITALS — BP 158/72 | HR 79 | Ht 62.0 in | Wt 150.8 lb

## 2016-03-15 DIAGNOSIS — I10 Essential (primary) hypertension: Secondary | ICD-10-CM

## 2016-03-15 DIAGNOSIS — E1140 Type 2 diabetes mellitus with diabetic neuropathy, unspecified: Secondary | ICD-10-CM | POA: Diagnosis not present

## 2016-03-15 DIAGNOSIS — Z794 Long term (current) use of insulin: Secondary | ICD-10-CM

## 2016-03-15 DIAGNOSIS — Z1159 Encounter for screening for other viral diseases: Secondary | ICD-10-CM | POA: Insufficient documentation

## 2016-03-15 DIAGNOSIS — E785 Hyperlipidemia, unspecified: Secondary | ICD-10-CM | POA: Diagnosis not present

## 2016-03-15 LAB — POCT URINALYSIS DIPSTICK
BILIRUBIN UA: NEGATIVE
GLUCOSE UA: NEGATIVE
Ketones, UA: NEGATIVE
NITRITE UA: NEGATIVE
Protein, UA: NEGATIVE
Spec Grav, UA: 1.02
Urobilinogen, UA: 0.2
pH, UA: 5

## 2016-03-15 LAB — COMPREHENSIVE METABOLIC PANEL
ALK PHOS: 68 U/L (ref 39–117)
ALT: 21 U/L (ref 0–35)
AST: 22 U/L (ref 0–37)
Albumin: 4.2 g/dL (ref 3.5–5.2)
BUN: 13 mg/dL (ref 6–23)
CHLORIDE: 103 meq/L (ref 96–112)
CO2: 30 meq/L (ref 19–32)
Calcium: 9.9 mg/dL (ref 8.4–10.5)
Creatinine, Ser: 0.64 mg/dL (ref 0.40–1.20)
GFR: 97.26 mL/min (ref >60.00)
GLUCOSE: 98 mg/dL (ref 70–99)
POTASSIUM: 4.1 meq/L (ref 3.5–5.1)
SODIUM: 140 meq/L (ref 135–145)
TOTAL PROTEIN: 7.3 g/dL (ref 6.0–8.3)
Total Bilirubin: 0.3 mg/dL (ref 0.2–1.2)

## 2016-03-15 LAB — LIPID PANEL
CHOL/HDL RATIO: 3
Cholesterol: 124 mg/dL (ref 0–200)
HDL: 35.7 mg/dL — AB (ref >39.00)
LDL CALC: 57 mg/dL (ref 0–99)
NONHDL: 88.26
Triglycerides: 155 mg/dL — ABNORMAL HIGH (ref 0.0–149.0)
VLDL: 31 mg/dL (ref 0.0–40.0)

## 2016-03-15 LAB — HEMOGLOBIN A1C: Hgb A1c MFr Bld: 7.6 % — ABNORMAL HIGH (ref 4.6–6.5)

## 2016-03-15 LAB — MICROALBUMIN / CREATININE URINE RATIO
Creatinine,U: 107.5 mg/dL
Microalb Creat Ratio: 1.2 mg/g (ref 0.0–30.0)
Microalb, Ur: 1.3 mg/dL (ref 0.0–1.9)

## 2016-03-15 MED ORDER — TRAMADOL HCL 50 MG PO TABS: 50.0000 mg | ORAL_TABLET | Freq: Three times a day (TID) | ORAL | Status: DC | PRN

## 2016-03-15 NOTE — Patient Instructions (Signed)
Labs today.  Follow up for physical in next few weeks.

## 2016-03-15 NOTE — Progress Notes (Signed)
Subjective:    Patient ID: Anita Mercer, female    DOB: [birth year], 70 y.o.   MRN: [id number]  HPI 69YO female presents for followup.  Few weeks ago had some urine odor and cloudy urine. Did OTC test and this was neg for UTI. Symptoms improved some. However still having some aching with urination. No fever, chills, flank pain. No hematuria.  DM - BG mostly near 100. Compliant with medication.  Continues to have some neuropathic pain in hands and feet. Occasional swelling in hands that comes and goes. Legs feel heavy at times which is chronic. 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   Wt Readings from Last 3 Encounters:  03/15/16 150 lb 12.8 oz (68.402 kg)  12/10/15 149 lb 8 oz (67.813 kg)  09/09/15 147 lb 4 oz (66.792 kg)   BP Readings from Last 3 Encounters:  03/15/16 158/72  12/10/15 130/80  09/09/15 132/75    Past Medical History  Diagnosis Date  . RMSF [facility] spotted fever)   . Diabetes mellitus 2000  . Hypertension   . Hyperlipidemia   . Neuromuscular disorder (HCC)     neuropathy   Family History  Problem Relation Age of Onset  . Adopted: Yes   Past Surgical History  Procedure Laterality Date  . Cholecystectomy    . Abdominal hysterectomy    . Foot surgery      Dr. Paulla Dolly, bone spurs  . Cataract extraction Bilateral 10/18/11  04/04/13  . Colonoscopy with propofol N/A 08/11/2015    Procedure: COLONOSCOPY WITH PROPOFOL;  Surgeon: Lollie Sails, MD;  Location: [facility] ENDOSCOPY;  Service: Endoscopy;  Laterality: N/A;  . Esophagogastroduodenoscopy (egd) with propofol N/A 08/11/2015    Procedure: ESOPHAGOGASTRODUODENOSCOPY (EGD) WITH PROPOFOL;  Surgeon: Lollie Sails, MD;  Location: [facility] ENDOSCOPY;  Service: Endoscopy;  Laterality: N/A;   Social History   Social History  . Marital Status: Married    Spouse Name: N/A  . Number of Children: 1  . Years of Education: N/A   Occupational History  . 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 Smoking status: Never Smoker   . Smokeless tobacco: Never Used  . Alcohol Use: No  . Drug Use: No  . Sexual Activity: Not Asked   Other Topics Concern  . None   Social History Narrative   Lives in [place] with husband. Moved from [place]. Orig from [place]. Daughter lives in [place]. Dogs at home. Engineer, mining at [employer]. Hobbies - stained glass, woodcarving.      Diet: regular, limited carbs. Daily Caffeine Use:  Coffee 2-3   Regular Exercise -  Daily, yardwork    Review of Systems  Constitutional: Negative for fever, chills, appetite change, fatigue and unexpected weight change.  Eyes: Negative for visual disturbance.  Respiratory: Negative for shortness of breath.   Cardiovascular: Negative for chest pain and leg swelling.  Gastrointestinal: Negative for nausea, vomiting, abdominal pain, diarrhea and constipation.  Genitourinary: Positive for dysuria. Negative for urgency, frequency, vaginal bleeding, vaginal discharge, difficulty urinating, vaginal pain and pelvic pain.  Musculoskeletal: Positive for myalgias. Negative for arthralgias.  Skin: Negative for color change and rash.  Neurological: Negative for weakness.  Hematological: Negative for adenopathy. Does not bruise/bleed easily.  Psychiatric/Behavioral: Negative for sleep disturbance and dysphoric mood. The patient is not nervous/anxious.        Objective:    BP  158/72 mmHg  Pulse 79  Ht 5\' 2"  (1.575 m)  Wt 150 lb 12.8 oz (68.402 kg)  BMI 27.57 kg/m2  SpO2 98% Physical Exam  Constitutional: She is oriented to person, place, and time. She appears well-developed and well-nourished. No distress.  HENT:  Head: Normocephalic and atraumatic.  Right Ear: External ear normal.  Left Ear: External ear normal.  Nose: Nose normal.  Mouth/Throat: Oropharynx is clear and moist. No oropharyngeal exudate.  Eyes: Conjunctivae are normal. Pupils are equal,  round, and reactive to light. Right eye exhibits no discharge. Left eye exhibits no discharge. No scleral icterus.  Neck: Normal range of motion. Neck supple. No tracheal deviation present. No thyromegaly present.  Cardiovascular: Normal rate, regular rhythm, normal heart sounds and intact distal pulses.  Exam reveals no gallop and no friction rub.   No murmur heard. Pulmonary/Chest: Effort normal and breath sounds normal. No respiratory distress. She has no wheezes. She has no rales. She exhibits no tenderness.  Musculoskeletal: Normal range of motion. She exhibits no edema or tenderness.  Lymphadenopathy:    She has no cervical adenopathy.  Neurological: She is alert and oriented to person, place, and time. No cranial nerve deficit. She exhibits normal muscle tone. Coordination normal.  Skin: Skin is warm and dry. Rash noted. Rash is papular (tan-brown paplules over chest and abdomen, particularly under breasts. c/w SK). She is not diaphoretic. No erythema. No pallor.  Psychiatric: She has a normal mood and affect. Her behavior is normal. Judgment and thought content normal.          Assessment & Plan:   Problem List Items Addressed This Visit      Unprioritized   Diabetes mellitus type 2, controlled ([place]) - Primary    BG well controlled by report. A1c with labs. Continue current medication.      Relevant Orders   Comprehensive metabolic panel   Hemoglobin A1c   Lipid panel   Microalbumin / creatinine urine ratio   POCT urinalysis dipstick   Hyperlipidemia    Will check lipids and LFTs with labs. Continue Simvastatin.      Hypertension    BP Readings from Last 3 Encounters:  03/15/16 158/72  12/10/15 130/80  09/09/15 132/75   BP slightly elevated today but generally well controlled. Renal function with labs.      Need for hepatitis C screening test   Relevant Orders   Hepatitis C antibody       Return in about 1 week (around 03/22/2016) for Physical.  Ronette Deter, MD [facility]

## 2016-03-15 NOTE — Assessment & Plan Note (Signed)
Will check lipids and LFTs with labs. Continue Simvastatin. 

## 2016-03-15 NOTE — Assessment & Plan Note (Signed)
BP Readings from Last 3 Encounters:  03/15/16 158/72  12/10/15 130/80  09/09/15 132/75   BP slightly elevated today but generally well controlled. Renal function with labs.

## 2016-03-15 NOTE — Addendum Note (Signed)
Addended by: Karlene Einstein D on: 03/15/2016 09:15 AM   Modules accepted: Orders

## 2016-03-15 NOTE — Assessment & Plan Note (Signed)
BG well controlled by report. A1c with labs. Continue current medication.

## 2016-03-16 LAB — HEPATITIS C ANTIBODY: HCV Ab: NEGATIVE

## 2016-03-17 LAB — URINE CULTURE: Colony Count: 70000

## 2016-03-26 ENCOUNTER — Telehealth: Payer: Self-pay | Admitting: *Deleted

## 2016-03-26 MED ORDER — RANITIDINE HCL 150 MG PO TABS: 150.0000 mg | ORAL_TABLET | Freq: Two times a day (BID) | ORAL | Status: DC

## 2016-03-26 NOTE — Telephone Encounter (Signed)
Rx sent to pharmacy   

## 2016-03-26 NOTE — Telephone Encounter (Signed)
Medication refill requested for ranitidine [facility]

## 2016-05-03 ENCOUNTER — Encounter: Payer: Self-pay | Admitting: Internal Medicine

## 2016-05-03 ENCOUNTER — Ambulatory Visit (INDEPENDENT_AMBULATORY_CARE_PROVIDER_SITE_OTHER): Payer: Medicare Other | Admitting: Internal Medicine

## 2016-05-03 VITALS — BP 140/68 | HR 89 | Ht 62.0 in | Wt 152.8 lb

## 2016-05-03 DIAGNOSIS — Z0000 Encounter for general adult medical examination without abnormal findings: Secondary | ICD-10-CM

## 2016-05-03 MED ORDER — RANITIDINE HCL 150 MG PO TABS: 150.0000 mg | ORAL_TABLET | Freq: Two times a day (BID) | ORAL | Status: DC

## 2016-05-03 NOTE — Assessment & Plan Note (Signed)
General medical exam normal today including breast exam. PAP and pelvic deferred given age and preference. Mammogram ordered. Colonoscopy UTD. Labs UTD. Immunizations UTD. Follow up in 05/2016 as scheduled.

## 2016-05-03 NOTE — Progress Notes (Signed)
Pre visit review using our clinic review tool, if applicable. No additional management support is needed unless otherwise documented below in the visit note. 

## 2016-05-03 NOTE — Progress Notes (Signed)
Subjective:    Patient ID: Anita Mercer, female    DOB: 1946/09/25, 70 y.o.   MRN: [id number]  HPI  69YO female presents for physical exam.  Feeling well. No concerns today. Scheduled for eye exam next week at [facility]. 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    Lab Results  Component Value Date   HGBA1C 7.6* 03/15/2016    Wt Readings from Last 3 Encounters:  05/03/16 152 lb 12.8 oz (69.31 kg)  03/15/16 150 lb 12.8 oz (68.402 kg)  12/10/15 149 lb 8 oz (67.813 kg)   BP Readings from Last 3 Encounters:  05/03/16 140/68  03/15/16 158/72  12/10/15 130/80    Past Medical History  Diagnosis Date  . RMSF [facility] spotted fever)   . Diabetes mellitus 2000  . Hypertension   . Hyperlipidemia   . Neuromuscular disorder (HCC)     neuropathy   Family History  Problem Relation Age of Onset  . Adopted: Yes   Past Surgical History  Procedure Laterality Date  . Cholecystectomy    . Abdominal hysterectomy    . Foot surgery      Dr. Paulla Dolly, bone spurs  . Cataract extraction Bilateral 10/18/11  04/04/13  . Colonoscopy with propofol N/A 08/11/2015    Procedure: COLONOSCOPY WITH PROPOFOL;  Surgeon: Lollie Sails, MD;  Location: [facility] ENDOSCOPY;  Service: Endoscopy;  Laterality: N/A;  . Esophagogastroduodenoscopy (egd) with propofol N/A 08/11/2015    Procedure: ESOPHAGOGASTRODUODENOSCOPY (EGD) WITH PROPOFOL;  Surgeon: Lollie Sails, MD;  Location: [facility] ENDOSCOPY;  Service: Endoscopy;  Laterality: N/A;   Social History   Social History  . Marital Status: Married    Spouse Name: N/A  . Number of Children: 1  . Years of Education: N/A   Occupational History  . 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 Smoking status: Never Smoker   . Smokeless tobacco: Never Used  . Alcohol Use: No  . Drug Use: No  . Sexual Activity: Not Asked   Other Topics Concern  . None   Social History Narrative   Lives in [place] with husband. Moved from [place]. Orig from [place]. Daughter lives in [place]. Dogs at home. Engineer, mining at [employer]. Hobbies - stained glass, woodcarving.      Diet: regular, limited carbs. Daily Caffeine Use:  Coffee 2-3   Regular Exercise -  Daily, yardwork    Review of Systems  Constitutional: Negative for fever, chills, appetite change, fatigue and unexpected weight change.  Eyes: Negative for visual disturbance.  Respiratory: Negative for shortness of breath.   Cardiovascular: Negative for chest pain and leg swelling.  Gastrointestinal: Negative for nausea, vomiting, abdominal pain, diarrhea and constipation.  Musculoskeletal: Negative for myalgias and arthralgias.  Skin: Negative for color change and rash.  Hematological: Negative for adenopathy. Does not bruise/bleed easily.  Psychiatric/Behavioral: Negative for sleep disturbance and dysphoric mood. The patient is not nervous/anxious.        Objective:    BP 140/68 mmHg  Pulse 89  Ht 5\' 2"  (1.575 m)  Wt 152 lb 12.8 oz (69.31 kg)  BMI 27.94 kg/m2  SpO2 98% Physical Exam  Constitutional: She is oriented to person, place, and time. She appears well-developed and well-nourished. No distress.  HENT:  Head: Normocephalic and atraumatic.  Right Ear: External ear normal.  Left Ear: External ear normal.  Nose: Nose normal.  Mouth/Throat: Oropharynx  is clear and moist. No oropharyngeal exudate.  Eyes: Conjunctivae are normal. Pupils are equal, round, and reactive to light. Right eye exhibits no discharge. Left eye exhibits no discharge. No scleral icterus.  Neck: Normal range of motion. Neck supple. No tracheal deviation present. No thyromegaly present.  Cardiovascular: Normal rate, regular rhythm, normal heart sounds and intact distal pulses.  Exam reveals no gallop and no friction rub.   No murmur heard. Pulmonary/Chest: Effort normal and breath sounds normal. No accessory muscle usage. No  tachypnea. No respiratory distress. She has no decreased breath sounds. She has no wheezes. She has no rales. She exhibits no tenderness. Right breast exhibits no inverted nipple, no mass, no nipple discharge, no skin change and no tenderness. Left breast exhibits no inverted nipple, no mass, no nipple discharge, no skin change and no tenderness. Breasts are symmetrical.  Abdominal: Soft. Bowel sounds are normal. She exhibits no distension and no mass. There is no tenderness. There is no rebound and no guarding.  Musculoskeletal: Normal range of motion. She exhibits no edema or tenderness.  Lymphadenopathy:    She has no cervical adenopathy.  Neurological: She is alert and oriented to person, place, and time. No cranial nerve deficit. She exhibits normal muscle tone. Coordination normal.  Skin: Skin is warm and dry. No rash noted. She is not diaphoretic. No erythema. No pallor.  Psychiatric: She has a normal mood and affect. Her behavior is normal. Judgment and thought content normal.          Assessment & Plan:   Problem List Items Addressed This Visit      Unprioritized   Routine general medical examination at a health care facility - Primary    General medical exam normal today including breast exam. PAP and pelvic deferred given age and preference. Mammogram ordered. Colonoscopy UTD. Labs UTD. Immunizations UTD. Follow up in 05/2016 as scheduled.      Relevant Orders   MM Digital Screening       Return if symptoms worsen or fail to improve.  Ronette Deter, MD Internal Medicine [facility]

## 2016-05-03 NOTE — Patient Instructions (Signed)
Health Maintenance, Female Adopting a healthy lifestyle and getting preventive care can go a long way to promote health and wellness. Talk with your health care provider about what schedule of regular examinations is right for you. This is a good chance for you to check in with your provider about disease prevention and staying healthy. In between checkups, there are plenty of things you can do on your own. Experts have done a lot of research about which lifestyle changes and preventive measures are most likely to keep you healthy. Ask your health care provider for more information. WEIGHT AND DIET  Eat a healthy diet  Be sure to include plenty of vegetables, fruits, low-fat dairy products, and lean protein.  Do not eat a lot of foods high in solid fats, added sugars, or salt.  Get regular exercise. This is one of the most important things you can do for your health.  Most adults should exercise for at least 150 minutes each week. The exercise should increase your heart rate and make you sweat (moderate-intensity exercise).  Most adults should also do strengthening exercises at least twice a week. This is in addition to the moderate-intensity exercise.  Maintain a healthy weight  Body mass index (BMI) is a measurement that can be used to identify possible weight problems. It estimates body fat based on height and weight. Your health care provider can help determine your BMI and help you achieve or maintain a healthy weight.  For females 20 years of age and older:   A BMI below 18.5 is considered underweight.  A BMI of 18.5 to 24.9 is normal.  A BMI of 25 to 29.9 is considered overweight.  A BMI of 30 and above is considered obese.  Watch levels of cholesterol and blood lipids  You should start having your blood tested for lipids and cholesterol at 71 years of age, then have this test every 5 years.  You may need to have your cholesterol levels checked more often if:  Your lipid  or cholesterol levels are high.  You are older than 71 years of age.  You are at high risk for heart disease.  CANCER SCREENING   Lung Cancer  Lung cancer screening is recommended for adults 55-80 years old who are at high risk for lung cancer because of a history of smoking.  A yearly low-dose CT scan of the lungs is recommended for people who:  Currently smoke.  Have quit within the past 15 years.  Have at least a 30-pack-year history of smoking. A pack year is smoking an average of one pack of cigarettes a day for 1 year.  Yearly screening should continue until it has been 15 years since you quit.  Yearly screening should stop if you develop a health problem that would prevent you from having lung cancer treatment.  Breast Cancer  Practice breast self-awareness. This means understanding how your breasts normally appear and feel.  It also means doing regular breast self-exams. Let your health care provider know about any changes, no matter how small.  If you are in your 20s or 30s, you should have a clinical breast exam (CBE) by a health care provider every 1-3 years as part of a regular health exam.  If you are 40 or older, have a CBE every year. Also consider having a breast X-ray (mammogram) every year.  If you have a family history of breast cancer, talk to your health care provider about genetic screening.  If you   are at high risk for breast cancer, talk to your health care provider about having an MRI and a mammogram every year.  Breast cancer gene (BRCA) assessment is recommended for women who have family members with BRCA-related cancers. BRCA-related cancers include:  Breast.  Ovarian.  Tubal.  Peritoneal cancers.  Results of the assessment will determine the need for genetic counseling and BRCA1 and BRCA2 testing. Cervical Cancer Your health care provider may recommend that you be screened regularly for cancer of the pelvic organs (ovaries, uterus, and  vagina). This screening involves a pelvic examination, including checking for microscopic changes to the surface of your cervix (Pap test). You may be encouraged to have this screening done every 3 years, beginning at age 21.  For women ages 30-65, health care providers may recommend pelvic exams and Pap testing every 3 years, or they may recommend the Pap and pelvic exam, combined with testing for human papilloma virus (HPV), every 5 years. Some types of HPV increase your risk of cervical cancer. Testing for HPV may also be done on women of any age with unclear Pap test results.  Other health care providers may not recommend any screening for nonpregnant women who are considered low risk for pelvic cancer and who do not have symptoms. Ask your health care provider if a screening pelvic exam is right for you.  If you have had past treatment for cervical cancer or a condition that could lead to cancer, you need Pap tests and screening for cancer for at least 20 years after your treatment. If Pap tests have been discontinued, your risk factors (such as having a new sexual partner) need to be reassessed to determine if screening should resume. Some women have medical problems that increase the chance of getting cervical cancer. In these cases, your health care provider may recommend more frequent screening and Pap tests. Colorectal Cancer  This type of cancer can be detected and often prevented.  Routine colorectal cancer screening usually begins at 71 years of age and continues through 71 years of age.  Your health care provider may recommend screening at an earlier age if you have risk factors for colon cancer.  Your health care provider may also recommend using home test kits to check for hidden blood in the stool.  A small camera at the end of a tube can be used to examine your colon directly (sigmoidoscopy or colonoscopy). This is done to check for the earliest forms of colorectal  cancer.  Routine screening usually begins at age 50.  Direct examination of the colon should be repeated every 5-10 years through 71 years of age. However, you may need to be screened more often if early forms of precancerous polyps or small growths are found. Skin Cancer  Check your skin from head to toe regularly.  Tell your health care provider about any new moles or changes in moles, especially if there is a change in a mole's shape or color.  Also tell your health care provider if you have a mole that is larger than the size of a pencil eraser.  Always use sunscreen. Apply sunscreen liberally and repeatedly throughout the day.  Protect yourself by wearing long sleeves, pants, a wide-brimmed hat, and sunglasses whenever you are outside. HEART DISEASE, DIABETES, AND HIGH BLOOD PRESSURE   High blood pressure causes heart disease and increases the risk of stroke. High blood pressure is more likely to develop in:  People who have blood pressure in the high end   of the normal range (130-139/85-89 mm Hg).  People who are overweight or obese.  People who are African American.  If you are 38-23 years of age, have your blood pressure checked every 3-5 years. If you are 61 years of age or older, have your blood pressure checked every year. You should have your blood pressure measured twice--once when you are at a hospital or clinic, and once when you are not at a hospital or clinic. Record the average of the two measurements. To check your blood pressure when you are not at a hospital or clinic, you can use:  An automated blood pressure machine at a pharmacy.  A home blood pressure monitor.  If you are between 45 years and 39 years old, ask your health care provider if you should take aspirin to prevent strokes.  Have regular diabetes screenings. This involves taking a blood sample to check your fasting blood sugar level.  If you are at a normal weight and have a low risk for diabetes,  have this test once every three years after 71 years of age.  If you are overweight and have a high risk for diabetes, consider being tested at a younger age or more often. PREVENTING INFECTION  Hepatitis B  If you have a higher risk for hepatitis B, you should be screened for this virus. You are considered at high risk for hepatitis B if:  You were born in a country where hepatitis B is common. Ask your health care provider which countries are considered high risk.  Your parents were born in a high-risk country, and you have not been immunized against hepatitis B (hepatitis B vaccine).  You have HIV or AIDS.  You use needles to inject street drugs.  You live with someone who has hepatitis B.  You have had sex with someone who has hepatitis B.  You get hemodialysis treatment.  You take certain medicines for conditions, including cancer, organ transplantation, and autoimmune conditions. Hepatitis C  Blood testing is recommended for:  Everyone born from [birth year] through 1966.  Anyone with known risk factors for hepatitis C. Sexually transmitted infections (STIs)  You should be screened for sexually transmitted infections (STIs) including gonorrhea and chlamydia if:  You are sexually active and are younger than 71 years of age.  You are older than 71 years of age and your health care provider tells you that you are at risk for this type of infection.  Your sexual activity has changed since you were last screened and you are at an increased risk for chlamydia or gonorrhea. Ask your health care provider if you are at risk.  If you do not have HIV, but are at risk, it may be recommended that you take a prescription medicine daily to prevent HIV infection. This is called pre-exposure prophylaxis (PrEP). You are considered at risk if:  You are sexually active and do not regularly use condoms or know the HIV status of your partner(s).  You take drugs by injection.  You are sexually  active with a partner who has HIV. Talk with your health care provider about whether you are at high risk of being infected with HIV. If you choose to begin PrEP, you should first be tested for HIV. You should then be tested every 3 months for as long as you are taking PrEP.  PREGNANCY   If you are premenopausal and you may become pregnant, ask your health care provider about preconception counseling.  If you may  become pregnant, take 400 to 800 micrograms (mcg) of folic acid every day.  If you want to prevent pregnancy, talk to your health care provider about birth control (contraception). OSTEOPOROSIS AND MENOPAUSE   Osteoporosis is a disease in which the bones lose minerals and strength with aging. This can result in serious bone fractures. Your risk for osteoporosis can be identified using a bone density scan.  If you are 61 years of age or older, or if you are at risk for osteoporosis and fractures, ask your health care provider if you should be screened.  Ask your health care provider whether you should take a calcium or vitamin D supplement to lower your risk for osteoporosis.  Menopause may have certain physical symptoms and risks.  Hormone replacement therapy may reduce some of these symptoms and risks. Talk to your health care provider about whether hormone replacement therapy is right for you.  HOME CARE INSTRUCTIONS   Schedule regular health, dental, and eye exams.  Stay current with your immunizations.   Do not use any tobacco products including cigarettes, chewing tobacco, or electronic cigarettes.  If you are pregnant, do not drink alcohol.  If you are breastfeeding, limit how much and how often you drink alcohol.  Limit alcohol intake to no more than 1 drink per day for nonpregnant women. One drink equals 12 ounces of beer, 5 ounces of wine, or 1 ounces of hard liquor.  Do not use street drugs.  Do not share needles.  Ask your health care provider for help if  you need support or information about quitting drugs.  Tell your health care provider if you often feel depressed.  Tell your health care provider if you have ever been abused or do not feel safe at home.   This information is not intended to replace advice given to you by your health care provider. Make sure you discuss any questions you have with your health care provider.   Document Released: 05/24/2011 Document Revised: 11/29/2014 Document Reviewed: 10/10/2013 Elsevier Interactive Patient Education [employer].

## 2016-05-05 ENCOUNTER — Encounter: Payer: Self-pay | Admitting: Internal Medicine

## 2016-05-05 LAB — HM DIABETES EYE EXAM

## 2016-05-19 ENCOUNTER — Encounter: Payer: Self-pay | Admitting: Internal Medicine

## 2016-05-28 ENCOUNTER — Encounter: Payer: Self-pay | Admitting: Internal Medicine

## 2016-05-28 ENCOUNTER — Ambulatory Visit (INDEPENDENT_AMBULATORY_CARE_PROVIDER_SITE_OTHER): Payer: Medicare Other

## 2016-05-28 ENCOUNTER — Ambulatory Visit (INDEPENDENT_AMBULATORY_CARE_PROVIDER_SITE_OTHER): Payer: Medicare Other | Admitting: Internal Medicine

## 2016-05-28 VITALS — BP 150/76 | HR 89 | Ht 62.0 in | Wt 149.2 lb

## 2016-05-28 DIAGNOSIS — M25531 Pain in right wrist: Secondary | ICD-10-CM

## 2016-05-28 DIAGNOSIS — R109 Unspecified abdominal pain: Secondary | ICD-10-CM | POA: Diagnosis not present

## 2016-05-28 DIAGNOSIS — L299 Pruritus, unspecified: Secondary | ICD-10-CM | POA: Diagnosis not present

## 2016-05-28 LAB — CBC WITH DIFFERENTIAL/PLATELET
Basophils Absolute: 0 10*3/uL (ref 0.0–0.1)
Basophils Relative: 0.5 % (ref 0.0–3.0)
EOS PCT: 0.6 % (ref 0.0–5.0)
Eosinophils Absolute: 0.1 10*3/uL (ref 0.0–0.7)
HCT: 45.5 % (ref 36.0–46.0)
HEMOGLOBIN: 15 g/dL (ref 12.0–15.0)
LYMPHS PCT: 33.2 % (ref 12.0–46.0)
Lymphs Abs: 2.8 10*3/uL (ref 0.7–4.0)
MCHC: 33 g/dL (ref 30.0–36.0)
MCV: 82.1 fl (ref 78.0–100.0)
MONO ABS: 0.6 10*3/uL (ref 0.1–1.0)
MONOS PCT: 7 % (ref 3.0–12.0)
Neutro Abs: 5 10*3/uL (ref 1.4–7.7)
Neutrophils Relative %: 58.7 % (ref 43.0–77.0)
Platelets: 277 10*3/uL (ref 150.0–400.0)
RBC: 5.54 Mil/uL — AB (ref 3.87–5.11)
RDW: 13.7 % (ref 11.5–15.5)
WBC: 8.6 10*3/uL (ref 4.0–10.5)

## 2016-05-28 LAB — COMPREHENSIVE METABOLIC PANEL
ALBUMIN: 4.6 g/dL (ref 3.5–5.2)
ALK PHOS: 80 U/L (ref 39–117)
ALT: 23 U/L (ref 0–35)
AST: 21 U/L (ref 0–37)
BUN: 20 mg/dL (ref 6–23)
CALCIUM: 10.6 mg/dL — AB (ref 8.4–10.5)
CO2: 31 mEq/L (ref 19–32)
Chloride: 99 mEq/L (ref 96–112)
Creatinine, Ser: 0.74 mg/dL (ref 0.40–1.20)
GFR: 82.21 mL/min (ref >60.00)
Glucose, Bld: 123 mg/dL — ABNORMAL HIGH (ref 70–99)
POTASSIUM: 5.3 meq/L — AB (ref 3.5–5.1)
Sodium: 138 mEq/L (ref 135–145)
TOTAL PROTEIN: 7.5 g/dL (ref 6.0–8.3)
Total Bilirubin: 0.5 mg/dL (ref 0.2–1.2)

## 2016-05-28 LAB — VITAMIN B12: VITAMIN B 12: 141 pg/mL — AB (ref 211–911)

## 2016-05-28 LAB — TSH: TSH: 0.61 u[IU]/mL (ref 0.35–4.50)

## 2016-05-28 LAB — MAGNESIUM: Magnesium: 1.8 mg/dL (ref 1.5–2.5)

## 2016-05-28 NOTE — Patient Instructions (Signed)
Labs today

## 2016-05-28 NOTE — Assessment & Plan Note (Signed)
Unclear cause for diffuse itching and pain. See note below. Will send labs. Recommended allergy testing which she declines.

## 2016-05-28 NOTE — Assessment & Plan Note (Signed)
Unclear cause of abdominal cramping and diffuse itching and pain. Discussed some potential causes including viral infection, food allergy. I also think her symptoms may be related to anxiety. Will check labs today. Encouraged her to consider testing for food allergy. She declines.

## 2016-05-28 NOTE — Progress Notes (Signed)
Subjective:    Patient ID: Anita Mercer, female    DOB: 1946/03/26, 70 y.o.   MRN: [id number]  HPI  70YO female presents for follow up.  Sudden onset of diffuse itching Friday June 30th at 8pm. No change in meds. No shortness of breath. Diffuse pain. Similar to previous reaction to Prilosec. No new foods. No other clear triggers. No vomiting or diarrhea. Some abdominal cramping. (see scanned documentation with patient's handwritten account of symptoms )  Right wrist pain - Aching pain medial wrist several months. Worsened with activity. Not taking any medication for this. No known injury. No weakness noted.   Wt Readings from Last 3 Encounters:  05/28/16 149 lb 3.2 oz (67.677 kg)  05/03/16 152 lb 12.8 oz (69.31 kg)  03/15/16 150 lb 12.8 oz (68.402 kg)   BP Readings from Last 3 Encounters:  05/28/16 150/76  05/03/16 140/68  03/15/16 158/72    Past Medical History  Diagnosis Date  . RMSF [facility] spotted fever)   . Diabetes mellitus 2000  . Hypertension   . Hyperlipidemia   . Neuromuscular disorder (HCC)     neuropathy   Family History  Problem Relation Age of Onset  . Adopted: Yes   Past Surgical History  Procedure Laterality Date  . Cholecystectomy    . Abdominal hysterectomy    . Foot surgery      Dr. Paulla Dolly, bone spurs  . Cataract extraction Bilateral 10/18/11  04/04/13  . Colonoscopy with propofol N/A 08/11/2015    Procedure: COLONOSCOPY WITH PROPOFOL;  Surgeon: Lollie Sails, MD;  Location: [facility] ENDOSCOPY;  Service: Endoscopy;  Laterality: N/A;  . Esophagogastroduodenoscopy (egd) with propofol N/A 08/11/2015    Procedure: ESOPHAGOGASTRODUODENOSCOPY (EGD) WITH PROPOFOL;  Surgeon: Lollie Sails, MD;  Location[facility] ENDOSCOPY;  Service: Endoscopy;  Laterality: N/A;   Social History   Social History  . Marital Status: Married    Spouse Name: N/A  . Number of Children: 1  . Years of Education: N/A   Occupational History  . 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 Smoking status: Never Smoker   . Smokeless tobacco: Never Used  . Alcohol Use: No  . Drug Use: No  . Sexual Activity: Not Asked   Other Topics Concern  . None   Social History Narrative   Lives in [place] with husband. Moved from [place]. Orig from [place]. Daughter lives in [place]. Dogs at home. Engineer, mining at [employer]. Hobbies - stained glass, woodcarving.      Diet: regular, limited carbs. Daily Caffeine Use:  Coffee 2-3   Regular Exercise -  Daily, yardwork    Review of Systems  Constitutional: Negative for fever, chills, appetite change, fatigue and unexpected weight change.  Eyes: Negative for visual disturbance.  Respiratory: Negative for shortness of breath.   Cardiovascular: Negative for chest pain and leg swelling.  Gastrointestinal: Negative for nausea, vomiting, abdominal pain, diarrhea, constipation and blood in stool.  Genitourinary: Negative for dysuria and frequency.  Musculoskeletal: Positive for myalgias and arthralgias.  Skin: Negative for color change and rash.  Neurological: Negative for weakness.  Hematological: Negative for adenopathy. Does not bruise/bleed easily.  Psychiatric/Behavioral: Negative for dysphoric mood. The patient is not nervous/anxious.        Objective:    BP 150/76 mmHg  Pulse 89  Ht 5\' 2"  (1.575 m)  Wt 149 lb 3.2 oz (67.677 kg)  BMI 27.28 kg/m2  SpO2 98% Physical Exam  Constitutional: She is oriented to person, place, and time. She appears well-developed and well-nourished. No distress.  HENT:  Head: Normocephalic and atraumatic.  Right Ear: External ear normal.  Left Ear: External ear normal.  Nose: Nose normal.  Mouth/Throat: Oropharynx is clear and moist. No oropharyngeal exudate.  Eyes: Conjunctivae are normal. Pupils are equal, round, and reactive to light. Right eye exhibits no discharge. Left eye exhibits no discharge. No scleral icterus.    Neck: Normal range of motion. Neck supple. No tracheal deviation present. No thyromegaly present.  Cardiovascular: Normal rate, regular rhythm, normal heart sounds and intact distal pulses.  Exam reveals no gallop and no friction rub.   No murmur heard. Pulmonary/Chest: Effort normal and breath sounds normal. No respiratory distress. She has no wheezes. She has no rales. She exhibits no tenderness.  Abdominal: Soft. Bowel sounds are normal. She exhibits no distension. There is no tenderness. There is no rebound and no guarding.  Musculoskeletal: Normal range of motion. She exhibits no edema.       Right wrist: She exhibits tenderness and bony tenderness. She exhibits normal range of motion and no swelling.  Lymphadenopathy:    She has no cervical adenopathy.  Neurological: She is alert and oriented to person, place, and time. No cranial nerve deficit. She exhibits normal muscle tone. Coordination normal.  Skin: Skin is warm and dry. No rash noted. She is not diaphoretic. No erythema. No pallor.  Psychiatric: She has a normal mood and affect. Her behavior is normal. Judgment and thought content normal.          Assessment & Plan:   Problem List Items Addressed This Visit      Unprioritized   Abdominal cramping    Unclear cause of abdominal cramping and diffuse itching and pain. Discussed some potential causes including viral infection, food allergy. I also think her symptoms may be related to anxiety. Will check labs today. Encouraged her to consider testing for food allergy. She declines.      Itching - Primary    Unclear cause for diffuse itching and pain. See note below. Will send labs. Recommended allergy testing which she declines.       Relevant Orders   TSH   Comprehensive metabolic panel   CBC with Differential/Platelet   B12   Magnesium   Right wrist pain    Right medial wrist pain with tenderness on exam. Will get plain xray.       Relevant Orders   DG Wrist  Complete Right       No Follow-up on file.  Ronette Deter, MD Internal Medicine [facility]

## 2016-05-28 NOTE — Assessment & Plan Note (Signed)
Right medial wrist pain with tenderness on exam. Will get plain xray.

## 2016-05-28 NOTE — Progress Notes (Signed)
Pre visit review using our clinic review tool, if applicable. No additional management support is needed unless otherwise documented below in the visit note. 

## 2016-05-29 ENCOUNTER — Encounter: Payer: Self-pay | Admitting: Internal Medicine

## 2016-05-29 DIAGNOSIS — M25531 Pain in right wrist: Secondary | ICD-10-CM

## 2016-05-30 ENCOUNTER — Encounter: Payer: Self-pay | Admitting: Internal Medicine

## 2016-05-31 ENCOUNTER — Telehealth: Payer: Self-pay

## 2016-05-31 ENCOUNTER — Other Ambulatory Visit (INDEPENDENT_AMBULATORY_CARE_PROVIDER_SITE_OTHER): Payer: Medicare Other

## 2016-05-31 DIAGNOSIS — E875 Hyperkalemia: Secondary | ICD-10-CM

## 2016-05-31 LAB — BASIC METABOLIC PANEL
BUN: 20 mg/dL (ref 6–23)
CHLORIDE: 98 meq/L (ref 96–112)
CO2: 28 meq/L (ref 19–32)
CREATININE: 0.81 mg/dL (ref 0.40–1.20)
Calcium: 10.3 mg/dL (ref 8.4–10.5)
GFR: 74.07 mL/min (ref >60.00)
GLUCOSE: 188 mg/dL — AB (ref 70–99)
Potassium: 4.7 mEq/L (ref 3.5–5.1)
Sodium: 137 mEq/L (ref 135–145)

## 2016-05-31 NOTE — Telephone Encounter (Signed)
Ordered per lab results Dr. Gilford Rile.Marland Kitchen

## 2016-06-01 ENCOUNTER — Ambulatory Visit (INDEPENDENT_AMBULATORY_CARE_PROVIDER_SITE_OTHER): Payer: Medicare Other

## 2016-06-01 DIAGNOSIS — E538 Deficiency of other specified B group vitamins: Secondary | ICD-10-CM

## 2016-06-01 MED ORDER — CYANOCOBALAMIN 1000 MCG/ML IJ SOLN
1000.0000 ug | Freq: Once | INTRAMUSCULAR | Status: AC
  Administered 2016-06-01: 1000 ug via INTRAMUSCULAR

## 2016-06-01 NOTE — Progress Notes (Signed)
Patient is in today receiving a B12 injection in the left arm. Patient tolerated well.

## 2016-06-08 ENCOUNTER — Ambulatory Visit (INDEPENDENT_AMBULATORY_CARE_PROVIDER_SITE_OTHER): Payer: Medicare Other

## 2016-06-08 DIAGNOSIS — E538 Deficiency of other specified B group vitamins: Secondary | ICD-10-CM | POA: Diagnosis not present

## 2016-06-08 MED ORDER — CYANOCOBALAMIN 1000 MCG/ML IJ SOLN
1000.0000 ug | Freq: Once | INTRAMUSCULAR | Status: AC
  Administered 2016-06-08: 1000 ug via INTRAMUSCULAR

## 2016-06-08 NOTE — Progress Notes (Signed)
Patient was in today receiving a B12 injection in the right deltoid. Patient tolerated well.

## 2016-06-10 ENCOUNTER — Telehealth: Payer: Self-pay

## 2016-06-10 NOTE — Telephone Encounter (Signed)
AWV appointment scheduled after upcoming follow up with PCP on 06/16/16.

## 2016-06-10 NOTE — Telephone Encounter (Signed)
Patient is on the list for [employer] 2017 and may be a good candidate for an AWV in 2017. Please let me know if/when appt is scheduled.   

## 2016-06-14 ENCOUNTER — Ambulatory Visit: Payer: Medicare Other | Admitting: Internal Medicine

## 2016-06-15 ENCOUNTER — Ambulatory Visit (INDEPENDENT_AMBULATORY_CARE_PROVIDER_SITE_OTHER): Payer: Medicare Other

## 2016-06-15 DIAGNOSIS — E538 Deficiency of other specified B group vitamins: Secondary | ICD-10-CM | POA: Diagnosis not present

## 2016-06-15 MED ORDER — CYANOCOBALAMIN 1000 MCG/ML IJ SOLN
1000.0000 ug | Freq: Once | INTRAMUSCULAR | Status: AC
  Administered 2016-06-15: 1000 ug via INTRAMUSCULAR

## 2016-06-15 NOTE — Progress Notes (Signed)
Patient was in today receiving a injection in the left deltoid. Patient tolerated well.

## 2016-06-16 ENCOUNTER — Encounter: Payer: Self-pay | Admitting: Internal Medicine

## 2016-06-16 ENCOUNTER — Ambulatory Visit: Payer: Medicare Other | Admitting: Family Medicine

## 2016-06-16 ENCOUNTER — Ambulatory Visit (INDEPENDENT_AMBULATORY_CARE_PROVIDER_SITE_OTHER): Payer: Medicare Other | Admitting: Internal Medicine

## 2016-06-16 VITALS — BP 158/70 | HR 100 | Ht 62.0 in | Wt 150.0 lb

## 2016-06-16 DIAGNOSIS — R5382 Chronic fatigue, unspecified: Secondary | ICD-10-CM

## 2016-06-16 DIAGNOSIS — E785 Hyperlipidemia, unspecified: Secondary | ICD-10-CM | POA: Diagnosis not present

## 2016-06-16 DIAGNOSIS — Z0000 Encounter for general adult medical examination without abnormal findings: Secondary | ICD-10-CM | POA: Diagnosis not present

## 2016-06-16 DIAGNOSIS — L299 Pruritus, unspecified: Secondary | ICD-10-CM

## 2016-06-16 DIAGNOSIS — E1140 Type 2 diabetes mellitus with diabetic neuropathy, unspecified: Secondary | ICD-10-CM

## 2016-06-16 DIAGNOSIS — I10 Essential (primary) hypertension: Secondary | ICD-10-CM

## 2016-06-16 DIAGNOSIS — K219 Gastro-esophageal reflux disease without esophagitis: Secondary | ICD-10-CM

## 2016-06-16 DIAGNOSIS — R7309 Other abnormal glucose: Secondary | ICD-10-CM

## 2016-06-16 DIAGNOSIS — Z794 Long term (current) use of insulin: Secondary | ICD-10-CM

## 2016-06-16 DIAGNOSIS — R739 Hyperglycemia, unspecified: Secondary | ICD-10-CM

## 2016-06-16 MED ORDER — TRAMADOL HCL 50 MG PO TABS: 50.0000 mg | ORAL_TABLET | Freq: Three times a day (TID) | ORAL | 2 refills | Status: DC | PRN

## 2016-06-16 NOTE — Progress Notes (Signed)
Subjective:   Anita Mercer is a 70 y.o. female who presents for Medicare Annual (Subsequent) preventive examination.  Review of Systems:  No ROS.  Medicare Wellness Visit. Cardiac Risk Factors include: advanced age (>73men, >57 women);diabetes mellitus;hypertension     Objective:     Vitals: BP (!) 158/70 (BP Location: Left Arm, Patient Position: Sitting, Cuff Size: Normal)   Pulse 100   Ht 5\' 2"  (1.575 m)   Wt 150 lb (68 kg)   SpO2 98%   BMI 27.44 kg/m   Body mass index is 27.44 kg/m.   Tobacco History  Smoking Status  . Never Smoker  Smokeless Tobacco  . Never Used     Counseling given: Not Answered   Past Medical History:  Diagnosis Date  . Diabetes mellitus 2000  . Hyperlipidemia   . Hypertension   . Neuromuscular disorder (HCC)    neuropathy  . RMSF [facility] spotted fever)    Past Surgical History:  Procedure Laterality Date  . ABDOMINAL HYSTERECTOMY    . CATARACT EXTRACTION Bilateral 10/18/11  04/04/13  . CHOLECYSTECTOMY    . COLONOSCOPY WITH PROPOFOL N/A 08/11/2015   Procedure: COLONOSCOPY WITH PROPOFOL;  Surgeon: Lollie Sails, MD;  Location: [facility] ENDOSCOPY;  Service: Endoscopy;  Laterality: N/A;  . ESOPHAGOGASTRODUODENOSCOPY (EGD) WITH PROPOFOL N/A 08/11/2015   Procedure: ESOPHAGOGASTRODUODENOSCOPY (EGD) WITH PROPOFOL;  Surgeon: Lollie Sails, MD;  Location: [facility] ENDOSCOPY;  Service: Endoscopy;  Laterality: N/A;  . FOOT SURGERY     Dr. Paulla Dolly, bone spurs   Family History  Problem Relation Age of Onset  . Adopted: Yes   History  Sexual Activity  . Sexual activity: No    Outpatient Encounter Prescriptions as of 06/16/2016  Medication Sig  . aspirin EC 81 MG tablet Take 81 mg by mouth daily.    . B-D UF III MINI PEN NEEDLES 31G X 5 MM MISC CHECK BLOOD SUGAR TWO TO THREE TIMES DAILY.  Marland Kitchen Blood Glucose Calibration (OT ULTRA/FASTTK CNTRL SOLN) SOLN 1 drop by In Vitro route once. Please dispense controls for patient to use at home with  One Touch Ultra machine. Dx code 250.0  . CRANBERRY PO Take by mouth.  . fluticasone (FLONASE) 50 MCG/ACT nasal spray Place 2 sprays into both nostrils daily.  Marland Kitchen glucose blood test strip Please dispense One Touch Ultra testing strips for patient to use with home device. Checking blood sugars 2-3 times a day. Dx code 250.0  . Insulin Glargine (LANTUS SOLOSTAR) 100 UNIT/ML Solostar Pen Inject 50 Units into the skin daily.  . insulin lispro (HUMALOG) 100 UNIT/ML injection Use only w/blood sugar greater than >250  . JANUVIA 100 MG tablet TAKE ONE TABLET BY MOUTH ONE TIME DAILY  . Lancets (ONETOUCH ULTRASOFT) lancets USE TO CHECK BLOOD SUGAR 2-3 TIMES PER DAY  . lisinopril (PRINIVIL,ZESTRIL) 10 MG tablet Take 1 tablet (10 mg total) by mouth daily.  Marland Kitchen loratadine (CLARITIN) 10 MG tablet Take 10 mg by mouth daily.  . metFORMIN (GLUCOPHAGE) 1000 MG tablet Take 1 tablet (1,000 mg total) by mouth 2 (two) times daily.  . Multiple Vitamin (MULTIVITAMIN) tablet Take 1 tablet by mouth daily. Vitamin D3  . OMEGA-3 KRILL OIL 300 MG CAPS Take by mouth daily.    . ranitidine (ZANTAC) 150 MG tablet Take 1 tablet (150 mg total) by mouth 2 (two) times daily.  . simvastatin (ZOCOR) 20 MG tablet Take 1 tablet (20 mg total) by mouth at bedtime.  . traMADol Veatrice Bourbon)  50 MG tablet Take 1-2 tablets (50-100 mg total) by mouth 3 (three) times daily as needed.  . [DISCONTINUED] traMADol (ULTRAM) 50 MG tablet Take 1-2 tablets (50-100 mg total) by mouth 3 (three) times daily as needed.   No facility-administered encounter medications on file as of 06/16/2016.     Activities of Daily Living In your present state of health, do you have any difficulty performing the following activities: 06/16/2016  Hearing? N  Vision? N  Difficulty concentrating or making decisions? N  Walking or climbing stairs? N  Dressing or bathing? N  Doing errands, shopping? N  Preparing Food and eating ? N  Using the Toilet? N  In the past six  months, have you accidently leaked urine? N  Do you have problems with loss of bowel control? N  Managing your Medications? N  Managing your Finances? N  Housekeeping or managing your Housekeeping? N  Some recent data might be hidden    Patient Care Team: Jackolyn Confer, MD as PCP - General (Internal Medicine) Jackolyn Confer, MD (Internal Medicine)    Assessment:    This is a routine wellness examination for Anita Mercer. The goal of the wellness visit is to assist the patient how to close the gaps in care and create a preventative care plan for the patient.   Osteoporosis risk reviewed. DEXA Scan ordered.  Medications reviewed; taking without issues or barriers.  Safety issues reviewed; smoke and carbon monoxide detectors in the home. Safety rails in the home. Firearms locked in a safe area the home. Wears seatbelts when driving or riding with others. No violence in the home.  No identified risk were noted; The patient was oriented x 3; appropriate in dress and manner and no objective failures at ADL's or IADL's.   Body mass index; discussed the importance of a healthy diet, water intake and exercise. Educational material provided.  TDAP vaccine postponed for follow up with insurance.  Patient Concerns: None at this time. Follow up with PCP as needed.  Exercise Activities and Dietary recommendations Current Exercise Habits: Home exercise routine, Type of exercise: walking, Time (Minutes): 20, Frequency (Times/Week): 5, Weekly Exercise (Minutes/Week): 100, Intensity: Mild  Goals    . Healthy Lifestyle          Stay hydrated and drink plenty of fluids. Low carb foods. Stay active and continue walking for exercise.  Increase as tolerated.      Fall Risk Fall Risk  06/16/2016 06/16/2016 05/28/2016 05/03/2016 08/28/2014  Falls in the past year? No No No No No   Depression Screen PHQ 2/9 Scores 06/16/2016 05/28/2016 05/03/2016 08/28/2014  PHQ - 2 Score 0 0 0 0      Cognitive Testing MMSE - Mini Mental State Exam 06/16/2016  Orientation to time 5  Orientation to Place 5  Registration 3  Attention/ Calculation 5  Recall 3  Language- name 2 objects 2  Language- repeat 1  Language- follow 3 step command 3  Language- read & follow direction 1  Write a sentence 1  Copy design 1  Total score 30    Immunization History  Administered Date(s) Administered  . Influenza Split 08/11/2012  . Influenza,inj,Quad PF,36+ Mos 08/07/2013, 08/28/2014  . Influenza-Unspecified 08/17/2015  . Pneumococcal Conjugate-13 08/28/2014  . Pneumococcal Polysaccharide-23 09/11/2005, 08/07/2013  . Td 09/11/1998  . Zoster 09/11/2008   Screening Tests Health Maintenance  Topic Date Due  . TETANUS/TDAP  09/11/2008  . DEXA SCAN  05/06/2010  . INFLUENZA VACCINE  06/22/2016  . MAMMOGRAM  07/24/2016  . HEMOGLOBIN A1C  09/14/2016  . FOOT EXAM  05/03/2017  . OPHTHALMOLOGY EXAM  05/05/2017  . COLONOSCOPY  08/10/2020  . ZOSTAVAX  Completed  . Hepatitis C Screening  Completed  . PNA vac Low Risk Adult  Completed      Plan:   End of life planning; Advance aging; Advanced directives discussed. Copy of current HCPOA/Living Will in the chart.  Return for labs.  DEXA Scan as directed.   During the course of the visit the patient was educated and counseled about the following appropriate screening and preventive services:   Vaccines to include Pneumoccal, Influenza, Hepatitis B, Td, Zostavax, HCV  Electrocardiogram  Cardiovascular Disease  Colorectal cancer screening  Bone density screening  Diabetes screening  Glaucoma screening  Mammography/PAP  Nutrition counseling   Patient Instructions (the written plan) was given to the patient.   Varney Biles, LPN  [phone]

## 2016-06-16 NOTE — Patient Instructions (Addendum)
  Anita Mercer , Thank you for taking time to come for your Medicare Wellness Visit. I appreciate your ongoing commitment to your health goals. Please review the following plan we discussed and let me know if I can assist you in the future.    This is a list of the screening recommended for you and due dates:  Health Maintenance  Topic Date Due  . Tetanus Vaccine  09/11/2008  . DEXA scan (bone density measurement)  05/06/2010  . Flu Shot  06/22/2016  . Mammogram  07/24/2016  . Hemoglobin A1C  09/14/2016  . Complete foot exam   05/03/2017  . Eye exam for diabetics  05/05/2017  . Colon Cancer Screening  08/10/2020  . Shingles Vaccine  Completed  .  Hepatitis C: One time screening is recommended by Center for Disease Control  (CDC) for  adults born from [birth year] through 1966.   Completed  . Pneumonia vaccines  Completed     We will set up referral to GI and new patient evaluation with Dr. Army Melia.    Bone Densitometry Bone densitometry is an imaging test that uses a special X-ray to measure the amount of calcium and other minerals in your bones (bone density). This test is also known as a bone mineral density test or dual-energy X-ray absorptiometry (DXA). The test can measure bone density at your hip and your spine. It is similar to having a regular X-ray. You may have this test to:  Diagnose a condition that causes weak or thin bones (osteoporosis).  Predict your risk of a broken bone (fracture).  Determine how well osteoporosis treatment is working. LET [facility] CARE PROVIDER KNOW ABOUT:  Any allergies you have.  All medicines you are taking, including vitamins, herbs, eye drops, creams, and over-the-counter medicines.  Previous problems you or members of your family have had with the use of anesthetics.  Any blood disorders you have.  Previous surgeries you have had.  Medical conditions you have.  Possibility of pregnancy.  Any other medical test you had within the  previous 14 days that used contrast material. RISKS AND COMPLICATIONS Generally, this is a safe procedure. However, problems can occur and may include the following:  This test exposes you to a very small amount of radiation.  The risks of radiation exposure may be greater to unborn children. BEFORE THE PROCEDURE  Do not take any calcium supplements for 24 hours before having the test. You can otherwise eat and drink what you usually do.  Take off all metal jewelry, eyeglasses, dental appliances, and any other metal objects. PROCEDURE  You may lie on an exam table. There will be an X-ray generator below you and an imaging device above you.  Other devices, such as boxes or braces, may be used to position your body properly for the scan.  You will need to lie still while the machine slowly scans your body.  The images will show up on a computer monitor. AFTER THE PROCEDURE You may need more testing at a later time.   This information is not intended to replace advice given to you by your health care provider. Make sure you discuss any questions you have with your health care provider.   Document Released: 11/30/2004 Document Revised: 11/29/2014 Document Reviewed: 04/18/2014 Elsevier Interactive Patient Education [employer].

## 2016-06-16 NOTE — Progress Notes (Signed)
Subjective:    Patient ID: Anita Mercer, female    DOB: 10-Jun-1946, 70 y.o.   MRN: [id number]  HPI  70YO female presents for follow up.  Recently seen for diffuse itching, abdominal pain, wrist pain.  Generally not feeling well. Worsening acid reflux. Belching and epigastric pain keeping her up at night. Taking Zantac with no improvement. Unable to tolerate PPIs as prescribed by Dr. Gustavo Lah. Does not want to follow up with Dr. Gustavo Lah because of issues she had with office. No persistent abdominal pain. No change in weight. No NV. No change in BMs.  Worsening neuropathic pain. Taking Tramadol at night but continues to have bilateral burning pain in her legs. Lack of energy. Feels exhausted. Unable to help husband with construction projects. Notes some recent mouth ulceration. Questions cause of this. Avoiding spicy foods.  See scanned 1 page list of typed concerns today.  Wt Readings from Last 3 Encounters:  06/16/16 150 lb (68 kg)  05/28/16 149 lb 3.2 oz (67.7 kg)  05/03/16 152 lb 12.8 oz (69.3 kg)   BP Readings from Last 3 Encounters:  06/16/16 (!) 158/70  05/28/16 (!) 150/76  05/03/16 140/68    Past Medical History:  Diagnosis Date  . Diabetes mellitus 2000  . Hyperlipidemia   . Hypertension   . Neuromuscular disorder (HCC)    neuropathy  . RMSF [facility] spotted fever)    Family History  Problem Relation Age of Onset  . Adopted: Yes   Past Surgical History:  Procedure Laterality Date  . ABDOMINAL HYSTERECTOMY    . CATARACT EXTRACTION Bilateral 10/18/11  04/04/13  . CHOLECYSTECTOMY    . COLONOSCOPY WITH PROPOFOL N/A 08/11/2015   Procedure: COLONOSCOPY WITH PROPOFOL;  Surgeon: Lollie Sails, MD;  Location[facility] ENDOSCOPY;  Service: Endoscopy;  Laterality: N/A;  . ESOPHAGOGASTRODUODENOSCOPY (EGD) WITH PROPOFOL N/A 08/11/2015   Procedure: ESOPHAGOGASTRODUODENOSCOPY (EGD) WITH PROPOFOL;  Surgeon: Lollie Sails, MD;  Location: [facility] ENDOSCOPY;  Service:  Endoscopy;  Laterality: N/A;  . FOOT SURGERY     Dr. Paulla Dolly, bone spurs   Social History   Social History  . Marital status: Married    Spouse name: N/A  . Number of children: 1  . Years of education: N/A   Occupational History  . Retired - Engineer, mining     31 yrs [place] History Main Topics  . Smoking status: Never Smoker  . Smokeless tobacco: Never Used  . Alcohol use No  . Drug use: No  . Sexual activity: No   Other Topics Concern  . None   Social History Narrative   Lives in [place] with husband. Moved from [place]. Orig from [place]. Daughter lives in [place]. Dogs at home. Engineer, mining at [employer]. Hobbies - stained glass, woodcarving.      Diet: regular, limited carbs. Daily Caffeine Use:  Coffee 2-3   Regular Exercise -  Daily, yardwork    Review of Systems  Constitutional: Positive for activity change and fatigue. Negative for appetite change, chills, fever and unexpected weight change.  HENT: Positive for mouth sores. Negative for congestion, dental problem, ear pain, hearing loss, nosebleeds, postnasal drip, rhinorrhea, sore throat, trouble swallowing and voice change.   Eyes: Negative for visual disturbance.  Respiratory: Negative for cough, chest tightness, shortness of breath and wheezing.   Cardiovascular: Negative for chest pain, palpitations and leg swelling.  Gastrointestinal: Positive for abdominal pain. Negative for blood in stool, constipation, diarrhea, nausea and  vomiting.  Genitourinary: Negative for dysuria.  Musculoskeletal: Positive for arthralgias and myalgias.  Skin: Negative for color change and rash.  Hematological: Negative for adenopathy. Does not bruise/bleed easily.  Psychiatric/Behavioral: Positive for sleep disturbance. Negative for dysphoric mood. The patient is nervous/anxious.        Objective:    BP (!) 158/70 (BP Location: Left Arm, Patient Position: Sitting, Cuff Size: Normal)   Pulse 100   Ht  5' 2"  (1.575 m)   Wt 150 lb (68 kg)   SpO2 98%   BMI 27.44 kg/m  Physical Exam  Constitutional: She is oriented to person, place, and time. She appears well-developed and well-nourished. No distress.  HENT:  Head: Normocephalic and atraumatic.  Right Ear: External ear normal.  Left Ear: External ear normal.  Nose: Nose normal.  Mouth/Throat: Oropharynx is clear and moist. No oropharyngeal exudate.  Eyes: Conjunctivae are normal. Pupils are equal, round, and reactive to light. Right eye exhibits no discharge. Left eye exhibits no discharge. No scleral icterus.  Neck: Normal range of motion. Neck supple. No tracheal deviation present. No thyromegaly present.  Cardiovascular: Normal rate, regular rhythm, normal heart sounds and intact distal pulses.  Exam reveals no gallop and no friction rub.   No murmur heard. Pulmonary/Chest: Effort normal and breath sounds normal. No respiratory distress. She has no wheezes. She has no rales. She exhibits no tenderness.  Musculoskeletal: Normal range of motion. She exhibits no edema or tenderness.  Lymphadenopathy:    She has no cervical adenopathy.  Neurological: She is alert and oriented to person, place, and time. No cranial nerve deficit. She exhibits normal muscle tone. Coordination normal.  Skin: Skin is warm and dry. No rash noted. She is not diaphoretic. No erythema. No pallor.  Psychiatric: Her speech is normal and behavior is normal. Judgment and thought content normal. Her mood appears anxious. Cognition and memory are normal.          Assessment & Plan:   Problem List Items Addressed This Visit      Unprioritized   Chronic fatigue    Recent fatigue, malaise, arthralgia, myalgia. Labs were normal except for low B12 which has been supplemented. Suspect anxiety playing a role. Encouraged follow up GI evaluation for reflux. Will continue current medications for now. Follow up with new PCP in 4 weeks.  Over 33mn of which >50% spent in  face-to-face contact with patient and her husband discussing plan of care       Diabetes mellitus type 2, controlled ([place] (Chronic)    Will check A1c with labs today.      GERD (gastroesophageal reflux disease) - Primary (Chronic)    Worsening reflux symptoms. Unable to tolerate PPI. On Zantac alone. Recommended evaluation with GI for possible repeat upper endoscopy.      Relevant Orders   Ambulatory referral to Gastroenterology   Hyperlipidemia   Relevant Orders   Lipid Profile   Hypertension (Chronic)    BP Readings from Last 3 Encounters:  06/16/16 (!) 158/70  05/28/16 (!) 150/76  05/03/16 140/68   BP slightly elevated today, however she is upset. Renal function with labs today. Continue current medication. Follow up with new PCP in 4 weeks.      RESOLVED: Itching   Relevant Orders   Comp Met (CMET)    Other Visit Diagnoses    Medicare annual wellness, subsequent       Relevant Orders   DEXAScan   Elevated blood sugar  Relevant Orders   HgB A1c       Return in about 4 weeks (around 07/14/2016) for New Patient.  Ronette Deter, MD Internal Medicine [facility]

## 2016-06-16 NOTE — Assessment & Plan Note (Signed)
Worsening reflux symptoms. Unable to tolerate PPI. On Zantac alone. Recommended evaluation with GI for possible repeat upper endoscopy.

## 2016-06-16 NOTE — Progress Notes (Signed)
Pre visit review using our clinic review tool, if applicable. No additional management support is needed unless otherwise documented below in the visit note. 

## 2016-06-16 NOTE — Progress Notes (Signed)
Annual Wellness Visit as completed by Health Coach was reviewed in full.  

## 2016-06-16 NOTE — Progress Notes (Signed)
[name] [place], [place] [address] Phone: [phone] Subjective:    I'm seeing this patient by the request  of:    CC: Right wrist pain  [id number]  Anita Mercer is a 70 y.o. female coming in with complaint of right wrist pain. Has been going on for several months. Worse with activity. Seems to be on the medial aspect. More over the thumb. Patient states that she was doing a lot of standing and they have exacerbated the problem. Does not remember any true injury. Patient states that there has been some swelling. States that certain movements has a sharp pain that is a severity of 8 out of 10. Denies any numbness. States though that the pain can even wake her up at night.    Previous imaging of her right wrist on 05/28/2016 show very mild osteophytic changes.  Past Medical History:  Diagnosis Date  . Diabetes mellitus 2000  . Hyperlipidemia   . Hypertension   . Neuromuscular disorder (HCC)    neuropathy  . RMSF [facility] spotted fever)    Past Surgical History:  Procedure Laterality Date  . ABDOMINAL HYSTERECTOMY    . CATARACT EXTRACTION Bilateral 10/18/11  04/04/13  . CHOLECYSTECTOMY    . COLONOSCOPY WITH PROPOFOL N/A 08/11/2015   Procedure: COLONOSCOPY WITH PROPOFOL;  Surgeon: Lollie Sails, MD;  Location: [facility] ENDOSCOPY;  Service: Endoscopy;  Laterality: N/A;  . ESOPHAGOGASTRODUODENOSCOPY (EGD) WITH PROPOFOL N/A 08/11/2015   Procedure: ESOPHAGOGASTRODUODENOSCOPY (EGD) WITH PROPOFOL;  Surgeon: Lollie Sails, MD;  Location: [facility] ENDOSCOPY;  Service: Endoscopy;  Laterality: N/A;  . FOOT SURGERY     Dr. Paulla Dolly, bone spurs   Social History   Social History  . Marital status: Married    Spouse name: N/A  . Number of children: 1  . Years of education: N/A   Occupational History  . 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 Smoking status: Never Smoker  . Smokeless  tobacco: Never Used  . Alcohol use No  . Drug use: No  . Sexual activity: No   Other Topics Concern  . None   Social History Narrative   Lives in [place] with husband. Moved from [place]. Orig from [place]. Daughter lives in [place]. Dogs at home. Engineer, mining at [employer]. Hobbies - stained glass, woodcarving.      Diet: regular, limited carbs. Daily Caffeine Use:  Coffee 2-3   Regular Exercise -  Daily, yardwork   Allergies  Allergen Reactions  . Ciprofloxacin     Worsening symptoms of neuropathy  . Omeprazole Itching, Swelling and Other (See Comments)  . Proton Pump Inhibitors     Other reaction(s): Angioedema   Family History  Problem Relation Age of Onset  . Adopted: Yes    Past medical history, social, surgical and family history all reviewed in electronic medical record.  No pertanent information unless stated regarding to the chief complaint.   Review of Systems: No headache, visual changes, nausea, vomiting, diarrhea, constipation, dizziness, abdominal pain, skin rash, fevers, chills, night sweats, weight loss, swollen lymph nodes, body aches, joint swelling, muscle aches, chest pain, shortness of breath, mood changes.   Objective  Blood pressure (!) 152/82, pulse 72, weight 150 lb (68 kg).  General: No apparent distress alert and oriented x3 mood and affect normal, dressed appropriately.  HEENT: Pupils equal, extraocular movements intact  Respiratory:  Patient's speak in full sentences and does not appear short of breath  Cardiovascular: No lower extremity edema, non tender, no erythema  Skin: Warm dry intact with no signs of infection or rash on extremities or on axial skeleton.  Abdomen: Soft nontender  Neuro: Cranial nerves II through XII are intact, neurovascularly intact in all extremities with 2+ DTRs and 2+ pulses.  Lymph: No lymphadenopathy of posterior or anterior cervical chain or axillae bilaterally.  Gait normal with good balance and  coordination.  MSK:  Non tender with full range of motion and good stability and symmetric strength and tone of shoulders, elbows, hip, knee and ankles bilaterally.  Wrist:Right Inspection normal with no visible erythema or swelling. ROM smooth and normal with good flexion and extension and ulnar/radial deviation that is symmetrical with opposite wrist. Palpation is normal over metacarpals, navicular, lunate, and TFCC; tendons without tenderness/ swelling No snuffbox tenderness. No tenderness over Canal of Guyon. Strength 5/5 in all directions without pain. Positive Finkelstein, negative  tinel's and phalens. Negative Watson's test. Contralateral wrist unremarkable  MSK US performed of: Right This study was ordered, performed, and interpreted by Charlann Boxer D.O.  Wrist:Right  All extensor compartments visualized and tendons all last the patient does have significant swelling in the abductor pollicis longus tendon sheath TFCC intact. Scapholunate ligament intact. Carpal tunnel visualized and median nerve area normal, flexor tendons all normal in appearance without fraying, tears, or sheath effusions.   IMPRESSION: De Quervain's tenosynovitis    [id number]; 15 minutes spent for Therapeutic exercises as stated in above notes.  This included exercises focusing on stretching, strengthening, with significant focus on eccentric aspects.  Wrist exercises including abduction as well as opposition with eccentric exercises and range of motion. Proper technique shown and discussed handout in great detail with ATC.  All questions were discussed and answered.      Impression and Recommendations:     This case required medical decision making of moderate complexity.      Note: This dictation was prepared with Dragon dictation along with smaller phrase technology. Any transcriptional errors that result from this process are unintentional.

## 2016-06-16 NOTE — Assessment & Plan Note (Signed)
Recent fatigue, malaise, arthralgia, myalgia. Labs were normal except for low B12 which has been supplemented. Suspect anxiety playing a role. Encouraged follow up GI evaluation for reflux. Will continue current medications for now. Follow up with new PCP in 4 weeks.  Over 70min of which >50% spent in face-to-face contact with patient and her husband discussing plan of care

## 2016-06-16 NOTE — Assessment & Plan Note (Signed)
BP Readings from Last 3 Encounters:  06/16/16 (!) 158/70  05/28/16 (!) 150/76  05/03/16 140/68   BP slightly elevated today, however she is upset. Renal function with labs today. Continue current medication. Follow up with new PCP in 4 weeks.

## 2016-06-16 NOTE — Assessment & Plan Note (Signed)
Will check A1c with labs today. 

## 2016-06-17 ENCOUNTER — Ambulatory Visit (INDEPENDENT_AMBULATORY_CARE_PROVIDER_SITE_OTHER): Payer: Medicare Other | Admitting: Family Medicine

## 2016-06-17 ENCOUNTER — Encounter: Payer: Self-pay | Admitting: Family Medicine

## 2016-06-17 ENCOUNTER — Other Ambulatory Visit: Payer: Self-pay

## 2016-06-17 ENCOUNTER — Other Ambulatory Visit (INDEPENDENT_AMBULATORY_CARE_PROVIDER_SITE_OTHER): Payer: Medicare Other

## 2016-06-17 VITALS — BP 152/82 | HR 72 | Wt 150.0 lb

## 2016-06-17 DIAGNOSIS — L299 Pruritus, unspecified: Secondary | ICD-10-CM | POA: Diagnosis not present

## 2016-06-17 DIAGNOSIS — M25531 Pain in right wrist: Secondary | ICD-10-CM

## 2016-06-17 DIAGNOSIS — R739 Hyperglycemia, unspecified: Secondary | ICD-10-CM

## 2016-06-17 DIAGNOSIS — R7309 Other abnormal glucose: Secondary | ICD-10-CM

## 2016-06-17 DIAGNOSIS — E785 Hyperlipidemia, unspecified: Secondary | ICD-10-CM | POA: Diagnosis not present

## 2016-06-17 DIAGNOSIS — M654 Radial styloid tenosynovitis [de Quervain]: Secondary | ICD-10-CM

## 2016-06-17 LAB — HEMOGLOBIN A1C: Hgb A1c MFr Bld: 7.9 % — ABNORMAL HIGH (ref 4.6–6.5)

## 2016-06-17 LAB — LIPID PANEL
CHOL/HDL RATIO: 4
CHOLESTEROL: 134 mg/dL (ref 0–200)
HDL: 34.8 mg/dL — ABNORMAL LOW (ref >39.00)
LDL CALC: 60 mg/dL (ref 0–99)
NONHDL: 99.16
Triglycerides: 194 mg/dL — ABNORMAL HIGH (ref 0.0–149.0)
VLDL: 38.8 mg/dL (ref 0.0–40.0)

## 2016-06-17 LAB — COMPREHENSIVE METABOLIC PANEL
ALBUMIN: 4.3 g/dL (ref 3.5–5.2)
ALT: 24 U/L (ref 0–35)
AST: 23 U/L (ref 0–37)
Alkaline Phosphatase: 81 U/L (ref 39–117)
BILIRUBIN TOTAL: 0.3 mg/dL (ref 0.2–1.2)
BUN: 16 mg/dL (ref 6–23)
CHLORIDE: 103 meq/L (ref 96–112)
CO2: 30 mEq/L (ref 19–32)
CREATININE: 0.74 mg/dL (ref 0.40–1.20)
Calcium: 9.7 mg/dL (ref 8.4–10.5)
GFR: 82.2 mL/min (ref >60.00)
Glucose, Bld: 105 mg/dL — ABNORMAL HIGH (ref 70–99)
Potassium: 4.4 mEq/L (ref 3.5–5.1)
SODIUM: 139 meq/L (ref 135–145)
Total Protein: 7.6 g/dL (ref 6.0–8.3)

## 2016-06-17 NOTE — Assessment & Plan Note (Signed)
Home exercise program given, patient will be in the immobilizer thumb spica for short course. Topical anti-inflammatories and icing protocol. Patient will follow-up with me again in 3 weeks. If worsening symptoms we'll consider injection as well as referral to formal physical therapy.

## 2016-06-17 NOTE — Patient Instructions (Signed)
God to see you  Ice 20 minutes 2 times daily. Usually after activity and before bed. pennsaid pinkie amount topically 2 times daily as needed.  Wear brace day and night for 1 week then nightly for 2 weeks.  Avoid repetitive motion if you can  See me again in 3 weeks and if not better we may need to consider injection.

## 2016-06-18 ENCOUNTER — Other Ambulatory Visit: Payer: Self-pay | Admitting: Internal Medicine

## 2016-06-18 ENCOUNTER — Encounter: Payer: Self-pay | Admitting: Internal Medicine

## 2016-06-18 DIAGNOSIS — L989 Disorder of the skin and subcutaneous tissue, unspecified: Secondary | ICD-10-CM

## 2016-06-23 ENCOUNTER — Encounter: Payer: Self-pay | Admitting: Internal Medicine

## 2016-07-05 ENCOUNTER — Telehealth: Payer: Self-pay | Admitting: Internal Medicine

## 2016-07-05 NOTE — Telephone Encounter (Signed)
Patient is wanting to transfer care from [facility] states she was referred to Dr.Pyrtle for a repeat Endoscopy from PCP and would like to go where PCP has referred her. Records have been place on Dr.Pyrtle's desk for review.

## 2016-07-08 ENCOUNTER — Ambulatory Visit
Admission: RE | Admit: 2016-07-08 | Discharge: 2016-07-08 | Disposition: A | Discharge status: Home / Self Care | Payer: Medicare Other | Source: Ambulatory Visit | Attending: Internal Medicine | Admitting: Internal Medicine

## 2016-07-08 DIAGNOSIS — M8580 Other specified disorders of bone density and structure, unspecified site: Secondary | ICD-10-CM | POA: Insufficient documentation

## 2016-07-08 DIAGNOSIS — Z13820 Encounter for screening for osteoporosis: Secondary | ICD-10-CM | POA: Diagnosis not present

## 2016-07-08 DIAGNOSIS — Z0000 Encounter for general adult medical examination without abnormal findings: Secondary | ICD-10-CM

## 2016-07-09 ENCOUNTER — Encounter: Payer: Self-pay | Admitting: Internal Medicine

## 2016-07-09 DIAGNOSIS — M8580 Other specified disorders of bone density and structure, unspecified site: Secondary | ICD-10-CM | POA: Insufficient documentation

## 2016-07-10 NOTE — Progress Notes (Signed)
[facility] [place], [place] [address] Phone: [phone] Subjective:    I'm seeing this patient by the request  of:    CC: Right wrist pain f/u   [id number]  Anita Mercer is a 70 y.o. female coming in with complaint of right wrist pain. Had DeQuervain, elected to try HEP, bracing, topical NSAIDs.  Patient states No significant improvement. States that it continues to have pain. Repetitive motion seems to be still worsening. Denies any numbness or tingling. States though that there may be some mild weakness.    Previous imaging of her right wrist on 05/28/2016 show very mild osteophytic changes.  Past Medical History:  Diagnosis Date  . Diabetes mellitus 2000  . Hyperlipidemia   . Hypertension   . Neuromuscular disorder (HCC)    neuropathy  . RMSF [facility] spotted fever)    Past Surgical History:  Procedure Laterality Date  . ABDOMINAL HYSTERECTOMY    . CATARACT EXTRACTION Bilateral 10/18/11  04/04/13  . CHOLECYSTECTOMY    . COLONOSCOPY WITH PROPOFOL N/A 08/11/2015   Procedure: COLONOSCOPY WITH PROPOFOL;  Surgeon: Lollie Sails, MD;  Location: [facility] ENDOSCOPY;  Service: Endoscopy;  Laterality: N/A;  . ESOPHAGOGASTRODUODENOSCOPY (EGD) WITH PROPOFOL N/A 08/11/2015   Procedure: ESOPHAGOGASTRODUODENOSCOPY (EGD) WITH PROPOFOL;  Surgeon: Lollie Sails, MD;  Location: [facility] ENDOSCOPY;  Service: Endoscopy;  Laterality: N/A;  . FOOT SURGERY     Dr. Paulla Dolly, bone spurs   Social History   Social History  . Marital status: Married    Spouse name: N/A  . Number of children: 1  . Years of education: N/A   Occupational History  . 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 Smoking status: Never Smoker  . Smokeless tobacco: Never Used  . Alcohol use No  . Drug use: No  . Sexual activity: No   Other Topics Concern  . None   Social History Narrative   Lives in [place] with  husband. Moved from [place]. Orig from [place]. Daughter lives in [place]. Dogs at home. Engineer, mining at [employer]. Hobbies - stained glass, woodcarving.      Diet: regular, limited carbs. Daily Caffeine Use:  Coffee 2-3   Regular Exercise -  Daily, yardwork   Allergies  Allergen Reactions  . Ciprofloxacin     Worsening symptoms of neuropathy  . Omeprazole Itching, Swelling and Other (See Comments)  . Proton Pump Inhibitors     Other reaction(s): Angioedema   Family History  Problem Relation Age of Onset  . Adopted: Yes    Past medical history, social, surgical and family history all reviewed in electronic medical record.  No pertanent information unless stated regarding to the chief complaint.   Review of Systems: No headache, visual changes, nausea, vomiting, diarrhea, constipation, dizziness, abdominal pain, skin rash, fevers, chills, night sweats, weight loss, swollen lymph nodes, body aches, joint swelling, muscle aches, chest pain, shortness of breath, mood changes.   Objective  Blood pressure 128/74, pulse 81, weight 152 lb (68.9 kg), SpO2 96 %.  General: No apparent distress alert and oriented x3 mood and affect normal, dressed appropriately.  HEENT: Pupils equal, extraocular movements intact  Respiratory: Patient's speak in full sentences and does not appear short of breath  Cardiovascular: No lower extremity edema, non tender, no erythema  Skin: Warm dry intact with no signs of infection or rash on extremities  or on axial skeleton.  Abdomen: Soft nontender  Neuro: Cranial nerves II through XII are intact, neurovascularly intact in all extremities with 2+ DTRs and 2+ pulses.  Lymph: No lymphadenopathy of posterior or anterior cervical chain or axillae bilaterally.  Gait normal with good balance and coordination.  MSK:  Non tender with full range of motion and good stability and symmetric strength and tone of shoulders, elbows, hip, knee and ankles bilaterally.    Wrist:Right Inspection normal with no visible erythema or swelling. ROM smooth and normal with good flexion and extension and ulnar/radial deviation that is symmetrical with opposite wrist. Palpation is normal over metacarpals, navicular, lunate, and TFCC; tendons without tenderness/ swelling No snuffbox tenderness. No tenderness over Canal of Guyon. Strength 5/5 in all directions without pain. Positive Finkelstein, negative  tinel's and phalens. Negative Watson's test. Contralateral wrist unremarkable No change from previous exam    Procedure: Real-time Ultrasound Guided Injection of right Abductor pollicis longs tendon sheath Device: GE Logiq E  Ultrasound guided injection is preferred based studies that show increased duration, increased effect, greater accuracy, decreased procedural pain, increased response rate with ultrasound guided versus blind injection.  Verbal informed consent obtained.  Time-out conducted.  Noted no overlying erythema, induration, or other signs of local infection.  Skin prepped in a sterile fashion.  Local anesthesia: Topical Ethyl chloride.  With sterile technique and under real time ultrasound guidance:  tendon visualized.  23g 5/8 inch needle inserted distal to proximal approach into tendon sheath. Pictures taken  for needle placement. Patient did have injection of 2 cc of 1% lidocaine, 1 cc of 0.5% Marcaine, and 0.5 cc of Kenalog 40 mg/dL. Completed without difficulty  Pain immediately resolved suggesting accurate placement of the medication.  Advised to call if fevers/chills, erythema, induration, drainage, or persistent bleeding.  Images permanently stored and available for review in the ultrasound unit.  Impression: Technically successful ultrasound guided injection.       Impression and Recommendations:     This case required medical decision making of moderate complexity.      Note: This dictation was prepared with Dragon dictation  along with smaller phrase technology. Any transcriptional errors that result from this process are unintentional.

## 2016-07-12 ENCOUNTER — Ambulatory Visit: Payer: Self-pay

## 2016-07-12 ENCOUNTER — Encounter: Payer: Self-pay | Admitting: Family Medicine

## 2016-07-12 ENCOUNTER — Ambulatory Visit (INDEPENDENT_AMBULATORY_CARE_PROVIDER_SITE_OTHER): Payer: Medicare Other | Admitting: Internal Medicine

## 2016-07-12 ENCOUNTER — Encounter: Payer: Self-pay | Admitting: Internal Medicine

## 2016-07-12 ENCOUNTER — Ambulatory Visit (INDEPENDENT_AMBULATORY_CARE_PROVIDER_SITE_OTHER): Payer: Medicare Other | Admitting: Family Medicine

## 2016-07-12 VITALS — BP 128/74 | HR 81 | Wt 152.0 lb

## 2016-07-12 VITALS — BP 120/82 | HR 93 | Resp 16 | Ht 62.0 in | Wt 152.0 lb

## 2016-07-12 DIAGNOSIS — Z794 Long term (current) use of insulin: Secondary | ICD-10-CM | POA: Diagnosis not present

## 2016-07-12 DIAGNOSIS — E785 Hyperlipidemia, unspecified: Secondary | ICD-10-CM

## 2016-07-12 DIAGNOSIS — I10 Essential (primary) hypertension: Secondary | ICD-10-CM

## 2016-07-12 DIAGNOSIS — E1169 Type 2 diabetes mellitus with other specified complication: Secondary | ICD-10-CM | POA: Diagnosis not present

## 2016-07-12 DIAGNOSIS — E1142 Type 2 diabetes mellitus with diabetic polyneuropathy: Secondary | ICD-10-CM

## 2016-07-12 DIAGNOSIS — M654 Radial styloid tenosynovitis [de Quervain]: Secondary | ICD-10-CM

## 2016-07-12 DIAGNOSIS — E538 Deficiency of other specified B group vitamins: Secondary | ICD-10-CM | POA: Diagnosis not present

## 2016-07-12 DIAGNOSIS — K2100 Gastro-esophageal reflux disease with esophagitis, without bleeding: Secondary | ICD-10-CM | POA: Insufficient documentation

## 2016-07-12 MED ORDER — CYANOCOBALAMIN 1000 MCG/ML IJ SOLN
1000.0000 ug | Freq: Once | INTRAMUSCULAR | Status: AC
  Administered 2016-07-13: 1000 ug via INTRAMUSCULAR

## 2016-07-12 NOTE — Patient Instructions (Signed)
Good to see you  Ice in 6 hours and then as needed Exercises still 2-3 times a week Physical therapy will ne calling you  Brace at night for 2 weeks See me again in 3-4 weeks if not perfect

## 2016-07-12 NOTE — Assessment & Plan Note (Signed)
Patient given an injection today after having no improvement and if anything worsening symptoms. We discussed icing, topical anti-inflammatories. We discussed which activities to do an which was potentially avoid. Patient will monitor blood sugars closely. Referred to formal physical therapy. Continuing bracing at night. Follow-up again in 3-4 weeks for further evaluation and treatment.

## 2016-07-12 NOTE — Progress Notes (Signed)
Date:  07/12/2016   Name:  Anita Mercer   DOB:  10/30/1946   MRN:  [id number]   Chief Complaint: Establish Care Diabetes  She presents for her follow-up diabetic visit. She has type 2 (since 2000) diabetes mellitus. Her disease course has been stable. Associated symptoms include fatigue. Pertinent negatives for diabetes include no chest pain, no polydipsia and no polyuria. Current diabetic treatment includes oral agent (dual therapy) and insulin injections.  Hypertension  This is a chronic problem. The current episode started more than 1 year ago. The problem is unchanged. The problem is controlled. Pertinent negatives include no chest pain, palpitations or shortness of breath.  Gastroesophageal Reflux  She complains of belching. She reports no abdominal pain, no chest pain, no choking, no dysphagia, no heartburn, no sore throat or no water brash. This is a recurrent problem. The problem occurs occasionally. Associated symptoms include fatigue. She has tried a histamine-2 antagonist for the symptoms. Past procedures include an EGD (2016 - esophagitis, gastritis and duodenitis).  Hyperlipidemia  This is a chronic problem. The current episode started more than 1 year ago. The problem is controlled. Recent lipid tests were reviewed and are normal. Pertinent negatives include no chest pain or shortness of breath. Current antihyperlipidemic treatment includes statins. The current treatment provides significant improvement of lipids. Risk factors for coronary artery disease include diabetes mellitus and dyslipidemia.  B12 def - in April became very fatigued with worsening neuropathy for unclear reason.  Testing revealed low B12 - she received 3 weekly shots and is now due to start monthly.  She does feel like she is improving.  Lab Results  Component Value Date   HGBA1C 7.9 (H) 06/17/2016   BP Readings from Last 3 Encounters:  07/12/16 120/82  07/12/16 128/74  06/17/16 (!) 152/82     Review  of Systems  Constitutional: Positive for fatigue.  HENT: Negative for sore throat.   Eyes: Negative for visual disturbance.  Respiratory: Negative for choking and shortness of breath.   Cardiovascular: Negative for chest pain, palpitations and leg swelling.  Gastrointestinal: Negative for abdominal pain, dysphagia and heartburn.  Endocrine: Negative for polydipsia and polyuria.  Genitourinary: Negative for dysuria.  Neurological: Positive for numbness (neuropathy).    Patient Active Problem List   Diagnosis Date Noted  . Osteopenia determined by x-ray 07/09/2016  . De Quervain's tenosynovitis, right 06/17/2016  . Chronic fatigue 06/16/2016  . Abdominal cramping 05/28/2016  . GERD (gastroesophageal reflux disease) 06/04/2015  . Hearing loss 05/28/2014  . Benign essential tremor 05/10/2013  . Allergic rhinitis 03/15/2013  . Neuropathic pain 09/11/2012  . Paroxysmal nerve pain 09/11/2012  . Hyperlipidemia associated with type 2 diabetes mellitus ([place]) 10/01/2011  . Essential hypertension 10/01/2011  . Type 2 diabetes mellitus without complications ([place]) [id number]    Prior to Admission medications   Medication Sig Start Date End Date Taking? Authorizing Provider  aspirin EC 81 MG tablet Take 81 mg by mouth daily.     Yes Historical Provider, MD  B-D UF III MINI PEN NEEDLES 31G X 5 MM MISC CHECK BLOOD SUGAR TWO TO THREE TIMES DAILY. 12/11/15  Yes Jackolyn Confer, MD  Blood Glucose Calibration (OT ULTRA/FASTTK CNTRL SOLN) SOLN 1 drop by In Vitro route once. Please dispense controls for patient to use at home with One Touch Ultra machine. Dx code 250.0 03/15/13  Yes Jackolyn Confer, MD  CRANBERRY PO Take by mouth.   Yes Historical Provider, MD  fluticasone (  FLONASE) 50 MCG/ACT nasal spray Place 2 sprays into both nostrils daily. 02/21/14  Yes Jackolyn Confer, MD  glucose blood test strip Please dispense One Touch Ultra testing strips for patient to use with home device. Checking blood  sugars 2-3 times a day. Dx code 250.0 12/10/15  Yes Jackolyn Confer, MD  Insulin Glargine (LANTUS SOLOSTAR) 100 UNIT/ML Solostar Pen Inject 50 Units into the skin daily. 09/09/15  Yes Jackolyn Confer, MD  insulin lispro (HUMALOG) 100 UNIT/ML injection Use only w/blood sugar greater than >250 06/04/15  Yes Jackolyn Confer, MD  JANUVIA 100 MG tablet TAKE ONE TABLET BY MOUTH ONE TIME DAILY 11/03/15  Yes Jackolyn Confer, MD  Krill Oil 500 MG CAPS Take 1 capsule by mouth daily.   Yes Historical Provider, MD  Lancets (ONETOUCH ULTRASOFT) lancets USE TO CHECK BLOOD SUGAR 2-3 TIMES PER DAY 06/06/15  Yes Jackolyn Confer, MD  lisinopril (PRINIVIL,ZESTRIL) 10 MG tablet Take 1 tablet (10 mg total) by mouth daily. 12/10/15  Yes Jackolyn Confer, MD  loratadine (CLARITIN) 10 MG tablet Take 10 mg by mouth daily.   Yes Historical Provider, MD  metFORMIN (GLUCOPHAGE) 1000 MG tablet Take 1 tablet (1,000 mg total) by mouth 2 (two) times daily. 09/09/15  Yes Jackolyn Confer, MD  Methenamine-Sodium Salicylate (CYSTEX PO) Take by mouth.   Yes Historical Provider, MD  Multiple Vitamin (MULTIVITAMIN) tablet Take 1 tablet by mouth daily. Vitamin D3   Yes Historical Provider, MD  ranitidine (ZANTAC) 150 MG tablet Take 1 tablet (150 mg total) by mouth 2 (two) times daily. 05/03/16  Yes Jackolyn Confer, MD  simvastatin (ZOCOR) 20 MG tablet Take 1 tablet (20 mg total) by mouth at bedtime. 12/10/15  Yes Jackolyn Confer, MD  traMADol (ULTRAM) 50 MG tablet Take 1-2 tablets (50-100 mg total) by mouth 3 (three) times daily as needed. 06/16/16  Yes Jackolyn Confer, MD    Allergies  Allergen Reactions  . Ciprofloxacin     Worsening symptoms of neuropathy  . Omeprazole Itching, Swelling and Other (See Comments)  . Proton Pump Inhibitors     Other reaction(s): Angioedema    Past Surgical History:  Procedure Laterality Date  . ABDOMINAL HYSTERECTOMY    . CATARACT EXTRACTION Bilateral 10/18/11  04/04/13  .  CHOLECYSTECTOMY    . COLONOSCOPY WITH PROPOFOL N/A 08/11/2015   Procedure: COLONOSCOPY WITH PROPOFOL;  Surgeon: Lollie Sails, MD;  Location: [facility] ENDOSCOPY;  Service: Endoscopy;  Laterality: N/A;  . ESOPHAGOGASTRODUODENOSCOPY (EGD) WITH PROPOFOL N/A 08/11/2015   Procedure: ESOPHAGOGASTRODUODENOSCOPY (EGD) WITH PROPOFOL;  Surgeon: Lollie Sails, MD;  Location: [facility] ENDOSCOPY;  Service: Endoscopy;  Laterality: N/A;  . FOOT SURGERY     Dr. Paulla Dolly, bone spurs    Social History  Substance Use Topics  . Smoking status: Never Smoker  . Smokeless tobacco: Never Used  . Alcohol use No     Medication list has been reviewed and updated.   Physical Exam  Constitutional: She is oriented to person, place, and time. She appears well-developed. No distress.  HENT:  Head: Normocephalic and atraumatic.  Neck: Normal range of motion.  Cardiovascular: Normal rate, regular rhythm and normal heart sounds.   Pulmonary/Chest: Effort normal and breath sounds normal. No respiratory distress.  Abdominal: Soft. Bowel sounds are normal. She exhibits no distension. There is no tenderness. There is no rebound.  Musculoskeletal: Normal range of motion.  Neurological: She is alert and oriented to person, place, and  time.  Skin: Skin is warm and dry. No rash noted.  Psychiatric: She has a normal mood and affect. Her behavior is normal. Thought content normal.  Nursing note and vitals reviewed.   BP 120/82 (BP Location: Right Arm, Patient Position: Sitting, Cuff Size: Normal)   Pulse 93   Resp 16   Ht 5\' 2"  (1.575 m)   Wt 152 lb (68.9 kg)   SpO2 97%   BMI 27.80 kg/m   Assessment and Plan: 1. Type 2 diabetes mellitus with diabetic polyneuropathy, with long-term current use of insulin ([place]) Fair control - continue current therapy  2. Essential hypertension controlled  3. Gastroesophageal reflux disease with esophagitis Waiting for appt with Dr. Hilarie Fredrickson - [facility]  4. B12 deficiency -  cyanocobalamin ((VITAMIN B-12)) injection 1,000 mcg; Inject 1 mL (1,000 mcg total) into the muscle once.  5. Hyperlipidemia associated with type 2 diabetes mellitus ([place]) On statin therapy  Halina Maidens, MD [facility]  07/12/2016

## 2016-07-13 DIAGNOSIS — E538 Deficiency of other specified B group vitamins: Secondary | ICD-10-CM | POA: Diagnosis not present

## 2016-07-14 ENCOUNTER — Encounter: Payer: Self-pay | Admitting: Family Medicine

## 2016-07-15 ENCOUNTER — Ambulatory Visit: Payer: Medicare Other

## 2016-07-21 ENCOUNTER — Other Ambulatory Visit: Payer: Self-pay | Admitting: Internal Medicine

## 2016-08-02 ENCOUNTER — Encounter: Payer: Self-pay | Admitting: Internal Medicine

## 2016-08-02 NOTE — Telephone Encounter (Signed)
Dr. Hilarie Fredrickson reviewed records and has accepted patient. Ok to schedule Direct EGD. Procedure scheduled. Dr. Hilarie Fredrickson also states recall colon 07/2020. Recall colon has been entered.

## 2016-08-03 ENCOUNTER — Encounter: Payer: Self-pay | Admitting: Internal Medicine

## 2016-08-03 ENCOUNTER — Ambulatory Visit (INDEPENDENT_AMBULATORY_CARE_PROVIDER_SITE_OTHER): Payer: Medicare Other | Admitting: Internal Medicine

## 2016-08-03 VITALS — BP 152/80 | HR 97 | Temp 98.0°F | Resp 16 | Ht 62.0 in | Wt 153.0 lb

## 2016-08-03 DIAGNOSIS — E538 Deficiency of other specified B group vitamins: Secondary | ICD-10-CM | POA: Diagnosis not present

## 2016-08-03 DIAGNOSIS — N3000 Acute cystitis without hematuria: Secondary | ICD-10-CM | POA: Diagnosis not present

## 2016-08-03 LAB — POC URINALYSIS WITH MICROSCOPIC (NON AUTO)MANUAL RESULT
Bilirubin, UA: NEGATIVE
Blood, UA: NEGATIVE
CRYSTALS: 0
EPITHELIAL CELLS, URINE PER MICROSCOPY: 1
Glucose, UA: NEGATIVE
Ketones, UA: NEGATIVE
MUCUS UA: 0
Nitrite, UA: NEGATIVE
PH UA: 5
PROTEIN UA: NEGATIVE
RBC: 3 M/uL — AB (ref 4.04–5.48)
Spec Grav, UA: 1.025
Urobilinogen, UA: 0.2
WBC CASTS UA: 15

## 2016-08-03 MED ORDER — SULFAMETHOXAZOLE-TRIMETHOPRIM 800-160 MG PO TABS: 1.0000 | ORAL_TABLET | Freq: Two times a day (BID) | ORAL | 0 refills | Status: DC

## 2016-08-03 NOTE — Progress Notes (Signed)
Date:  08/03/2016   Name:  Anita Mercer   DOB:  Feb 26, 1946   MRN:  [id number]   Chief Complaint: Urinary Frequency and Dysuria  Dysuria   This is a new problem. The current episode started in the past 7 days. The problem occurs every urination. The problem has been gradually worsening. The quality of the pain is described as burning. The pain is mild. There has been no fever. Associated symptoms include frequency and urgency. Pertinent negatives include no chills, discharge, flank pain, hematuria or vomiting. She has tried increased fluids (and cranberry concentrate) for the symptoms. The treatment provided no relief. Her past medical history is significant for recurrent UTIs.     Review of Systems  Constitutional: Negative for chills, fatigue and fever.  Respiratory: Negative for chest tightness and shortness of breath.   Cardiovascular: Negative for chest pain.  Gastrointestinal: Negative for vomiting.  Genitourinary: Positive for dysuria, frequency and urgency. Negative for flank pain and hematuria.  Neurological: Negative for dizziness and headaches.    Patient Active Problem List   Diagnosis Date Noted  . B12 deficiency 07/12/2016  . Gastroesophageal reflux disease with esophagitis 07/12/2016  . Osteopenia determined by x-ray 07/09/2016  . De Quervain's tenosynovitis, right 06/17/2016  . Chronic fatigue 06/16/2016  . Abdominal cramping 05/28/2016  . GERD (gastroesophageal reflux disease) 06/04/2015  . Hearing loss 05/28/2014  . Benign essential tremor 05/10/2013  . Allergic rhinitis 03/15/2013  . Neuropathic pain 09/11/2012  . Paroxysmal nerve pain 09/11/2012  . Hyperlipidemia associated with type 2 diabetes mellitus ([place]) 10/01/2011  . Essential hypertension 10/01/2011  . Type 2 diabetes mellitus with diabetic polyneuropathy, with long-term current use of insulin ([place]) 10/01/2011    Prior to Admission medications   Medication Sig Start Date End Date Taking?  Authorizing Provider  aspirin EC 81 MG tablet Take 81 mg by mouth daily.     Yes Historical Provider, MD  B-D UF III MINI PEN NEEDLES 31G X 5 MM MISC CHECK BLOOD SUGAR TWO TO THREE TIMES DAILY. 12/11/15  Yes Jackolyn Confer, MD  Blood Glucose Calibration (OT ULTRA/FASTTK CNTRL SOLN) SOLN 1 drop by In Vitro route once. Please dispense controls for patient to use at home with One Touch Ultra machine. Dx code 250.0 03/15/13  Yes Jackolyn Confer, MD  CRANBERRY PO Take by mouth.   Yes Historical Provider, MD  fluticasone (FLONASE) 50 MCG/ACT nasal spray Place 2 sprays into both nostrils daily. 02/21/14  Yes Jackolyn Confer, MD  glucose blood test strip Please dispense One Touch Ultra testing strips for patient to use with home device. Checking blood sugars 2-3 times a day. Dx code 250.0 12/10/15  Yes Jackolyn Confer, MD  Insulin Glargine (LANTUS SOLOSTAR) 100 UNIT/ML Solostar Pen Inject 50 Units into the skin daily. 09/09/15  Yes Jackolyn Confer, MD  insulin lispro (HUMALOG) 100 UNIT/ML injection Use only w/blood sugar greater than >250 06/04/15  Yes Jackolyn Confer, MD  JANUVIA 100 MG tablet TAKE ONE TABLET BY MOUTH EVERY DAY 07/21/16  Yes [name], MD  Krill Oil 500 MG CAPS Take 1 capsule by mouth daily.   Yes Historical Provider, MD  Lancets (ONETOUCH ULTRASOFT) lancets USE TO CHECK BLOOD SUGAR 2-3 TIMES PER DAY 06/06/15  Yes Jackolyn Confer, MD  lisinopril (PRINIVIL,ZESTRIL) 10 MG tablet Take 1 tablet (10 mg total) by mouth daily. 12/10/15  Yes Jackolyn Confer, MD  loratadine (CLARITIN) 10 MG tablet Take 10  mg by mouth daily.   Yes Historical Provider, MD  metFORMIN (GLUCOPHAGE) 1000 MG tablet Take 1 tablet (1,000 mg total) by mouth 2 (two) times daily. 09/09/15  Yes Jackolyn Confer, MD  Methenamine-Sodium Salicylate (CYSTEX PO) Take by mouth.   Yes Historical Provider, MD  Multiple Vitamin (MULTIVITAMIN) tablet Take 1 tablet by mouth daily. Vitamin D3   Yes Historical Provider, MD    ranitidine (ZANTAC) 150 MG tablet Take 1 tablet (150 mg total) by mouth 2 (two) times daily. 05/03/16  Yes Jackolyn Confer, MD  simvastatin (ZOCOR) 20 MG tablet Take 1 tablet (20 mg total) by mouth at bedtime. 12/10/15  Yes Jackolyn Confer, MD  traMADol (ULTRAM) 50 MG tablet Take 1-2 tablets (50-100 mg total) by mouth 3 (three) times daily as needed. 06/16/16  Yes Jackolyn Confer, MD    Allergies  Allergen Reactions  . Ciprofloxacin     Worsening symptoms of neuropathy  . Omeprazole Itching, Swelling and Other (See Comments)  . Proton Pump Inhibitors     Other reaction(s): Angioedema    Past Surgical History:  Procedure Laterality Date  . ABDOMINAL HYSTERECTOMY    . CATARACT EXTRACTION Bilateral 10/18/11  04/04/13  . CHOLECYSTECTOMY    . COLONOSCOPY WITH PROPOFOL N/A 08/11/2015   Procedure: COLONOSCOPY WITH PROPOFOL;  Surgeon: Lollie Sails, MD;  Location: [facility] ENDOSCOPY;  Service: Endoscopy;  Laterality: N/A;  . ESOPHAGOGASTRODUODENOSCOPY (EGD) WITH PROPOFOL N/A 08/11/2015   Procedure: ESOPHAGOGASTRODUODENOSCOPY (EGD) WITH PROPOFOL;  Surgeon: Lollie Sails, MD;  Location: [facility] ENDOSCOPY;  Service: Endoscopy;  Laterality: N/A;  . FOOT SURGERY     Dr. Paulla Dolly, bone spurs    Social History  Substance Use Topics  . Smoking status: Never Smoker  . Smokeless tobacco: Never Used  . Alcohol use No     Medication list has been reviewed and updated.   Physical Exam  Constitutional: She appears well-developed and well-nourished.  Cardiovascular: Normal rate, regular rhythm and normal heart sounds.   Pulmonary/Chest: Effort normal and breath sounds normal. No respiratory distress.  Abdominal: Soft. Bowel sounds are normal. There is tenderness in the suprapubic area. There is no rebound, no guarding and no CVA tenderness.  Musculoskeletal: She exhibits no edema.  Psychiatric: She has a normal mood and affect.  Nursing note and vitals reviewed.   BP (!) 152/80 (BP Location:  Right Arm, Patient Position: Sitting, Cuff Size: Normal)   Pulse 97   Temp 98 F (36.7 C) (Oral)   Resp 16   Ht 5\' 2"  (1.575 m)   Wt 153 lb (69.4 kg)   SpO2 100%   BMI 27.98 kg/m   Assessment and Plan: 1. Acute cystitis without hematuria Increase fluids - POC urinalysis w microscopic (non auto) - sulfamethoxazole-trimethoprim (BACTRIM DS,SEPTRA DS) 800-160 MG tablet; Take 1 tablet by mouth 2 (two) times daily.  Dispense: 14 tablet; Refill: 0  2. B12 deficiency Schedule monthly nurse visit for injection   Halina Maidens, MD [facility]  08/03/2016

## 2016-08-08 ENCOUNTER — Encounter: Payer: Self-pay | Admitting: Internal Medicine

## 2016-08-09 ENCOUNTER — Ambulatory Visit (INDEPENDENT_AMBULATORY_CARE_PROVIDER_SITE_OTHER): Payer: Medicare Other

## 2016-08-09 ENCOUNTER — Other Ambulatory Visit: Payer: Self-pay | Admitting: Internal Medicine

## 2016-08-09 VITALS — Resp 16 | Ht 62.0 in | Wt 156.0 lb

## 2016-08-09 DIAGNOSIS — E538 Deficiency of other specified B group vitamins: Secondary | ICD-10-CM | POA: Diagnosis not present

## 2016-08-09 DIAGNOSIS — R300 Dysuria: Secondary | ICD-10-CM | POA: Diagnosis not present

## 2016-08-09 LAB — POCT URINALYSIS DIPSTICK
BILIRUBIN UA: NEGATIVE
GLUCOSE UA: NEGATIVE
KETONES UA: NEGATIVE
Leukocytes, UA: NEGATIVE
NITRITE UA: NEGATIVE
Protein, UA: NEGATIVE
RBC UA: NEGATIVE
Spec Grav, UA: <=1.005
Urobilinogen, UA: 0.2
pH, UA: 6

## 2016-08-09 MED ORDER — CYANOCOBALAMIN 1000 MCG/ML IJ SOLN
1000.0000 ug | Freq: Once | INTRAMUSCULAR | Status: AC
  Administered 2016-08-09: 1000 ug via INTRAMUSCULAR

## 2016-08-12 ENCOUNTER — Ambulatory Visit: Payer: Medicare Other | Admitting: Family Medicine

## 2016-08-13 LAB — URINE CULTURE

## 2016-08-26 ENCOUNTER — Ambulatory Visit: Payer: Medicare Other | Admitting: Family Medicine

## 2016-09-06 ENCOUNTER — Ambulatory Visit (INDEPENDENT_AMBULATORY_CARE_PROVIDER_SITE_OTHER): Payer: Medicare Other

## 2016-09-06 DIAGNOSIS — E538 Deficiency of other specified B group vitamins: Secondary | ICD-10-CM

## 2016-09-06 MED ORDER — CYANOCOBALAMIN 1000 MCG/ML IJ SOLN
1000.0000 ug | Freq: Once | INTRAMUSCULAR | Status: AC
  Administered 2016-09-06: 1000 ug via INTRAMUSCULAR

## 2016-10-04 ENCOUNTER — Ambulatory Visit (AMBULATORY_SURGERY_CENTER): Payer: Self-pay | Admitting: *Deleted

## 2016-10-04 VITALS — Ht 61.0 in | Wt 150.0 lb

## 2016-10-04 DIAGNOSIS — K2270 Barrett's esophagus without dysplasia: Secondary | ICD-10-CM

## 2016-10-04 NOTE — Progress Notes (Signed)
Eats grain berry honey nut with onyx sorghum several times a week to help with constipation- no constipation- was told to use miralax but this works better- stools are soft, easy to pass   Had tried phazyme in the past for abd pain - caused more pain- had terrible rxn to prilosec as well - caused angioedema   No egg or soy allergy known to patient  No issues with past sedation with any surgeries  or procedures, no intubation problems  No diet pills per patient No home 02 use per patient  No blood thinners per patient  Pt denies issues with constipation  No A fib or A flutter   Reports, labs , pt hx put on JMP desk to revire- pt has complicated med hx

## 2016-10-05 ENCOUNTER — Encounter: Payer: Self-pay | Admitting: Internal Medicine

## 2016-10-12 ENCOUNTER — Encounter: Payer: Self-pay | Admitting: Internal Medicine

## 2016-10-12 ENCOUNTER — Ambulatory Visit (INDEPENDENT_AMBULATORY_CARE_PROVIDER_SITE_OTHER): Payer: Medicare Other | Admitting: Internal Medicine

## 2016-10-12 VITALS — BP 128/80 | HR 78 | Ht 61.0 in | Wt 141.0 lb

## 2016-10-12 DIAGNOSIS — K2100 Gastro-esophageal reflux disease with esophagitis, without bleeding: Secondary | ICD-10-CM

## 2016-10-12 DIAGNOSIS — E538 Deficiency of other specified B group vitamins: Secondary | ICD-10-CM | POA: Diagnosis not present

## 2016-10-12 DIAGNOSIS — E1142 Type 2 diabetes mellitus with diabetic polyneuropathy: Secondary | ICD-10-CM

## 2016-10-12 DIAGNOSIS — E782 Mixed hyperlipidemia: Secondary | ICD-10-CM | POA: Diagnosis not present

## 2016-10-12 DIAGNOSIS — I10 Essential (primary) hypertension: Secondary | ICD-10-CM

## 2016-10-12 DIAGNOSIS — Z794 Long term (current) use of insulin: Secondary | ICD-10-CM

## 2016-10-12 LAB — POC URINALYSIS WITH MICROSCOPIC (NON AUTO)MANUAL RESULT
CRYSTALS: 0
GLUCOSE UA: NEGATIVE
Ketones, UA: NEGATIVE
MUCUS UA: 0
NITRITE UA: POSITIVE
PROTEIN UA: NEGATIVE
RBC: 2 M/uL — AB (ref 4.04–5.48)
Spec Grav, UA: >=1.03
Urobilinogen, UA: 0.2
WBC Casts, UA: 2
pH, UA: 5

## 2016-10-12 MED ORDER — TRAMADOL HCL 50 MG PO TABS: 50.0000 mg | ORAL_TABLET | Freq: Three times a day (TID) | ORAL | 2 refills | Status: DC | PRN

## 2016-10-12 MED ORDER — INSULIN LISPRO 100 UNIT/ML (KWIKPEN)
10.0000 [IU] | PEN_INJECTOR | Freq: Three times a day (TID) | SUBCUTANEOUS | 3 refills | Status: DC
Start: 2016-10-12 — End: 2017-04-25

## 2016-10-12 MED ORDER — CYANOCOBALAMIN 1000 MCG/ML IJ SOLN
1000.0000 ug | Freq: Once | INTRAMUSCULAR | Status: AC
  Administered 2016-10-12: 1000 ug via INTRAMUSCULAR

## 2016-10-12 MED ORDER — SIMVASTATIN 20 MG PO TABS: 20.0000 mg | ORAL_TABLET | Freq: Every day | ORAL | 3 refills | Status: DC

## 2016-10-12 MED ORDER — LISINOPRIL 10 MG PO TABS: 10.0000 mg | ORAL_TABLET | Freq: Every day | ORAL | 3 refills | Status: DC

## 2016-10-12 MED ORDER — SITAGLIPTIN PHOSPHATE 100 MG PO TABS: 100.0000 mg | ORAL_TABLET | Freq: Every day | ORAL | 3 refills | Status: DC

## 2016-10-12 MED ORDER — INSULIN GLARGINE 100 UNIT/ML SOLOSTAR PEN: 50.0000 [IU] | PEN_INJECTOR | Freq: Every day | SUBCUTANEOUS | 3 refills | Status: DC

## 2016-10-12 MED ORDER — METFORMIN HCL 1000 MG PO TABS: 1000.0000 mg | ORAL_TABLET | Freq: Two times a day (BID) | ORAL | 3 refills | Status: DC

## 2016-10-12 NOTE — Progress Notes (Signed)
Date:  10/12/2016   Name:  Anita Mercer   DOB:  1946/08/20   MRN:  [id number]   Chief Complaint: Diabetes (needs med refilled - 90 days requested) Diabetes  She presents for her follow-up diabetic visit. She has type 2 diabetes mellitus. Her disease course has been fluctuating. Pertinent negatives for diabetes include no chest pain. Symptoms are stable. Current diabetic treatment includes intensive insulin program and oral agent (dual therapy) Celesta Gentile and metformin). Her weight is stable. Her breakfast blood glucose is taken between 7-8 am. Her breakfast blood glucose range is generally 110-130 mg/dl.  Gastroesophageal Reflux  She complains of abdominal pain and heartburn. She reports no chest pain, no choking or no nausea. This is a recurrent problem. She has tried a histamine-2 antagonist for the symptoms. egd planned for next week.   Urine problems - recurrent cystitis issues in the recent past.  Now urine is dark but no dysuria or bleeding.  She feels like she drinks at least 80 oz of fluids per day.  Lab Results  Component Value Date   HGBA1C 7.9 (H) 06/17/2016     Review of Systems  Constitutional: Negative for chills, diaphoresis and fever.  Eyes: Negative for visual disturbance.  Respiratory: Negative for choking and shortness of breath.   Cardiovascular: Negative for chest pain and palpitations.  Gastrointestinal: Positive for abdominal pain and heartburn. Negative for nausea and vomiting.  Genitourinary: Negative for decreased urine volume, dysuria, hematuria, pelvic pain and urgency.       Dark urine    Patient Active Problem List   Diagnosis Date Noted  . B12 deficiency 07/12/2016  . Gastroesophageal reflux disease with esophagitis 07/12/2016  . Osteopenia determined by x-ray 07/09/2016  . De Quervain's tenosynovitis, right 06/17/2016  . Chronic fatigue 06/16/2016  . Hearing loss 05/28/2014  . Benign essential tremor 05/10/2013  . Allergic rhinitis 03/15/2013    . Neuropathic pain 09/11/2012  . Paroxysmal nerve pain 09/11/2012  . Hyperlipidemia associated with type 2 diabetes mellitus ([place]) 10/01/2011  . Essential hypertension 10/01/2011  . Type 2 diabetes mellitus with diabetic polyneuropathy, with long-term current use of insulin ([place]) 10/01/2011    Prior to Admission medications   Medication Sig Start Date End Date Taking? Authorizing Provider  aspirin EC 81 MG tablet Take 81 mg by mouth daily.     Yes Historical Provider, MD  B-D UF III MINI PEN NEEDLES 31G X 5 MM MISC CHECK BLOOD SUGAR TWO TO THREE TIMES DAILY. 12/11/15  Yes Jackolyn Confer, MD  Blood Glucose Calibration (OT ULTRA/FASTTK CNTRL SOLN) SOLN 1 drop by In Vitro route once. Please dispense controls for patient to use at home with One Touch Ultra machine. Dx code 250.0 03/15/13  Yes Jackolyn Confer, MD  CRANBERRY PO Take by mouth.   Yes Historical Provider, MD  fluticasone (FLONASE) 50 MCG/ACT nasal spray Place 2 sprays into both nostrils daily. 02/21/14  Yes Jackolyn Confer, MD  glucose blood test strip Please dispense One Touch Ultra testing strips for patient to use with home device. Checking blood sugars 2-3 times a day. Dx code 250.0 12/10/15  Yes Jackolyn Confer, MD  Insulin Glargine (LANTUS SOLOSTAR) 100 UNIT/ML Solostar Pen Inject 50 Units into the skin daily. 09/09/15  Yes Jackolyn Confer, MD  insulin lispro (HUMALOG) 100 UNIT/ML injection Use only w/blood sugar greater than >250 06/04/15  Yes Jackolyn Confer, MD  JANUVIA 100 MG tablet TAKE ONE TABLET BY MOUTH EVERY  DAY 07/21/16  Yes [name], MD  Krill Oil 500 MG CAPS Take 1 capsule by mouth daily.   Yes Historical Provider, MD  Lancets (ONETOUCH ULTRASOFT) lancets USE TO CHECK BLOOD SUGAR 2-3 TIMES PER DAY 06/06/15  Yes Jackolyn Confer, MD  lisinopril (PRINIVIL,ZESTRIL) 10 MG tablet Take 1 tablet (10 mg total) by mouth daily. 12/10/15  Yes Jackolyn Confer, MD  loratadine (CLARITIN) 10 MG tablet Take 10 mg by  mouth daily.   Yes Historical Provider, MD  metFORMIN (GLUCOPHAGE) 1000 MG tablet Take 1 tablet (1,000 mg total) by mouth 2 (two) times daily. 09/09/15  Yes Jackolyn Confer, MD  Methenamine-Sodium Salicylate (CYSTEX PO) Take by mouth.   Yes Historical Provider, MD  Multiple Vitamin (MULTIVITAMIN) tablet Take 1 tablet by mouth daily. Vitamin D3   Yes Historical Provider, MD  ranitidine (ZANTAC) 150 MG tablet Take 1 tablet (150 mg total) by mouth 2 (two) times daily. 05/03/16  Yes Jackolyn Confer, MD  simvastatin (ZOCOR) 20 MG tablet Take 1 tablet (20 mg total) by mouth at bedtime. 12/10/15  Yes Jackolyn Confer, MD  traMADol (ULTRAM) 50 MG tablet Take 1-2 tablets (50-100 mg total) by mouth 3 (three) times daily as needed. 06/16/16  Yes Jackolyn Confer, MD    Allergies  Allergen Reactions  . Ciprofloxacin     Worsening symptoms of neuropathy  . Omeprazole Itching, Swelling and Other (See Comments)  . Proton Pump Inhibitors     Other reaction(s): Angioedema--prilosec     Past Surgical History:  Procedure Laterality Date  . ABDOMINAL HYSTERECTOMY    . ACHILLES TENDON SURGERY  2006   removela of bone spur  . BUNIONECTOMY    . CATARACT EXTRACTION Bilateral 10/18/11  04/04/13  . CHOLECYSTECTOMY    . COLONOSCOPY  01/13/2011  . COLONOSCOPY WITH PROPOFOL N/A 08/11/2015   Procedure: COLONOSCOPY WITH PROPOFOL;  Surgeon: Lollie Sails, MD;  Location: [facility] ENDOSCOPY;  Service: Endoscopy;  Laterality: N/A;  . echo  10/23/2012  . ESOPHAGOGASTRODUODENOSCOPY (EGD) WITH PROPOFOL N/A 08/11/2015   Procedure: ESOPHAGOGASTRODUODENOSCOPY (EGD) WITH PROPOFOL;  Surgeon: Lollie Sails, MD;  Location: [facility] ENDOSCOPY;  Service: Endoscopy;  Laterality: N/A;  . FOOT SURGERY     Dr. Paulla Dolly, bone spurs  . skin tags    . UPPER GASTROINTESTINAL ENDOSCOPY  08/08/2015   with MAC- barretts and duodenitis    Social History  Substance Use Topics  . Smoking status: Never Smoker  . Smokeless tobacco: Never  Used  . Alcohol use No     Medication list has been reviewed and updated.   Physical Exam  Constitutional: She is oriented to person, place, and time. She appears well-developed. No distress.  HENT:  Head: Normocephalic and atraumatic.  Neck: Normal range of motion. No thyromegaly present.  Cardiovascular: Normal rate, regular rhythm and normal heart sounds.   Pulmonary/Chest: Effort normal and breath sounds normal. No respiratory distress. She has no wheezes.  Abdominal: Soft. Bowel sounds are normal. She exhibits no distension. There is no tenderness.  Musculoskeletal: Normal range of motion.  Neurological: She is alert and oriented to person, place, and time.  Skin: Skin is warm and dry. No rash noted.  Psychiatric: She has a normal mood and affect. Her behavior is normal. Thought content normal.  Nursing note and vitals reviewed.   BP 128/80   Pulse 78   Ht 5\' 1"  (1.549 m)   Wt 141 lb (64 kg)   BMI 26.64  kg/m   Assessment and Plan: 1. Type 2 diabetes mellitus with diabetic polyneuropathy, with long-term current use of insulin ([facility]) Continue current therapy - Hemoglobin A1c - Insulin Glargine (LANTUS SOLOSTAR) 100 UNIT/ML Solostar Pen; Inject 50 Units into the skin daily.  Dispense: 15 pen; Refill: 3 - insulin lispro (HUMALOG KWIKPEN) 100 UNIT/ML KiwkPen; Inject 0.1 mLs (10 Units total) into the skin 3 (three) times daily.  Dispense: 15 pen; Refill: 3 - sitaGLIPtin (JANUVIA) 100 MG tablet; Take 1 tablet (100 mg total) by mouth daily.  Dispense: 90 tablet; Refill: 3 - metFORMIN (GLUCOPHAGE) 1000 MG tablet; Take 1 tablet (1,000 mg total) by mouth 2 (two) times daily.  Dispense: 180 tablet; Refill: 3 - POC urinalysis w microscopic (non auto)  2. Gastroesophageal reflux disease with esophagitis EGD planned for next week  3. B12 deficiency Continue monthly injections Check level in 3-4 months just prior to next injection - cyanocobalamin ((VITAMIN B-12)) injection 1,000  mcg; Inject 1 mL (1,000 mcg total) into the muscle once.  4. Essential hypertension controlled - lisinopril (PRINIVIL,ZESTRIL) 10 MG tablet; Take 1 tablet (10 mg total) by mouth daily.  Dispense: 90 tablet; Refill: 3  5. Mixed hyperlipidemia On statin therapy - simvastatin (ZOCOR) 20 MG tablet; Take 1 tablet (20 mg total) by mouth at bedtime.  Dispense: 90 tablet; Refill: 3  6. Hx of UTI UA and microscopic exam unremarkable today.  Increase fluids as able Call for antibiotics if other sx develop over the next few days  Halina Maidens, MD [facility]  10/12/2016

## 2016-10-13 LAB — HEMOGLOBIN A1C
ESTIMATED AVERAGE GLUCOSE: 174 mg/dL
HEMOGLOBIN A1C: 7.7 % — AB (ref 4.8–5.6)

## 2016-10-18 ENCOUNTER — Ambulatory Visit (AMBULATORY_SURGERY_CENTER): Payer: Medicare Other | Admitting: Internal Medicine

## 2016-10-18 ENCOUNTER — Encounter: Payer: Self-pay | Admitting: Internal Medicine

## 2016-10-18 VITALS — BP 129/71 | HR 69 | Temp 98.6°F | Resp 13 | Ht 61.0 in | Wt 150.0 lb

## 2016-10-18 DIAGNOSIS — K2270 Barrett's esophagus without dysplasia: Secondary | ICD-10-CM

## 2016-10-18 MED ORDER — SODIUM CHLORIDE 0.9 % IV SOLN: 500.0000 mL | INTRAVENOUS | Status: DC

## 2016-10-18 NOTE — Patient Instructions (Signed)
Impression/Recommendations:  YOU HAD AN ENDOSCOPIC PROCEDURE TODAY AT [place]:   Refer to the procedure report that was given to you for any specific questions about what was found during the examination.  If the procedure report does not answer your questions, please call your gastroenterologist to clarify.  If you requested that your care partner not be given the details of your procedure findings, then the procedure report has been included in a sealed envelope for you to review at your convenience later.  YOU SHOULD EXPECT: Some feelings of bloating in the abdomen. Passage of more gas than usual.  Walking can help get rid of the air that was put into your GI tract during the procedure and reduce the bloating. If you had a lower endoscopy (such as a colonoscopy or flexible sigmoidoscopy) you may notice spotting of blood in your stool or on the toilet paper. If you underwent a bowel prep for your procedure, you may not have a normal bowel movement for a few days.  Please Note:  You might notice some irritation and congestion in your nose or some drainage.  This is from the oxygen used during your procedure.  There is no need for concern and it should clear up in a day or so.  SYMPTOMS TO REPORT IMMEDIATELY:   Following lower endoscopy (colonoscopy or flexible sigmoidoscopy):  Excessive amounts of blood in the stool  Significant tenderness or worsening of abdominal pains  Swelling of the abdomen that is new, acute  Fever of 100F or higher For urgent or emergent issues, a gastroenterologist can be reached at any hour by calling [phone].   DIET:  We do recommend a small meal at first, but then you may proceed to your regular diet.  Drink plenty of fluids but you should avoid alcoholic beverages for 24 hours.  ACTIVITY:  You should plan to take it easy for the rest of today and you should NOT DRIVE or use heavy machinery until tomorrow (because of the sedation  medicines used during the test).    FOLLOW UP: Our staff will call the number listed on your records the next business day following your procedure to check on you and address any questions or concerns that you may have regarding the information given to you following your procedure. If we do not reach you, we will leave a message.  However, if you are feeling well and you are not experiencing any problems, there is no need to return our call.  We will assume that you have returned to your regular daily activities without incident.  If any biopsies were taken you will be contacted by phone or by letter within the next 1-3 weeks.  Please call us at [phone] if you have not heard about the biopsies in 3 weeks.    SIGNATURES/CONFIDENTIALITY: You and/or your care partner have signed paperwork which will be entered into your electronic medical record.  These signatures attest to the fact that that the information above on your After Visit Summary has been reviewed and is understood.  Full responsibility of the confidentiality of this discharge information lies with you and/or your care-partner.

## 2016-10-18 NOTE — Op Note (Signed)
[place] Patient Name: Anita Mercer Procedure Date: 10/18/2016 9:00 AM MRN: [id number] Endoscopist: Jerene Bears , MD Age: 70 Referring MD:  Date of Birth: 1946-01-23 Gender: Female Account #: [id number] Procedure:                Upper GI endoscopy Indications:              Follow-up of Barrett's esophagus diagnosis Sept                            2016 (Dr. Gustavo Lah). Chronic active inflammation in                            distal esophagus with foci of intestinal metaplasia                            without dysplasia Medicines:                Monitored Anesthesia Care Procedure:                Pre-Anesthesia Assessment:                           - Prior to the procedure, a History and Physical                            was performed, and patient medications and                            allergies were reviewed. The patient's tolerance of                            previous anesthesia was also reviewed. The risks                            and benefits of the procedure and the sedation                            options and risks were discussed with the patient.                            All questions were answered, and informed consent                            was obtained. Prior Anticoagulants: The patient has                            taken no previous anticoagulant or antiplatelet                            agents. ASA Grade Assessment: III - A patient with                            severe systemic disease. After reviewing the risks  and benefits, the patient was deemed in                            satisfactory condition to undergo the procedure.                           After obtaining informed consent, the endoscope was                            passed under direct vision. Throughout the                            procedure, the patient's blood pressure, pulse, and                            oxygen saturations were monitored  continuously. The                            Model GIF-[id number]) scope was introduced                            through the mouth, and advanced to the second part                            of duodenum. The upper GI endoscopy was                            accomplished without difficulty. The patient                            tolerated the procedure well. Scope In: Scope Out: Findings:                 The esophagus and gastroesophageal junction were                            examined with white light and narrow band imaging                            (NBI) from a forward view and retroflexed position.                            There was no visual evidence of Barrett's esophagus.                           The entire examined stomach was normal.                           The examined duodenum was normal. Complications:            No immediate complications. Estimated Blood Loss:     Estimated blood loss: none. Impression:               - There is no endoscopic evidence of Barrett's  esophagus.                           - Normal stomach.                           - Normal examined duodenum.                           - No specimens collected. Recommendation:           - Patient has a contact number available for                            emergencies. The signs and symptoms of potential                            delayed complications were discussed with the                            patient. Return to normal activities tomorrow.                            Written discharge instructions were provided to the                            patient.                           - Resume previous diet.                           - Continue present medications. Jerene Bears, MD 10/18/2016 9:17:22 AM This report has been signed electronically.

## 2016-10-18 NOTE — Progress Notes (Signed)
Report to PACU, RN, vss, BBS= Clear.  

## 2016-10-19 ENCOUNTER — Telehealth: Payer: Self-pay

## 2016-10-19 NOTE — Telephone Encounter (Signed)
  Follow up Call-  Call back number 10/18/2016  Post procedure Call Back phone  # [phone]  Permission to leave phone message Yes  Some recent data might be hidden     Patient questions:  Do you have a fever, pain , or abdominal swelling? No. Pain Score  0 *  Have you tolerated food without any problems? Yes.    Have you been able to return to your normal activities? Yes.    Do you have any questions about your discharge instructions: Diet   No. Medications  No. Follow up visit  No.  Do you have questions or concerns about your Care? No.  Actions: * If pain score is 4 or above: No action needed, pain <4.  The correct telephone number is [phone] and I was able to speak with the pt.  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  No problems noted per pt. maw

## 2016-10-28 ENCOUNTER — Other Ambulatory Visit: Payer: Self-pay | Admitting: Internal Medicine

## 2016-10-28 DIAGNOSIS — Z794 Long term (current) use of insulin: Principal | ICD-10-CM

## 2016-10-28 DIAGNOSIS — E1142 Type 2 diabetes mellitus with diabetic polyneuropathy: Secondary | ICD-10-CM

## 2016-11-23 ENCOUNTER — Encounter: Payer: Self-pay | Admitting: Internal Medicine

## 2016-11-23 ENCOUNTER — Other Ambulatory Visit: Payer: Self-pay | Admitting: Internal Medicine

## 2016-11-23 ENCOUNTER — Ambulatory Visit (INDEPENDENT_AMBULATORY_CARE_PROVIDER_SITE_OTHER): Payer: Medicare Other | Admitting: Internal Medicine

## 2016-11-23 VITALS — BP 146/86 | HR 92 | Temp 97.6°F | Ht 62.0 in | Wt 152.0 lb

## 2016-11-23 DIAGNOSIS — E538 Deficiency of other specified B group vitamins: Secondary | ICD-10-CM

## 2016-11-23 DIAGNOSIS — Z794 Long term (current) use of insulin: Secondary | ICD-10-CM

## 2016-11-23 DIAGNOSIS — E1142 Type 2 diabetes mellitus with diabetic polyneuropathy: Secondary | ICD-10-CM | POA: Diagnosis not present

## 2016-11-23 DIAGNOSIS — N3000 Acute cystitis without hematuria: Secondary | ICD-10-CM | POA: Diagnosis not present

## 2016-11-23 LAB — POC URINALYSIS WITH MICROSCOPIC (NON AUTO)MANUAL RESULT
BILIRUBIN UA: NEGATIVE
Crystals: 0
GLUCOSE UA: NEGATIVE
KETONES UA: NEGATIVE
Mucus, UA: 0
Nitrite, UA: NEGATIVE
PH UA: 5
Protein, UA: NEGATIVE
RBC UA: NEGATIVE
RBC: 0 M/uL — AB (ref 4.04–5.48)
SPEC GRAV UA: 1.025
UROBILINOGEN UA: 0.2
WBC CASTS UA: 5

## 2016-11-23 MED ORDER — SULFAMETHOXAZOLE-TRIMETHOPRIM 800-160 MG PO TABS: 1.0000 | ORAL_TABLET | Freq: Two times a day (BID) | ORAL | 0 refills | Status: DC

## 2016-11-23 MED ORDER — CYANOCOBALAMIN 1000 MCG/ML IJ SOLN
1000.0000 ug | Freq: Once | INTRAMUSCULAR | Status: AC
  Administered 2016-11-23: 1000 ug via INTRAMUSCULAR

## 2016-11-23 NOTE — Progress Notes (Signed)
Date:  11/23/2016   Name:  Anita Mercer   DOB:  04/06/1946   MRN:  [id number]   Chief Complaint: Urinary Tract Infection Urinary Tract Infection   This is a new problem. The current episode started in the past 7 days. The problem has been gradually worsening. The quality of the pain is described as burning. The pain is mild. There has been no fever. Associated symptoms include frequency, hesitancy and urgency. Pertinent negatives include no chills, discharge, flank pain, hematuria, nausea or vomiting. Her past medical history is significant for recurrent UTIs. has never seen Urology - last few infections had negative culture and not much improvement with treatment.  Diabetes  She presents for her follow-up diabetic visit. She has type 2 diabetes mellitus. Pertinent negatives for hypoglycemia include no dizziness, headaches or pallor. Pertinent negatives for diabetes include no chest pain and no fatigue.  having more elevated readings requiring use of SS Humalog.  Usually does not take any extra (only takes for BS > 225).  She has noticed more itching as well - not sure if this is her neuropathy or something else.     Review of Systems  Constitutional: Negative for chills, fatigue and fever.  HENT: Negative for hearing loss and tinnitus.   Eyes: Negative for visual disturbance.  Respiratory: Negative for cough, chest tightness and shortness of breath.   Cardiovascular: Negative for chest pain and palpitations.  Gastrointestinal: Negative for diarrhea, nausea and vomiting.  Genitourinary: Positive for frequency, hesitancy and urgency. Negative for difficulty urinating, flank pain, hematuria and pelvic pain.  Skin: Negative for pallor, rash and wound.  Neurological: Negative for dizziness and headaches.    Patient Active Problem List   Diagnosis Date Noted  . B12 deficiency 07/12/2016  . Gastroesophageal reflux disease with esophagitis 07/12/2016  . Osteopenia determined by x-ray  07/09/2016  . De Quervain's tenosynovitis, right 06/17/2016  . Chronic fatigue 06/16/2016  . Hearing loss 05/28/2014  . Benign essential tremor 05/10/2013  . Allergic rhinitis 03/15/2013  . Neuropathic pain 09/11/2012  . Paroxysmal nerve pain 09/11/2012  . Hyperlipidemia associated with type 2 diabetes mellitus ([place]) 10/01/2011  . Essential hypertension 10/01/2011  . Type 2 diabetes mellitus with diabetic polyneuropathy, with long-term current use of insulin ([place]) 10/01/2011    Prior to Admission medications   Medication Sig Start Date End Date Taking? Authorizing Provider  aspirin EC 81 MG tablet Take 81 mg by mouth daily.      Historical Provider, MD  B-D UF III MINI PEN NEEDLES 31G X 5 MM MISC CHECK BLOOD SUGAR TWO TO THREE TIMES DAILY. 12/11/15   Jackolyn Confer, MD  Blood Glucose Calibration (OT ULTRA/FASTTK CNTRL SOLN) SOLN 1 drop by In Vitro route once. Please dispense controls for patient to use at home with One Touch Ultra machine. Dx code 250.0 03/15/13   Jackolyn Confer, MD  CRANBERRY PO Take by mouth.    Historical Provider, MD  fluticasone (FLONASE) 50 MCG/ACT nasal spray Place 2 sprays into both nostrils daily. Patient not taking: Reported on 10/18/2016 02/21/14   Jackolyn Confer, MD  glucose blood test strip Please dispense One Touch Ultra testing strips for patient to use with home device. Checking blood sugars 2-3 times a day. Dx code 250.0 12/10/15   Jackolyn Confer, MD  insulin lispro (HUMALOG KWIKPEN) 100 UNIT/ML KiwkPen Inject 0.1 mLs (10 Units total) into the skin 3 (three) times daily. 10/12/16   [name], MD  Krill Oil 500 MG CAPS Take 1 capsule by mouth daily.    Historical Provider, MD  Lancets [facility] ULTRASOFT) lancets USE TO CHECK BLOOD SUGAR 2-3 TIMES PER DAY 06/06/15   Jackolyn Confer, MD  LANTUS SOLOSTAR 100 UNIT/ML Solostar Pen INJECT 50 UNITS INTO THE SKIN ONCE DAILY. 10/28/16   [name], MD  lisinopril (PRINIVIL,ZESTRIL) 10 MG tablet  Take 1 tablet (10 mg total) by mouth daily. 10/12/16   [name], MD  loratadine (CLARITIN) 10 MG tablet Take 10 mg by mouth daily.    Historical Provider, MD  metFORMIN (GLUCOPHAGE) 1000 MG tablet Take 1 tablet (1,000 mg total) by mouth 2 (two) times daily. 10/12/16   [name], MD  Methenamine-Sodium Salicylate (CYSTEX PO) Take by mouth.    Historical Provider, MD  Multiple Vitamin (MULTIVITAMIN) tablet Take 1 tablet by mouth daily. Vitamin D3    Historical Provider, MD  ranitidine (ZANTAC) 150 MG tablet Take 1 tablet (150 mg total) by mouth 2 (two) times daily. 05/03/16   Jackolyn Confer, MD  simvastatin (ZOCOR) 20 MG tablet Take 1 tablet (20 mg total) by mouth at bedtime. 10/12/16   [name], MD  sitaGLIPtin (JANUVIA) 100 MG tablet Take 1 tablet (100 mg total) by mouth daily. 10/12/16   [name], MD  traMADol (ULTRAM) 50 MG tablet Take 1-2 tablets (50-100 mg total) by mouth 3 (three) times daily as needed. 10/12/16   [name], MD    Allergies  Allergen Reactions  . Ciprofloxacin     Worsening symptoms of neuropathy  . Omeprazole Itching, Swelling and Other (See Comments)  . Proton Pump Inhibitors     Other reaction(s): Angioedema--prilosec     Past Surgical History:  Procedure Laterality Date  . ABDOMINAL HYSTERECTOMY    . ACHILLES TENDON SURGERY  2006   removela of bone spur  . BUNIONECTOMY    . CATARACT EXTRACTION Bilateral 10/18/11  04/04/13  . CHOLECYSTECTOMY    . COLONOSCOPY  01/13/2011  . COLONOSCOPY WITH PROPOFOL N/A 08/11/2015   Procedure: COLONOSCOPY WITH PROPOFOL;  Surgeon: Lollie Sails, MD;  Location: [facility] ENDOSCOPY;  Service: Endoscopy;  Laterality: N/A;  . echo  10/23/2012  . ESOPHAGOGASTRODUODENOSCOPY (EGD) WITH PROPOFOL N/A 08/11/2015   Procedure: ESOPHAGOGASTRODUODENOSCOPY (EGD) WITH PROPOFOL;  Surgeon: Lollie Sails, MD;  Location: [facility] ENDOSCOPY;  Service: Endoscopy;  Laterality: N/A;  . FOOT SURGERY     Dr. Paulla Dolly,  bone spurs  . skin tags    . UPPER GASTROINTESTINAL ENDOSCOPY  08/08/2015   with MAC- barretts and duodenitis    Social History  Substance Use Topics  . Smoking status: Never Smoker  . Smokeless tobacco: Never Used  . Alcohol use No     Medication list has been reviewed and updated.   Physical Exam  Constitutional: She is oriented to person, place, and time. She appears well-developed. No distress.  HENT:  Head: Normocephalic and atraumatic.  Cardiovascular: Normal rate, regular rhythm and normal heart sounds.   Pulmonary/Chest: Effort normal. No respiratory distress. She has no decreased breath sounds. She has no wheezes. She has no rhonchi.  Abdominal: There is tenderness in the suprapubic area. There is no rigidity, no guarding and no CVA tenderness.  Musculoskeletal: Normal range of motion.  Neurological: She is alert and oriented to person, place, and time.  Skin: Skin is warm and dry. No rash noted.  Fine erythema on sides of anterior neck - blanching, flat nontender  Psychiatric: She has a normal mood and affect. Her behavior is normal. Thought content normal.  Nursing note and vitals reviewed.   BP (!) 146/86   Pulse 92   Temp 97.6 F (36.4 C)   Ht _0  (1.575 m)   Wt 152 lb (68.9 kg)   SpO2 100%   BMI 27.80 kg/m   Assessment and Plan: 1. Acute cystitis without hematuria Consider Urology referral if cx negative and sx continue (pt is resistant referral today) - POC urinalysis w microscopic (non auto) - Urine culture - sulfamethoxazole-trimethoprim (BACTRIM DS,SEPTRA DS) 800-160 MG tablet; Take 1 tablet by mouth 2 (two) times daily.  Dispense: 20 tablet; Refill: 0  2. B12 deficiency Return in 5 weeks for injection - cyanocobalamin ((VITAMIN B-12)) injection 1,000 mcg; Inject 1 mL (1,000 mcg total) into the muscle once.  3. Type 2 diabetes mellitus with diabetic polyneuropathy, with long-term current use of insulin (HCC) Monitor BS twice a day Continue  Lantus 50 u and Humlog as needed   Halina Maidens, MD [facility]  11/23/2016

## 2016-11-25 LAB — URINE CULTURE

## 2016-11-26 ENCOUNTER — Other Ambulatory Visit: Payer: Self-pay | Admitting: Internal Medicine

## 2016-11-26 DIAGNOSIS — R300 Dysuria: Secondary | ICD-10-CM

## 2016-12-09 ENCOUNTER — Ambulatory Visit: Payer: Medicare Other | Admitting: Urology

## 2016-12-21 ENCOUNTER — Encounter: Payer: Self-pay | Admitting: Urology

## 2016-12-21 ENCOUNTER — Ambulatory Visit: Payer: Medicare Other | Admitting: Urology

## 2016-12-21 VITALS — BP 166/74 | HR 94 | Ht 61.0 in | Wt 158.5 lb

## 2016-12-21 DIAGNOSIS — N390 Urinary tract infection, site not specified: Secondary | ICD-10-CM | POA: Diagnosis not present

## 2016-12-21 DIAGNOSIS — N8111 Cystocele, midline: Secondary | ICD-10-CM | POA: Diagnosis not present

## 2016-12-21 DIAGNOSIS — R1031 Right lower quadrant pain: Secondary | ICD-10-CM

## 2016-12-21 DIAGNOSIS — R3915 Urgency of urination: Secondary | ICD-10-CM

## 2016-12-21 DIAGNOSIS — N952 Postmenopausal atrophic vaginitis: Secondary | ICD-10-CM

## 2016-12-21 LAB — BLADDER SCAN AMB NON-IMAGING: Scan Result: 122

## 2016-12-21 NOTE — Progress Notes (Signed)
12/21/2016 2:32 PM   [name] 02-Jun-1946 [id number]  Referring provider: [name], MD [address] [place], [place] [address]  Chief Complaint  Patient presents with  . New Patient (Initial Visit)    Recurrent uti referred by Dr. [name]    HPI: Patient is a 71 year old Caucasian female who is referred to [place] by, [name], for symptoms of recurrent urinary tract infections with negative urine cultures, she presents with her husband [name].    Patient states that she has had urinary tract infections symptoms over the last few month.  These symptoms have not improved with antibiotics.    Her symptoms were a golden colored urine, burning before and after urination, cloudy urine and a musty odor to the urine.  She states that these symptoms are getting stronger over the two months with urgency.  She is having suprapubic discomfort and a right lower quadrant pain.  She denies gross hematuria, back pain, abdominal pain or flank pain.   She has not had any recent fevers, chills, nausea or vomiting.   She does/does not have a history of nephrolithiasis, GU surgery or GU trauma.     Patient is adopted.     She is not sexually active.   She is post menopausal.   She admits to constipation and is using [name] and fiber cereal.  She has a BM every two days.    She does engage in good perineal hygiene. She does not take tub baths.   She has had had some incontinence.  She is using incontinence pads.  She is not having pain with bladder filling.    She has not had any recent imaging studies.    She is drinking eight 8 oz of water daily.    PMH: Past Medical History:  Diagnosis Date  . Adopted   . Cataract   . Diabetes mellitus 2000  . GERD (gastroesophageal reflux disease)   . Hyperlipidemia   . Hypertension   . Neuromuscular disorder ([place])    peripheral neuropathy  . 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 Sinusitis   . Tenosynovitis      Surgical History: Past Surgical History:  Procedure Laterality Date  . ABDOMINAL HYSTERECTOMY    . ACHILLES TENDON SURGERY  2006   removela of bone spur  . BUNIONECTOMY    . CATARACT EXTRACTION Bilateral 10/18/11  04/04/13  . CHOLECYSTECTOMY    . COLONOSCOPY  01/13/2011  . COLONOSCOPY WITH PROPOFOL N/A 08/11/2015   Procedure: COLONOSCOPY WITH PROPOFOL;  Surgeon: Lollie Sails, MD;  Location: [facility] ENDOSCOPY;  Service: Endoscopy;  Laterality: N/A;  . echo  10/23/2012  . ESOPHAGOGASTRODUODENOSCOPY (EGD) WITH PROPOFOL N/A 08/11/2015   Procedure: ESOPHAGOGASTRODUODENOSCOPY (EGD) WITH PROPOFOL;  Surgeon: Lollie Sails, MD;  Location: [facility] ENDOSCOPY;  Service: Endoscopy;  Laterality: N/A;  . FOOT SURGERY     Dr. Paulla Dolly, bone spurs  . GALLBLADDER SURGERY    . skin tags    . UPPER GASTROINTESTINAL ENDOSCOPY  08/08/2015   with MAC- barretts and duodenitis    Home Medications:  Allergies as of 12/21/2016      Reactions   Ciprofloxacin    Worsening symptoms of neuropathy   Omeprazole Itching, Swelling, Other (See Comments)   Proton Pump Inhibitors    Other reaction(s): Angioedema--prilosec       Medication List       Accurate as of 12/21/16  2:32 PM. Always use your most recent  med list.          aspirin EC 81 MG tablet Take 81 mg by mouth daily.   B-D UF III MINI PEN NEEDLES 31G X 5 MM Misc Generic drug:  Insulin Pen Needle CHECK BLOOD SUGAR TWO TO THREE TIMES DAILY.   CRANBERRY PO Take by mouth.   CYSTEX PO Take by mouth.   fluticasone 50 MCG/ACT nasal spray Commonly known as:  FLONASE Place 2 sprays into both nostrils daily.   glucose blood test strip Please dispense One Touch Ultra testing strips for patient to use with home device. Checking blood sugars 2-3 times a day. Dx code 250.0   insulin lispro 100 UNIT/ML KiwkPen Commonly known as:  HUMALOG KWIKPEN Inject 0.1 mLs (10 Units total) into the skin 3 (three) times daily.   Krill Oil 500 MG  Caps Take 1 capsule by mouth daily.   LANTUS SOLOSTAR 100 UNIT/ML Solostar Pen Generic drug:  Insulin Glargine INJECT 50 UNITS INTO THE SKIN ONCE DAILY.   lisinopril 10 MG tablet Commonly known as:  PRINIVIL,ZESTRIL Take 1 tablet (10 mg total) by mouth daily.   loratadine 10 MG tablet Commonly known as:  CLARITIN Take 10 mg by mouth daily.   metFORMIN 1000 MG tablet Commonly known as:  GLUCOPHAGE Take 1 tablet (1,000 mg total) by mouth 2 (two) times daily.   multivitamin tablet Take 1 tablet by mouth daily. Vitamin D3   onetouch ultrasoft lancets USE TO CHECK BLOOD SUGAR 2-3 TIMES PER DAY   OT ULTRA/FASTTK CNTRL SOLN Soln 1 drop by In Vitro route once. Please dispense controls for patient to use at home with One Touch Ultra machine. Dx code 250.0   ranitidine 150 MG tablet Commonly known as:  ZANTAC Take 1 tablet (150 mg total) by mouth 2 (two) times daily.   simvastatin 20 MG tablet Commonly known as:  ZOCOR Take 1 tablet (20 mg total) by mouth at bedtime.   sitaGLIPtin 100 MG tablet Commonly known as:  JANUVIA Take 1 tablet (100 mg total) by mouth daily.   sulfamethoxazole-trimethoprim 800-160 MG tablet Commonly known as:  BACTRIM DS,SEPTRA DS Take 1 tablet by mouth 2 (two) times daily.   traMADol 50 MG tablet Commonly known as:  ULTRAM Take 1-2 tablets (50-100 mg total) by mouth 3 (three) times daily as needed.   Vitamin D 2000 units Caps Take 2,000 Units by mouth daily.       Allergies:  Allergies  Allergen Reactions  . Ciprofloxacin     Worsening symptoms of neuropathy  . Omeprazole Itching, Swelling and Other (See Comments)  . Proton Pump Inhibitors     Other reaction(s): Angioedema--prilosec     Family History: Family History  Problem Relation Age of Onset  . Adopted: Yes    Social History:  reports that she has never smoked. She has never used smokeless tobacco. She reports that she does not drink alcohol or use  drugs.  ROS: UROLOGY Frequent Urination?: Yes Hard to postpone urination?: Yes Burning/pain with urination?: Yes Get up at night to urinate?: Yes Leakage of urine?: No Urine stream starts and stops?: No Trouble starting stream?: No Do you have to strain to urinate?: No Blood in urine?: No Urinary tract infection?: No Sexually transmitted disease?: No Injury to kidneys or bladder?: No Painful intercourse?: No Weak stream?: No Currently pregnant?: No Vaginal bleeding?: No Last menstrual period?: n  Gastrointestinal Nausea?: No Vomiting?: No Indigestion/heartburn?: Yes Diarrhea?: No Constipation?: Yes  Constitutional Fever: No  Night sweats?: No Weight loss?: No Fatigue?: Yes  Skin Skin rash/lesions?: No Itching?: Yes  Eyes Blurred vision?: No Double vision?: No  Ears/Nose/Throat Sore throat?: No Sinus problems?: No  Hematologic/Lymphatic Swollen glands?: No Easy bruising?: No  Cardiovascular Leg swelling?: No Chest pain?: No  Respiratory Cough?: No Shortness of breath?: No  Endocrine Excessive thirst?: No  Musculoskeletal Back pain?: No Joint pain?: No  Neurological Headaches?: No Dizziness?: No  Psychologic Depression?: No Anxiety?: No  Physical Exam: BP (!) 166/74   Pulse 94   Ht _0  (1.549 m)   Wt 158 lb 8 oz (71.9 kg)   BMI 29.95 kg/m   Constitutional: Well nourished. Alert and oriented, No acute distress. HEENT: [place] AT, moist mucus membranes. Trachea midline, no masses. Cardiovascular: No clubbing, cyanosis, or edema. Respiratory: Normal respiratory effort, no increased work of breathing. GI: Abdomen is soft, non tender, non distended, no abdominal masses. Liver and spleen not palpable.  No hernias appreciated.  Stool sample for occult testing is not indicated.   GU: No CVA tenderness.  No bladder fullness or masses.  Atrophic external genitalia, normal pubic hair distribution, no lesions.  Normal urethral meatus, no lesions,  no prolapse, no discharge.   No urethral masses, tenderness and/or tenderness. No bladder fullness, tenderness or masses. Pale vagina mucosa, poor estrogen effect, no discharge, no lesions, good pelvic support, Grade II cystocele is noted.  No rectocele noted.  Cervix and uterus are surgically absent.  No adnexal/parametria masses or tenderness noted.  Anus and perineum are without rashes or lesions.    Skin: No rashes, bruises or suspicious lesions. Lymph: No cervical or inguinal adenopathy. Neurologic: Grossly intact, no focal deficits, moving all 4 extremities. Psychiatric: Normal mood and affect.  Laboratory Data: Lab Results  Component Value Date   WBC 8.6 05/28/2016   HGB 15.0 05/28/2016   HCT 45.5 05/28/2016   MCV 82.1 05/28/2016   PLT 277.0 05/28/2016    Lab Results  Component Value Date   CREATININE 0.74 06/17/2016    Lab Results  Component Value Date   HGBA1C 7.7 (H) 10/12/2016    Lab Results  Component Value Date   TSH 0.61 05/28/2016       Component Value Date/Time   CHOL 134 06/17/2016 0810   HDL 34.80 (L) 06/17/2016 0810   CHOLHDL 4 06/17/2016 0810   VLDL 38.8 06/17/2016 0810   LDLCALC 60 06/17/2016 0810    Lab Results  Component Value Date   AST 23 06/17/2016   Lab Results  Component Value Date   ALT 24 06/17/2016     Urinalysis 6-10 WBC's.  See EPIC.    Pertinent Imaging: Results for ZYAN, MIRKIN (MRN [id number]) as of 12/21/2016 14:04  Ref. Range 12/21/2016 13:50  Scan Result Unknown 122    Assessment & Plan:    1. Urgency  - patient has noticed an increase in urgency over the last two months  - patient will be schedule for a CT scan to evaluate for an ureteral stone that may be responsible for this symptom  - if no etiology is found for her symtoms, will reasses   - Urinalysis, Complete  - BLADDER SCAN AMB NON-IMAGING  2. Cystocele  - patient is not having any SUI  - manage conservately at this time  3. Vaginal atrophy  - I  explained to the patient that when women go through menopause and her estrogen levels are severely diminished, the normal vaginal mucosa will change.  This is due to an increase of the vaginal canal's pH. Because of this, the vaginal canal may become irritated and cause symptoms of an UTI   - In some studies, the use of vaginal estrogen cream has been demonstrated to reduce symptoms of irritations    - Patient was given a sample of vaginal estrogen cream (Premarin/Estrace) and instructed to apply 0.65m (pea-sized amount)  just inside the vaginal introitus with a finger-tip every night for two weeks and then Monday, Wednesday and Friday nights.  I explained to the patient that vaginally administered estrogen, which causes only a slight increase in the blood estrogen levels, have fewer contraindications and adverse systemic effects that oral HT.  4. Right lower quadrant pain  - obtain CT scan    Return for RTC for CT report.  These notes generated with voice recognition software. I apologize for typographical errors.  SZara [facility] [address] [place] [phone]

## 2016-12-22 LAB — URINALYSIS, COMPLETE
BILIRUBIN UA: NEGATIVE
Glucose, UA: NEGATIVE
Ketones, UA: NEGATIVE
NITRITE UA: NEGATIVE
PH UA: 5.5 (ref 5.0–7.5)
PROTEIN UA: NEGATIVE
RBC UA: NEGATIVE
Specific Gravity, UA: 1.025 (ref 1.005–1.030)
UUROB: 0.2 mg/dL (ref 0.2–1.0)

## 2016-12-22 LAB — MICROSCOPIC EXAMINATION
Epithelial Cells (non renal): >10 /hpf — AB (ref 0–10)
RBC MICROSCOPIC, UA: NONE SEEN /HPF (ref 0–?)

## 2016-12-30 ENCOUNTER — Ambulatory Visit
Admission: RE | Admit: 2016-12-30 | Discharge: 2016-12-30 | Disposition: A | Discharge status: Home / Self Care | Payer: Medicare Other | Source: Ambulatory Visit | Attending: Urology | Admitting: Urology

## 2016-12-30 DIAGNOSIS — K760 Fatty (change of) liver, not elsewhere classified: Secondary | ICD-10-CM | POA: Diagnosis not present

## 2016-12-30 DIAGNOSIS — R1031 Right lower quadrant pain: Secondary | ICD-10-CM | POA: Diagnosis not present

## 2016-12-30 DIAGNOSIS — I700 Atherosclerosis of aorta: Secondary | ICD-10-CM | POA: Insufficient documentation

## 2017-01-04 ENCOUNTER — Ambulatory Visit (INDEPENDENT_AMBULATORY_CARE_PROVIDER_SITE_OTHER): Payer: Medicare Other

## 2017-01-04 DIAGNOSIS — E538 Deficiency of other specified B group vitamins: Secondary | ICD-10-CM

## 2017-01-04 MED ORDER — CYANOCOBALAMIN 1000 MCG/ML IJ SOLN
1000.0000 ug | Freq: Once | INTRAMUSCULAR | Status: AC
  Administered 2017-01-04: 1000 ug via INTRAMUSCULAR

## 2017-01-05 NOTE — Progress Notes (Signed)
01/06/2017 9:58 AM   [name] [birth year] [id number]  Referring provider: [name], MD [address] [place], [place] [address]  Chief Complaint  Patient presents with  . Results    CT    HPI: 71 yo WF who presents today to discuss her non contrast CT results performed to evaluate for a possible ureteral stone.  Background history Patient is a 71 year old Caucasian female who is referred to [place] by, [name], for symptoms of recurrent urinary tract infections with negative urine cultures, she presents with her husband [name].  Patient states that she has had urinary tract infections symptoms over the last few month.  These symptoms have not improved with antibiotics.  Her symptoms were a golden colored urine, burning before and after urination, cloudy urine and a musty odor to the urine.  She states that these symptoms are getting stronger over the two months with urgency.  She is having suprapubic discomfort and a right lower quadrant pain.  She denies gross hematuria, back pain, abdominal pain or flank pain.   She has not had any recent fevers, chills, nausea or vomiting.   She does/does not have a history of nephrolithiasis, GU surgery or GU trauma.   Patient is adopted.   She is not sexually active.   She is post menopausal.   She admits to constipation and is using [name] and fiber cereal.  She has a BM every two days.  She does engage in good perineal hygiene. She does not take tub baths.  She has had some incontinence.  She is using incontinence pads.  She is not having pain with bladder filling.   She has not had any recent imaging studies.  She is drinking eight 8 oz of water daily.    Non contrast CT scan noted no evidence of urolithiasis, hydronephrosis, or other acute findings.  Mild hepatic steatosis.  Aortic atherosclerosis.  Moderate colonic stool noted.  I have independently reviewed the films.  Today, she is experiencing urgency.   She is not using her  vaginal estrogen cream three nights weekly.  She is still experiencing right hip pain with spikes in her BS at the same time.  She has not had nausea, vomiting, fevers or chills.  Her CATH UA today is unremarkable.    She is very concerned as to why her Vitamin B levels were low.  She is wanting an answer as to why this happened.  I did express that I do not think the low vitamin B levels and her urinary symptoms are related.    PMH: Past Medical History:  Diagnosis Date  . Adopted   . Cataract   . Diabetes mellitus 2000  . GERD (gastroesophageal reflux disease)   . Hyperlipidemia   . Hypertension   . Neuromuscular disorder ([place])    peripheral neuropathy  . 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 Sinusitis   . Tenosynovitis     Surgical History: Past Surgical History:  Procedure Laterality Date  . ABDOMINAL HYSTERECTOMY    . ACHILLES TENDON SURGERY  2006   removela of bone spur  . BUNIONECTOMY    . CATARACT EXTRACTION Bilateral 10/18/11  04/04/13  . CHOLECYSTECTOMY    . COLONOSCOPY  01/13/2011  . COLONOSCOPY WITH PROPOFOL N/A 08/11/2015   Procedure: COLONOSCOPY WITH PROPOFOL;  Surgeon: Lollie Sails, MD;  Location: [facility] ENDOSCOPY;  Service: Endoscopy;  Laterality: N/A;  . echo  10/23/2012  . ESOPHAGOGASTRODUODENOSCOPY (EGD)  WITH PROPOFOL N/A 08/11/2015   Procedure: ESOPHAGOGASTRODUODENOSCOPY (EGD) WITH PROPOFOL;  Surgeon: Lollie Sails, MD;  Location: [facility] ENDOSCOPY;  Service: Endoscopy;  Laterality: N/A;  . FOOT SURGERY     Dr. Paulla Dolly, bone spurs  . GALLBLADDER SURGERY    . skin tags    . UPPER GASTROINTESTINAL ENDOSCOPY  08/08/2015   with MAC- barretts and duodenitis    Home Medications:  Allergies as of 01/06/2017      Reactions   Ciprofloxacin    Worsening symptoms of neuropathy   Omeprazole Itching, Swelling, Other (See Comments)   Proton Pump Inhibitors    Other reaction(s): Angioedema--prilosec       Medication List       Accurate as of 01/06/17  9:58  AM. Always use your most recent med list.          aspirin EC 81 MG tablet Take 81 mg by mouth daily.   B-D UF III MINI PEN NEEDLES 31G X 5 MM Misc Generic drug:  Insulin Pen Needle CHECK BLOOD SUGAR TWO TO THREE TIMES DAILY.   CRANBERRY PO Take by mouth.   CYSTEX PO Take by mouth.   fluticasone 50 MCG/ACT nasal spray Commonly known as:  FLONASE Place 2 sprays into both nostrils daily.   glucose blood test strip Please dispense One Touch Ultra testing strips for patient to use with home device. Checking blood sugars 2-3 times a day. Dx code 250.0   insulin lispro 100 UNIT/ML KiwkPen Commonly known as:  HUMALOG KWIKPEN Inject 0.1 mLs (10 Units total) into the skin 3 (three) times daily.   Krill Oil 500 MG Caps Take 1 capsule by mouth daily.   LANTUS SOLOSTAR 100 UNIT/ML Solostar Pen Generic drug:  Insulin Glargine INJECT 50 UNITS INTO THE SKIN ONCE DAILY.   lisinopril 10 MG tablet Commonly known as:  PRINIVIL,ZESTRIL Take 1 tablet (10 mg total) by mouth daily.   loratadine 10 MG tablet Commonly known as:  CLARITIN Take 10 mg by mouth daily.   metFORMIN 1000 MG tablet Commonly known as:  GLUCOPHAGE Take 1 tablet (1,000 mg total) by mouth 2 (two) times daily.   multivitamin tablet Take 1 tablet by mouth daily. Vitamin D3   onetouch ultrasoft lancets USE TO CHECK BLOOD SUGAR 2-3 TIMES PER DAY   OT ULTRA/FASTTK CNTRL SOLN Soln 1 drop by In Vitro route once. Please dispense controls for patient to use at home with One Touch Ultra machine. Dx code 250.0   ranitidine 150 MG tablet Commonly known as:  ZANTAC Take 1 tablet (150 mg total) by mouth 2 (two) times daily.   simvastatin 20 MG tablet Commonly known as:  ZOCOR Take 1 tablet (20 mg total) by mouth at bedtime.   sitaGLIPtin 100 MG tablet Commonly known as:  JANUVIA Take 1 tablet (100 mg total) by mouth daily.   sulfamethoxazole-trimethoprim 800-160 MG tablet Commonly known as:  BACTRIM DS,SEPTRA  DS Take 1 tablet by mouth 2 (two) times daily.   traMADol 50 MG tablet Commonly known as:  ULTRAM Take 1-2 tablets (50-100 mg total) by mouth 3 (three) times daily as needed.   Vitamin D 2000 units Caps Take 2,000 Units by mouth daily.       Allergies:  Allergies  Allergen Reactions  . Ciprofloxacin     Worsening symptoms of neuropathy  . Omeprazole Itching, Swelling and Other (See Comments)  . Proton Pump Inhibitors     Other reaction(s): Angioedema--prilosec     Family  History: Family History  Problem Relation Age of Onset  . Adopted: Yes    Social History:  reports that she has never smoked. She has never used smokeless tobacco. She reports that she does not drink alcohol or use drugs.  ROS: UROLOGY Frequent Urination?: No Hard to postpone urination?: Yes Burning/pain with urination?: No Get up at night to urinate?: No Leakage of urine?: No Urine stream starts and stops?: No Trouble starting stream?: No Do you have to strain to urinate?: No Blood in urine?: No Urinary tract infection?: No Sexually transmitted disease?: No Injury to kidneys or bladder?: No Painful intercourse?: No Weak stream?: No Currently pregnant?: No Vaginal bleeding?: No Last menstrual period?: n  Gastrointestinal Nausea?: No Vomiting?: No Indigestion/heartburn?: No Diarrhea?: No Constipation?: No  Constitutional Fever: No Night sweats?: No Weight loss?: No Fatigue?: No  Skin Skin rash/lesions?: No Itching?: No  Eyes Blurred vision?: No Double vision?: No  Ears/Nose/Throat Sore throat?: No Sinus problems?: No  Hematologic/Lymphatic Swollen glands?: No Easy bruising?: No  Cardiovascular Leg swelling?: No Chest pain?: No  Respiratory Cough?: No Shortness of breath?: No  Endocrine Excessive thirst?: No  Musculoskeletal Back pain?: No Joint pain?: No  Neurological Headaches?: No Dizziness?: No  Psychologic Depression?: No Anxiety?: No  Physical  Exam: BP (!) 146/76   Pulse 87   Ht _0  (1.549 m)   Wt 154 lb 12.8 oz (70.2 kg)   BMI 29.25 kg/m   Constitutional: Well nourished. Alert and oriented, No acute distress. HEENT: [place] AT, moist mucus membranes. Trachea midline, no masses. Cardiovascular: No clubbing, cyanosis, or edema. Respiratory: Normal respiratory effort, no increased work of breathing. Skin: No rashes, bruises or suspicious lesions. Lymph: No cervical or inguinal adenopathy. Neurologic: Grossly intact, no focal deficits, moving all 4 extremities. Psychiatric: Normal mood and affect.  Laboratory Data: Lab Results  Component Value Date   WBC 8.6 05/28/2016   HGB 15.0 05/28/2016   HCT 45.5 05/28/2016   MCV 82.1 05/28/2016   PLT 277.0 05/28/2016    Lab Results  Component Value Date   CREATININE 0.74 06/17/2016    Lab Results  Component Value Date   HGBA1C 7.7 (H) 10/12/2016    Lab Results  Component Value Date   TSH 0.61 05/28/2016       Component Value Date/Time   CHOL 134 06/17/2016 0810   HDL 34.80 (L) 06/17/2016 0810   CHOLHDL 4 06/17/2016 0810   VLDL 38.8 06/17/2016 0810   LDLCALC 60 06/17/2016 0810    Lab Results  Component Value Date   AST 23 06/17/2016   Lab Results  Component Value Date   ALT 24 06/17/2016     Urinalysis Unremarkable.  See EPIC.    Pertinent Imaging: CLINICAL DATA:  Right lower quadrant pain for 2 months. Recurrent urinary tract infections.  EXAM: CT ABDOMEN AND PELVIS WITHOUT CONTRAST  TECHNIQUE: Multidetector CT imaging of the abdomen and pelvis was performed following the standard protocol without IV contrast.  COMPARISON:  None.  FINDINGS: Lower chest: No acute findings.  Hepatobiliary: No masses visualized on this unenhanced exam. Mild diffuse hepatic steatosis. Prior cholecystectomy noted. No evidence of biliary dilatation.  Pancreas: No mass or inflammatory process visualized on this unenhanced exam.  Spleen:  Within normal  limits in size.  Adrenals/Urinary tract: No evidence of urolithiasis or hydronephrosis. Empty urinary bladder.  Stomach/Bowel: No evidence of obstruction, inflammatory process, or abnormal fluid collections. Normal appendix visualized. Moderate colonic stool noted.  Vascular/Lymphatic: No pathologically enlarged  lymph nodes identified. No evidence of abdominal aortic aneurysm. Aortic atherosclerosis.  Reproductive: Prior hysterectomy noted. Adnexal regions are unremarkable in appearance.  Other:  None.  Musculoskeletal: No suspicious bone lesions identified. Advanced lumbar degenerative spondylosis noted.  IMPRESSION: No evidence of urolithiasis, hydronephrosis, or other acute findings.  Mild hepatic steatosis.  Aortic atherosclerosis.   Electronically Signed   By: Earle Gell M.D.   On: 12/30/2016 12:05  Assessment & Plan:    1. Urgency  - patient has noticed an increase in urgency over the last two months  - no ureteral stones identified on CT scan  - Urinalysis, Complete - CATH specimen is unremarkable  - will send for culture as patient is fearful she may have an infection  - discussed starting medications, refer to PT or PTNS for her urgency, she declines at this time 2. Cystocele  - patient is not having any SUI  - manage conservatively at this time  3. Vaginal atrophy  - start the estrogen cream three nights weekly  - RTC in 3 months for exam  4. Right lower quadrant pain  - no ureteral stone seen on CT  - follow up with Dr. [name] - may be musculoskeletal in nature as she has advanced lumbar degenerative spondylosis seen on CT    Return in about 3 months (around 04/05/2017) for exam and OAB questionnaire.  These notes generated with voice recognition software. I apologize for typographical errors.  Zara Council, [facility] [address], [place], [place] [address] [phone]

## 2017-01-06 ENCOUNTER — Ambulatory Visit (INDEPENDENT_AMBULATORY_CARE_PROVIDER_SITE_OTHER): Payer: Medicare Other | Admitting: Urology

## 2017-01-06 ENCOUNTER — Encounter: Payer: Self-pay | Admitting: Urology

## 2017-01-06 VITALS — BP 146/76 | HR 87 | Ht 61.0 in | Wt 154.8 lb

## 2017-01-06 DIAGNOSIS — N8111 Cystocele, midline: Secondary | ICD-10-CM

## 2017-01-06 DIAGNOSIS — R1031 Right lower quadrant pain: Secondary | ICD-10-CM

## 2017-01-06 DIAGNOSIS — R3915 Urgency of urination: Secondary | ICD-10-CM

## 2017-01-06 DIAGNOSIS — N952 Postmenopausal atrophic vaginitis: Secondary | ICD-10-CM | POA: Diagnosis not present

## 2017-01-06 DIAGNOSIS — R109 Unspecified abdominal pain: Secondary | ICD-10-CM | POA: Diagnosis not present

## 2017-01-06 LAB — MICROSCOPIC EXAMINATION: Bacteria, UA: NONE SEEN

## 2017-01-06 LAB — URINALYSIS, COMPLETE
BILIRUBIN UA: NEGATIVE
Glucose, UA: NEGATIVE
KETONES UA: NEGATIVE
LEUKOCYTES UA: NEGATIVE
NITRITE UA: NEGATIVE
PH UA: 5.5 (ref 5.0–7.5)
Protein, UA: NEGATIVE
RBC UA: NEGATIVE
UUROB: 0.2 mg/dL (ref 0.2–1.0)

## 2017-01-06 NOTE — Progress Notes (Signed)
In and Out Catheterization  Patient is present today for a I & O catheterization due to needing a clean catch specimen. Patient was cleaned and prepped in a sterile fashion with betadine and Lidocaine 2% jelly was instilled into the urethra.  A 14FR cath was inserted no complications were noted , 77ml of urine return was noted, urine was yellow in color. A clean urine sample was collected for Urinalysis. Bladder was drained and catheter was removed with out difficulty.    Preformed by: Lyndee Hensen CMA

## 2017-01-09 LAB — CULTURE, URINE COMPREHENSIVE

## 2017-01-10 ENCOUNTER — Telehealth: Payer: Self-pay

## 2017-01-10 NOTE — Telephone Encounter (Signed)
-----   Message from Nori Riis, PA-C sent at 01/09/2017 10:40 AM EST ----- Please notify the patient that her urine culture was negative.

## 2017-01-10 NOTE — Telephone Encounter (Signed)
Spoke with pt in reference to -ucx. Pt voiced understanding.  

## 2017-01-14 ENCOUNTER — Telehealth: Payer: Self-pay

## 2017-01-14 NOTE — Telephone Encounter (Signed)
Pt husband called stating pt is having diarrhea, and unable to take in food X 2 days. Tried Imodium and it isn't helping.. Told pt if symptoms don't get better by in the morning she needs to see urgent care.

## 2017-02-09 ENCOUNTER — Ambulatory Visit: Payer: Medicare Other | Admitting: Internal Medicine

## 2017-02-10 ENCOUNTER — Encounter: Payer: Self-pay | Admitting: Internal Medicine

## 2017-02-10 ENCOUNTER — Ambulatory Visit (INDEPENDENT_AMBULATORY_CARE_PROVIDER_SITE_OTHER): Payer: Medicare Other | Admitting: Internal Medicine

## 2017-02-10 VITALS — BP 118/74 | HR 86 | Ht 62.0 in | Wt 154.0 lb

## 2017-02-10 DIAGNOSIS — I10 Essential (primary) hypertension: Secondary | ICD-10-CM | POA: Diagnosis not present

## 2017-02-10 DIAGNOSIS — E1142 Type 2 diabetes mellitus with diabetic polyneuropathy: Secondary | ICD-10-CM

## 2017-02-10 DIAGNOSIS — E538 Deficiency of other specified B group vitamins: Secondary | ICD-10-CM | POA: Diagnosis not present

## 2017-02-10 DIAGNOSIS — Z794 Long term (current) use of insulin: Secondary | ICD-10-CM | POA: Diagnosis not present

## 2017-02-10 DIAGNOSIS — M792 Neuralgia and neuritis, unspecified: Secondary | ICD-10-CM

## 2017-02-10 MED ORDER — TRAMADOL HCL 50 MG PO TABS: 50.0000 mg | ORAL_TABLET | Freq: Three times a day (TID) | ORAL | 2 refills | Status: DC | PRN

## 2017-02-10 MED ORDER — CYANOCOBALAMIN 1000 MCG/ML IJ SOLN
1000.0000 ug | Freq: Once | INTRAMUSCULAR | Status: AC
  Administered 2017-02-10: 1000 ug via INTRAMUSCULAR

## 2017-02-10 NOTE — Progress Notes (Signed)
Date:  02/10/2017   Name:  Anita Mercer   DOB:  Nov 03, 1946   MRN:  [id number]   Chief Complaint: Diabetes (Yesterday- Woke up with 314 BS, did 4 units of Hemolog and rechecked 2 hours later and it was 240.) and Vaginitis (Pt still complaining of urgency, and burning w/ dark urine. Also has itcing. Concerned of Yeast Infection.) Diabetes  She presents for her follow-up diabetic visit. She has type 2 diabetes mellitus. Hypoglycemia symptoms include nervousness/anxiousness. Pertinent negatives for hypoglycemia include no headaches or tremors. Pertinent negatives for diabetes include no chest pain, no fatigue, no polydipsia and no polyuria. Hypoglycemia complications include required assistance (glucose tablets for BS 80). There are no diabetic complications. She is currently taking insulin at bedtime. Her weight is stable. She monitors blood glucose at home 1-2 x per day. Her home blood glucose trend is fluctuating dramatically.  Vaginal Discharge  The patient's pertinent negatives include no vaginal discharge (no itching). Associated symptoms include urgency. Pertinent negatives include no abdominal pain, dysuria, fever, headaches or hematuria.  Having to take Humalog SS more often.  Taking Lantus at 10 PM.  Dose of Lantus has not been changed for year.  She is very worried as she has had Bs up to 300 and some down to 80 at which time she was sx. She was seen by Urology.  Instructed to use estrace to the urethral meatus nightly.  She started this a few weeks ago but is not using a large enough amount.  She is concerned that she has "yeast' and performed a home test on her saliva.  It was "positive" so she started a pro-biotic. Fatigue - on B12 injections every 4-6 weeks.  Last done 6 weeks ago.  Due for levels today.  Not much improved but sleep is disrupted because she is staying up late to check her BS.  Neuropathic pain - using Tramadol for pain with good results.  Lab Results  Component Value  Date   HGBA1C 7.7 (H) 10/12/2016     Review of Systems  Constitutional: Negative for appetite change, fatigue, fever and unexpected weight change.  HENT: Negative for tinnitus and trouble swallowing.   Eyes: Negative for visual disturbance.  Respiratory: Negative for cough, chest tightness and shortness of breath.   Cardiovascular: Negative for chest pain, palpitations and leg swelling.  Gastrointestinal: Negative for abdominal pain.  Endocrine: Negative for polydipsia and polyuria.  Genitourinary: Positive for urgency. Negative for dysuria, hematuria, vaginal bleeding and vaginal discharge (no itching).  Musculoskeletal: Negative for arthralgias.       Neuropathic pain  Neurological: Negative for tremors, numbness and headaches.  Psychiatric/Behavioral: Negative for dysphoric mood. The patient is nervous/anxious.     Patient Active Problem List   Diagnosis Date Noted  . B12 deficiency 07/12/2016  . Gastroesophageal reflux disease with esophagitis 07/12/2016  . Osteopenia determined by x-ray 07/09/2016  . De Quervain's tenosynovitis, right 06/17/2016  . Chronic fatigue 06/16/2016  . Hearing loss 05/28/2014  . Benign essential tremor 05/10/2013  . Allergic rhinitis 03/15/2013  . Neuropathic pain 09/11/2012  . Paroxysmal nerve pain 09/11/2012  . Hyperlipidemia associated with type 2 diabetes mellitus ([place]) 10/01/2011  . Essential hypertension 10/01/2011  . Type 2 diabetes mellitus with diabetic polyneuropathy, with long-term current use of insulin ([place]) 10/01/2011    Prior to Admission medications   Medication Sig Start Date End Date Taking? Authorizing Provider  aspirin EC 81 MG tablet Take 81 mg by mouth daily.  Yes Historical Provider, MD  B-D UF III MINI PEN NEEDLES 31G X 5 MM MISC CHECK BLOOD SUGAR TWO TO THREE TIMES DAILY. 12/11/15  Yes Jackolyn Confer, MD  Blood Glucose Calibration (OT ULTRA/FASTTK CNTRL SOLN) SOLN 1 drop by In Vitro route once. Please dispense  controls for patient to use at home with One Touch Ultra machine. Dx code 250.0 03/15/13  Yes Jackolyn Confer, MD  Cholecalciferol (VITAMIN D) 2000 units CAPS Take 2,000 Units by mouth daily.   Yes Historical Provider, MD  CRANBERRY PO Take by mouth.   Yes Historical Provider, MD  estradiol (ESTRACE) 0.1 MG/GM vaginal cream Place 1 Applicatorful vaginally at bedtime.   Yes Historical Provider, MD  fluticasone (FLONASE) 50 MCG/ACT nasal spray Place 2 sprays into both nostrils daily. 02/21/14  Yes Jackolyn Confer, MD  glucose blood test strip Please dispense One Touch Ultra testing strips for patient to use with home device. Checking blood sugars 2-3 times a day. Dx code 250.0 12/10/15  Yes Jackolyn Confer, MD  insulin lispro (HUMALOG KWIKPEN) 100 UNIT/ML KiwkPen Inject 0.1 mLs (10 Units total) into the skin 3 (three) times daily. Patient taking differently: Inject 10 Units into the skin as needed. Patient states only uses for emergency if numbers are over 250 10/12/16  Yes [name], MD  Krill Oil 500 MG CAPS Take 1 capsule by mouth daily.   Yes Historical Provider, MD  Lactobacillus (FLORAJEN ACIDOPHILUS PO) Take 1 capsule by mouth daily.   Yes Historical Provider, MD  Lancets (ONETOUCH ULTRASOFT) lancets USE TO CHECK BLOOD SUGAR 2-3 TIMES PER DAY 06/06/15  Yes Jackolyn Confer, MD  LANTUS SOLOSTAR 100 UNIT/ML Solostar Pen INJECT 50 UNITS INTO THE SKIN ONCE DAILY. 10/28/16  Yes [name], MD  lisinopril (PRINIVIL,ZESTRIL) 10 MG tablet Take 1 tablet (10 mg total) by mouth daily. 10/12/16  Yes [name], MD  loratadine (CLARITIN) 10 MG tablet Take 10 mg by mouth daily.   Yes Historical Provider, MD  metFORMIN (GLUCOPHAGE) 1000 MG tablet Take 1 tablet (1,000 mg total) by mouth 2 (two) times daily. 10/12/16  Yes [name], MD  Multiple Vitamin (MULTIVITAMIN) tablet Take 1 tablet by mouth daily. Vitamin D3   Yes Historical Provider, MD  ranitidine (ZANTAC) 150 MG tablet Take 1  tablet (150 mg total) by mouth 2 (two) times daily. 05/03/16  Yes Jackolyn Confer, MD  simvastatin (ZOCOR) 20 MG tablet Take 1 tablet (20 mg total) by mouth at bedtime. 10/12/16  Yes [name], MD  sitaGLIPtin (JANUVIA) 100 MG tablet Take 1 tablet (100 mg total) by mouth daily. 10/12/16  Yes [name], MD  traMADol (ULTRAM) 50 MG tablet Take 1-2 tablets (50-100 mg total) by mouth 3 (three) times daily as needed. 10/12/16  Yes [name], MD    Allergies  Allergen Reactions  . Ciprofloxacin     Worsening symptoms of neuropathy  . Omeprazole Itching, Swelling and Other (See Comments)  . Proton Pump Inhibitors     Other reaction(s): Angioedema--prilosec     Past Surgical History:  Procedure Laterality Date  . ABDOMINAL HYSTERECTOMY    . ACHILLES TENDON SURGERY  2006   removela of bone spur  . BUNIONECTOMY    . CATARACT EXTRACTION Bilateral 10/18/11  04/04/13  . CHOLECYSTECTOMY    . COLONOSCOPY  01/13/2011  . COLONOSCOPY WITH PROPOFOL N/A 08/11/2015   Procedure: COLONOSCOPY WITH PROPOFOL;  Surgeon: Lollie Sails, MD;  Location: [facility] ENDOSCOPY;  Service: Endoscopy;  Laterality: N/A;  . echo  10/23/2012  . ESOPHAGOGASTRODUODENOSCOPY (EGD) WITH PROPOFOL N/A 08/11/2015   Procedure: ESOPHAGOGASTRODUODENOSCOPY (EGD) WITH PROPOFOL;  Surgeon: Lollie Sails, MD;  [facility] END[facility]COPY;  Service: Endoscopy;  Laterality: N/A;  . FOOT SURGERY     Dr. Paulla Dolly, bone spurs  . GALLBLADDER SURGERY    . skin tags    . UPPER GASTROINTESTINAL ENDOSCOPY  08/08/2015   with MAC- barretts and duodenitis    Social History  Substance Use Topics  . Smoking status: Never Smoker  . Smokeless tobacco: Never Used  . Alcohol use No     Medication list has been reviewed and updated.   Physical Exam  Constitutional: She is oriented to person, place, and time. She appears well-developed. No distress.  HENT:  Head: Normocephalic and atraumatic.  Neck: Normal range of motion.    Cardiovascular: Normal rate, regular rhythm and normal heart sounds.   Pulmonary/Chest: Effort normal and breath sounds normal. No respiratory distress. She has no wheezes.  Musculoskeletal: Normal range of motion. She exhibits no edema or tenderness.  Neurological: She is alert and oriented to person, place, and time.  Skin: Skin is warm and dry. No rash noted.  Psychiatric: She has a normal mood and affect. Her behavior is normal. Thought content normal.  Nursing note and vitals reviewed.   BP 118/74 (BP Location: Left Arm, Patient Position: Sitting, Cuff Size: Normal)   Pulse 86   Ht 5' 2"  (1.575 m)   Wt 154 lb (69.9 kg)   SpO2 100%   BMI 28.17 kg/m   Assessment and Plan: 1. Type 2 diabetes mellitus with diabetic polyneuropathy, with long-term current use of insulin (HCC) Dose Lantus in the AM Take SSI Humalog before supper Mail me a copy of one month of readings - Hemoglobin A1c  2. Essential hypertension controlled  3. B12 deficiency May need to increase the dosing interval - Vitamin B12 - cyanocobalamin ((VITAMIN B-12)) injection 1,000 mcg; Inject 1 mL (1,000 mcg total) into the muscle once.  4. Neuropathic pain Continue Tramadol   Meds ordered this encounter  Medications  . traMADol (ULTRAM) 50 MG tablet    Sig: Take 1-2 tablets (50-100 mg total) by mouth 3 (three) times daily as needed.    Dispense:  180 tablet    Refill:  2  . cyanocobalamin ((VITAMIN B-12)) injection 1,000 mcg    Halina Maidens, MD [facility]  02/10/2017

## 2017-02-10 NOTE — Patient Instructions (Signed)
Change Lantus dose to breakfast - 50 units  Check BS before supper.  Take Humulog immediately if needed.  Continue to check BS fasting in the morning.

## 2017-02-11 ENCOUNTER — Other Ambulatory Visit: Payer: Self-pay | Admitting: Internal Medicine

## 2017-02-11 LAB — HEMOGLOBIN A1C
ESTIMATED AVERAGE GLUCOSE: 189 mg/dL
Hgb A1c MFr Bld: 8.2 % — ABNORMAL HIGH (ref 4.8–5.6)

## 2017-02-11 LAB — VITAMIN B12: VITAMIN B 12: 397 pg/mL (ref 232–1245)

## 2017-03-09 ENCOUNTER — Telehealth: Payer: Self-pay | Admitting: Internal Medicine

## 2017-03-09 NOTE — Telephone Encounter (Signed)
Please call patient and instruct her to increase Lantus to 60 units once a day.  She should also increase the Humalog sliding scale up so that she takes 4 units for BS 200- 250, 6 units for BS 251-300, 8 units for BS 301 - 350 and 10 units for BS >351.  She is to take the Humalog before she eats supper - not after.

## 2017-03-10 NOTE — Telephone Encounter (Signed)
Spoke to patient- typed out Dr. Army Melia instructions for patient to understand better. Will give to pt at nurse visit for B12 shot tomorrow morning per patient preference.

## 2017-03-11 ENCOUNTER — Ambulatory Visit (INDEPENDENT_AMBULATORY_CARE_PROVIDER_SITE_OTHER): Payer: Medicare Other

## 2017-03-11 DIAGNOSIS — E538 Deficiency of other specified B group vitamins: Secondary | ICD-10-CM | POA: Diagnosis not present

## 2017-03-11 MED ORDER — CYANOCOBALAMIN 1000 MCG/ML IJ SOLN
1000.0000 ug | Freq: Once | INTRAMUSCULAR | Status: AC
  Administered 2017-03-11: 1000 ug via INTRAMUSCULAR

## 2017-03-21 ENCOUNTER — Encounter: Payer: Self-pay | Admitting: Internal Medicine

## 2017-03-21 ENCOUNTER — Telehealth: Payer: Self-pay

## 2017-03-21 NOTE — Telephone Encounter (Signed)
Spoke to pt regarding BS letter she dropped off in office. Told pt the goal for fasting BS is 150. Dr. Army Melia said her BS are slightly better than they were before. We moved pt Follow up appt up a month to June. Continue same medications and the new change for now. We will see her at her follow up visit to discuss.

## 2017-03-29 ENCOUNTER — Other Ambulatory Visit: Payer: Self-pay | Admitting: Internal Medicine

## 2017-04-04 NOTE — Progress Notes (Signed)
04/05/2017 9:04 AM   Anita Mercer August 29, 1946 [id number]  Referring provider: [name], MD [address] [place], [place] [address]  Chief Complaint  Patient presents with  . Follow-up    3 month f/u  urgency/RLQ Abd pain/vaginal atrophy    HPI: 71 yo WF who presents today to for a three month follow up for vaginal atrophy, cystocele and urge incontinence.  Background history Patient is a 71 year old Caucasian female who is referred to [place] by, Anita Mercer, for symptoms of recurrent urinary tract infections with negative urine cultures, she presents with her husband Anita Mercer.  Patient states that she has had urinary tract infections symptoms over the last few month.  These symptoms have not improved with antibiotics.  Her symptoms were a golden colored urine, burning before and after urination, cloudy urine and a musty odor to the urine.  She states that these symptoms are getting stronger over the two months with urgency.  She is having suprapubic discomfort and a right lower quadrant pain.  She denies gross hematuria, back pain, abdominal pain or flank pain.   She has not had any recent fevers, chills, nausea or vomiting.   She does/does not have a history of nephrolithiasis, GU surgery or GU trauma.   Patient is adopted.   She is not sexually active.   She is post menopausal.   She admits to constipation and is using [name] and fiber cereal.  She has a BM every two days.  She does engage in good perineal hygiene. She does not take tub baths.  She has had some incontinence.  She is using incontinence pads.  She is not having pain with bladder filling.   She has not had any recent imaging studies.  She is drinking eight 8 oz of water daily.   Non contrast CT scan noted no evidence of urolithiasis, hydronephrosis, or other acute findings.  Mild hepatic steatosis.  Aortic atherosclerosis.  Moderate colonic stool noted.    Today, the patient has been experiencing urgency x 0-3,  frequency x 4-7, not restricting fluids to avoid visits to the restroom, is engaging in toilet mapping, incontinence x 0-3 and nocturia x 0-3.      PMH: Past Medical History:  Diagnosis Date  . Adopted   . Cataract   . Diabetes mellitus 2000  . GERD (gastroesophageal reflux disease)   . Hyperlipidemia   . Hypertension   . Neuromuscular disorder ([place])    peripheral neuropathy  . [facility] spotted fever)   . Sinusitis   . Tenosynovitis     Surgical History: Past Surgical History:  Procedure Laterality Date  . ABDOMINAL HYSTERECTOMY    . ACHILLES TENDON SURGERY  2006   removela of bone spur  . BUNIONECTOMY    . CATARACT EXTRACTION Bilateral 10/18/11  04/04/13  . CHOLECYSTECTOMY    . COLONOSCOPY  01/13/2011  . COLONOSCOPY WITH PROPOFOL N/A 08/11/2015   Procedure: COLONOSCOPY WITH PROPOFOL;  Surgeon: Anita Sails, MD;  Location: [facility] ENDOSCOPY;  Service: Endoscopy;  Laterality: N/A;  . echo  10/23/2012  . ESOPHAGOGASTRODUODENOSCOPY (EGD) WITH PROPOFOL N/A 08/11/2015   Procedure: ESOPHAGOGASTRODUODENOSCOPY (EGD) WITH PROPOFOL;  Surgeon: Anita Sails, MD;  Location: [facility] ENDOSCOPY;  Service: Endoscopy;  Laterality: N/A;  . FOOT SURGERY     Dr. Paulla Mercer, bone spurs  . GALLBLADDER SURGERY    . skin tags    . UPPER GASTROINTESTINAL ENDOSCOPY  08/08/2015   with MAC- barretts and  duodenitis    Home Medications:  Allergies as of 04/05/2017      Reactions   Ciprofloxacin    Worsening symptoms of neuropathy   Omeprazole Itching, Swelling, Other (See Comments)   Proton Pump Inhibitors    Other reaction(s): Angioedema--prilosec       Medication List       Accurate as of 04/05/17  9:04 AM. Always use your most recent med list.          aspirin EC 81 MG tablet Take 81 mg by mouth daily.   B-D UF III MINI PEN NEEDLES 31G X 5 MM Misc Generic drug:  Insulin Pen Needle CHECK BLOOD SUGAR TWO TO THREE TIMES DAILY.   conjugated estrogens vaginal cream Commonly  known as:  PREMARIN Place 1 Applicatorful vaginally daily. Apply 0.59m (pea-sized amount)  just inside the vaginal introitus with a finger-tip every night for two weeks and then Monday, Wednesday and Friday nights.   CRANBERRY PO Take by mouth.   CYSTEX PO Take by mouth.   estradiol 0.1 MG/GM vaginal cream Commonly known as:  ESTRACE VAGINAL Apply 0.565m(pea-sized amount)  just inside the vaginal introitus with a finger-tip every night for two weeks and then Monday, Wednesday and Friday nights.   FLORAJEN ACIDOPHILUS PO Take 1 capsule by mouth daily.   fluticasone 50 MCG/ACT nasal spray Commonly known as:  FLONASE Place 2 sprays into both nostrils daily.   glucose blood test strip Please dispense One Touch Ultra testing strips for patient to use with home device. Checking blood sugars 2-3 times a day. Dx code 250.0   insulin lispro 100 UNIT/ML KiwkPen Commonly known as:  HUMALOG KWIKPEN Inject 0.1 mLs (10 Units total) into the skin 3 (three) times daily.   Krill Oil 500 MG Caps Take 1 capsule by mouth daily.   LANTUS SOLOSTAR 100 UNIT/ML Solostar Pen Generic drug:  Insulin Glargine INJECT 50 UNITS INTO THE SKIN ONCE DAILY.   lisinopril 10 MG tablet Commonly known as:  PRINIVIL,ZESTRIL Take 1 tablet (10 mg total) by mouth daily.   loratadine 10 MG tablet Commonly known as:  CLARITIN Take 10 mg by mouth daily.   metFORMIN 1000 MG tablet Commonly known as:  GLUCOPHAGE Take 1 tablet (1,000 mg total) by mouth 2 (two) times daily.   multivitamin tablet Take 1 tablet by mouth daily. Vitamin D3   onetouch ultrasoft lancets USE TO CHECK BLOOD SUGAR 2-3 TIMES PER DAY   OT ULTRA/FASTTK CNTRL SOLN Soln 1 drop by In Vitro route once. Please dispense controls for patient to use at home with One Touch Ultra machine. Dx code 250.0   ranitidine 150 MG tablet Commonly known as:  ZANTAC Take 1 tablet (150 mg total) by mouth 2 (two) times daily.   simvastatin 20 MG  tablet Commonly known as:  ZOCOR Take 1 tablet (20 mg total) by mouth at bedtime.   sitaGLIPtin 100 MG tablet Commonly known as:  JANUVIA Take 1 tablet (100 mg total) by mouth daily.   traMADol 50 MG tablet Commonly known as:  ULTRAM Take 1-2 tablets (50-100 mg total) by mouth 3 (three) times daily as needed.   Vitamin D 2000 units Caps Take 2,000 Units by mouth daily.       Allergies:  Allergies  Allergen Reactions  . Ciprofloxacin     Worsening symptoms of neuropathy  . Omeprazole Itching, Swelling and Other (See Comments)  . Proton Pump Inhibitors     Other reaction(s): Angioedema--prilosec  Family History: Family History  Problem Relation Age of Onset  . Adopted: Yes    Social History:  reports that she has never smoked. She has never used smokeless tobacco. She reports that she does not drink alcohol or use drugs.  ROS: UROLOGY Frequent Urination?: Yes Hard to postpone urination?: No Burning/pain with urination?: No Get up at night to urinate?: No Leakage of urine?: No Urine stream starts and stops?: No Trouble starting stream?: No Do you have to strain to urinate?: No Blood in urine?: No Urinary tract infection?: No Sexually transmitted disease?: No Injury to kidneys or bladder?: No Painful intercourse?: No Weak stream?: No Currently pregnant?: No Vaginal bleeding?: No Last menstrual period?: n  Gastrointestinal Nausea?: No Vomiting?: No Indigestion/heartburn?: No Diarrhea?: No Constipation?: No  Constitutional Fever: No Night sweats?: No Weight loss?: No Fatigue?: No  Skin Skin rash/lesions?: No Itching?: No  Eyes Blurred vision?: No Double vision?: No  Ears/Nose/Throat Sore throat?: No Sinus problems?: No  Hematologic/Lymphatic Swollen glands?: No Easy bruising?: No  Cardiovascular Leg swelling?: No Chest pain?: No  Respiratory Cough?: No Shortness of breath?: No  Endocrine Excessive thirst?:  No  Musculoskeletal Back pain?: No Joint pain?: No  Neurological Headaches?: No Dizziness?: No  Psychologic Depression?: No Anxiety?: No  Physical Exam: BP 129/75   Pulse 78   Ht _0  (1.549 m)   Wt 152 lb (68.9 kg)   BMI 28.72 kg/m   Constitutional: Well nourished. Alert and oriented, No acute distress. HEENT: [place] AT, moist mucus membranes. Trachea midline, no masses. Cardiovascular: No clubbing, cyanosis, or edema. Respiratory: Normal respiratory effort, no increased work of breathing. GI: Abdomen is soft, non tender, non distended, no abdominal masses. Liver and spleen not palpable.  No hernias appreciated.  Stool sample for occult testing is not indicated.   GU: No CVA tenderness.  No bladder fullness or masses. Improvement seen in atrophy in external genitalia, normal pubic hair distribution, no lesions.  Normal urethral meatus, no lesions, no prolapse, no discharge.   No urethral masses, tenderness and/or tenderness. No bladder fullness, tenderness or masses. Normal vagina mucosa, good estrogen effect, no discharge, no lesions, good pelvic support, Grade II cystocele is noted.  No rectocele noted.  Cervix and uterus are surgically absent.  No adnexal/parametria masses or tenderness noted.  Anus and perineum are without rashes or lesions.    Skin: No rashes, bruises or suspicious lesions. Lymph: No cervical or inguinal adenopathy. Neurologic: Grossly intact, no focal deficits, moving all 4 extremities. Psychiatric: Normal mood and affect.  Laboratory Data: Lab Results  Component Value Date   WBC 8.6 05/28/2016   HGB 15.0 05/28/2016   HCT 45.5 05/28/2016   MCV 82.1 05/28/2016   PLT 277.0 05/28/2016    Lab Results  Component Value Date   CREATININE 0.74 06/17/2016    Lab Results  Component Value Date   HGBA1C 8.2 (H) 02/10/2017    Lab Results  Component Value Date   TSH 0.61 05/28/2016       Component Value Date/Time   CHOL 134 06/17/2016 0810   HDL  34.80 (L) 06/17/2016 0810   CHOLHDL 4 06/17/2016 0810   VLDL 38.8 06/17/2016 0810   LDLCALC 60 06/17/2016 0810    Lab Results  Component Value Date   AST 23 06/17/2016   Lab Results  Component Value Date   ALT 24 06/17/2016     Assessment & Plan:    1. Vaginal atrophy  - start the estrogen cream  three nights weekly  - RTC in 12 months for exam  2. Urgency  - patient has noticed an improvement in her urgency since starting the cream   - no ureteral stones identified on CT scan  - discussed starting medications, refer to PT or PTNS for her urgency, she declines at this time  3. Cystocele  - patient is not having any SUI  - manage conservatively at this time  Return in about 1 year (around 04/05/2018) for OAB questionnaire, PVR and exam.  These notes generated with voice recognition software. I apologize for typographical errors.  Zara Council, [facility] [address], [place], [place] [address] [phone]

## 2017-04-05 ENCOUNTER — Encounter: Payer: Self-pay | Admitting: Urology

## 2017-04-05 ENCOUNTER — Ambulatory Visit: Payer: Medicare Other | Admitting: Urology

## 2017-04-05 VITALS — BP 129/75 | HR 78 | Ht 61.0 in | Wt 152.0 lb

## 2017-04-05 DIAGNOSIS — R3915 Urgency of urination: Secondary | ICD-10-CM | POA: Diagnosis not present

## 2017-04-05 DIAGNOSIS — N952 Postmenopausal atrophic vaginitis: Secondary | ICD-10-CM | POA: Diagnosis not present

## 2017-04-05 DIAGNOSIS — N8111 Cystocele, midline: Secondary | ICD-10-CM

## 2017-04-05 MED ORDER — ESTROGENS, CONJUGATED 0.625 MG/GM VA CREA: 1.0000 | TOPICAL_CREAM | Freq: Every day | VAGINAL | 12 refills | Status: DC

## 2017-04-05 MED ORDER — ESTRADIOL 0.1 MG/GM VA CREA: TOPICAL_CREAM | VAGINAL | 12 refills | Status: DC

## 2017-04-11 ENCOUNTER — Ambulatory Visit (INDEPENDENT_AMBULATORY_CARE_PROVIDER_SITE_OTHER): Payer: Medicare Other

## 2017-04-11 DIAGNOSIS — E538 Deficiency of other specified B group vitamins: Secondary | ICD-10-CM

## 2017-04-11 MED ORDER — CYANOCOBALAMIN 1000 MCG/ML IJ SOLN
1000.0000 ug | Freq: Once | INTRAMUSCULAR | Status: AC
  Administered 2017-04-11: 1000 ug via INTRAMUSCULAR

## 2017-04-25 ENCOUNTER — Other Ambulatory Visit: Payer: Self-pay | Admitting: Internal Medicine

## 2017-04-25 ENCOUNTER — Telehealth: Payer: Self-pay

## 2017-04-25 DIAGNOSIS — Z794 Long term (current) use of insulin: Principal | ICD-10-CM

## 2017-04-25 DIAGNOSIS — E1142 Type 2 diabetes mellitus with diabetic polyneuropathy: Secondary | ICD-10-CM

## 2017-04-25 MED ORDER — INSULIN LISPRO 100 UNIT/ML (KWIKPEN): 10.0000 [IU] | PEN_INJECTOR | Freq: Every day | SUBCUTANEOUS | 3 refills | Status: DC

## 2017-04-25 MED ORDER — INSULIN GLARGINE 100 UNIT/ML SOLOSTAR PEN: 60.0000 [IU] | PEN_INJECTOR | Freq: Every morning | SUBCUTANEOUS | 3 refills | Status: DC

## 2017-04-25 NOTE — Telephone Encounter (Signed)
Spoke to pt and gave new instructions from Dr Army Melia:   Continue Lantus 60 units. STOP Sliding scale and just take 10 units of Humalog before super only if BS is 130 or greater. Pt verbalized understanding.  New rx sent to [facility] with correct doses.

## 2017-05-02 ENCOUNTER — Encounter: Payer: Self-pay | Admitting: Internal Medicine

## 2017-05-02 ENCOUNTER — Ambulatory Visit (INDEPENDENT_AMBULATORY_CARE_PROVIDER_SITE_OTHER): Payer: Medicare Other | Admitting: Internal Medicine

## 2017-05-02 VITALS — BP 124/60 | HR 86 | Temp 97.5°F | Resp 17 | Ht 61.0 in | Wt 152.0 lb

## 2017-05-02 DIAGNOSIS — E1142 Type 2 diabetes mellitus with diabetic polyneuropathy: Secondary | ICD-10-CM

## 2017-05-02 DIAGNOSIS — I10 Essential (primary) hypertension: Secondary | ICD-10-CM

## 2017-05-02 DIAGNOSIS — Z794 Long term (current) use of insulin: Secondary | ICD-10-CM

## 2017-05-02 DIAGNOSIS — M792 Neuralgia and neuritis, unspecified: Secondary | ICD-10-CM

## 2017-05-02 MED ORDER — TRAMADOL HCL 50 MG PO TABS: 50.0000 mg | ORAL_TABLET | Freq: Three times a day (TID) | ORAL | 3 refills | Status: DC | PRN

## 2017-05-02 NOTE — Progress Notes (Signed)
Date:  05/02/2017   Name:  Anita Mercer   DOB:  1946/01/22   MRN:  [id number]   Chief Complaint: Diabetes (follow up) Diabetes  She presents for her follow-up diabetic visit. She has type 2 diabetes mellitus. Her disease course has been improving. Pertinent negatives for hypoglycemia include no headaches or tremors. Associated symptoms include foot paresthesias. Pertinent negatives for diabetes include no chest pain, no fatigue, no polydipsia and no polyuria. Symptoms are stable. Current diabetic treatment includes insulin injections and oral agent (dual therapy). She is compliant with treatment all of the time. Her weight is stable. She is following a diabetic diet. An ACE inhibitor/angiotensin II receptor blocker is being taken.  Hypertension  This is a chronic problem. The problem is controlled. Pertinent negatives include no chest pain, headaches, palpitations or shortness of breath.  Over the past few months have been adjusting insulin dosing and times to improve BS.  Now on 60 units of Lantus plus 10 units of Humalog before supper daily and metformin and Januvia.  Her blood sugars are slightly improved.  She would like to move Lantus to bedtime for more consistent dosing.    Review of Systems  Constitutional: Negative for appetite change, fatigue, fever and unexpected weight change.  HENT: Negative for tinnitus and trouble swallowing.   Eyes: Negative for visual disturbance.  Respiratory: Negative for cough, chest tightness and shortness of breath.   Cardiovascular: Negative for chest pain, palpitations and leg swelling.  Gastrointestinal: Negative for abdominal pain.  Endocrine: Negative for polydipsia and polyuria.  Genitourinary: Negative for dysuria and hematuria.  Musculoskeletal: Positive for myalgias (frequent muscle cramps). Negative for arthralgias.  Neurological: Negative for tremors, numbness and headaches.  Psychiatric/Behavioral: Negative for dysphoric mood.     Patient Active Problem List   Diagnosis Date Noted  . B12 deficiency 07/12/2016  . Gastroesophageal reflux disease with esophagitis 07/12/2016  . Osteopenia determined by x-ray 07/09/2016  . De Quervain's tenosynovitis, right 06/17/2016  . Chronic fatigue 06/16/2016  . Hearing loss 05/28/2014  . Benign essential tremor 05/10/2013  . Allergic rhinitis 03/15/2013  . Neuropathic pain 09/11/2012  . Paroxysmal nerve pain 09/11/2012  . Hyperlipidemia associated with type 2 diabetes mellitus ([place]) 10/01/2011  . Essential hypertension 10/01/2011  . Type 2 diabetes mellitus with diabetic polyneuropathy, with long-term current use of insulin ([place]) 10/01/2011    Prior to Admission medications   Medication Sig Start Date End Date Taking? Authorizing Provider  aspirin EC 81 MG tablet Take 81 mg by mouth daily.     Yes [provider]  B-D UF III MINI PEN NEEDLES 31G X 5 MM MISC CHECK BLOOD SUGAR TWO TO THREE TIMES DAILY. 03/29/17  Yes [name], MD  Blood Glucose Calibration (OT ULTRA/FASTTK CNTRL SOLN) SOLN 1 drop by In Vitro route once. Please dispense controls for patient to use at home with One Touch Ultra machine. Dx code 250.0 03/15/13  Yes Jackolyn Confer, MD  Cholecalciferol (VITAMIN D) 2000 units CAPS Take 2,000 Units by mouth daily.   Yes [provider]  conjugated estrogens (PREMARIN) vaginal cream Place 1 Applicatorful vaginally daily. Apply 0.11m (pea-sized amount)  just inside the vaginal introitus with a finger-tip every night for two weeks and then Monday, Wednesday and Friday nights. 04/05/17  Yes McGowan, Shannon A, PA-C  CRANBERRY PO Take by mouth.   Yes [provider]  estradiol (ESTRACE VAGINAL) 0.1 MG/GM vaginal cream Apply 0.556m(pea-sized amount)  just inside the  vaginal introitus with a finger-tip every night for two weeks and then Monday, Wednesday and Friday nights. 04/05/17  Yes McGowan, Larene Beach A, PA-C  fluticasone (FLONASE) 50  MCG/ACT nasal spray Place 2 sprays into both nostrils daily. 02/21/14  Yes Jackolyn Confer, MD  glucose blood test strip Please dispense One Touch Ultra testing strips for patient to use with home device. Checking blood sugars 2-3 times a day. Dx code 250.0 12/10/15  Yes Jackolyn Confer, MD  Insulin Glargine (LANTUS SOLOSTAR) 100 UNIT/ML Solostar Pen Inject 60 Units into the skin every morning. 04/25/17  Yes [name], MD  insulin lispro (HUMALOG KWIKPEN) 100 UNIT/ML KiwkPen Inject 0.1 mLs (10 Units total) into the skin daily before supper. Hold insulin only if BS <130 04/25/17  Yes [name], MD  Krill Oil 500 MG CAPS Take 1 capsule by mouth daily.   Yes [provider]  Lactobacillus (FLORAJEN ACIDOPHILUS PO) Take 1 capsule by mouth daily.   Yes [provider]  Lancets (ONETOUCH ULTRASOFT) lancets USE TO CHECK BLOOD SUGAR 2-3 TIMES PER DAY 06/06/15  Yes Jackolyn Confer, MD  lisinopril (PRINIVIL,ZESTRIL) 10 MG tablet Take 1 tablet (10 mg total) by mouth daily. 10/12/16  Yes [name], MD  loratadine (CLARITIN) 10 MG tablet Take 10 mg by mouth daily.   Yes [provider]  metFORMIN (GLUCOPHAGE) 1000 MG tablet Take 1 tablet (1,000 mg total) by mouth 2 (two) times daily. 10/12/16  Yes [name], MD  Methenamine-Sodium Salicylate (CYSTEX PO) Take by mouth.   Yes [provider]  Multiple Vitamin (MULTIVITAMIN) tablet Take 1 tablet by mouth daily. Vitamin D3   Yes [provider]  ranitidine (ZANTAC) 150 MG tablet Take 1 tablet (150 mg total) by mouth 2 (two) times daily. 05/03/16  Yes Jackolyn Confer, MD  simvastatin (ZOCOR) 20 MG tablet Take 1 tablet (20 mg total) by mouth at bedtime. 10/12/16  Yes [name], MD  sitaGLIPtin (JANUVIA) 100 MG tablet Take 1 tablet (100 mg total) by mouth daily. 10/12/16  Yes [name], MD  traMADol (ULTRAM) 50 MG tablet Take 1-2 tablets (50-100 mg total) by mouth 3 (three)  times daily as needed. 02/10/17  Yes [name], MD    Allergies  Allergen Reactions  . Ciprofloxacin     Worsening symptoms of neuropathy  . Omeprazole Itching, Swelling and Other (See Comments)  . Proton Pump Inhibitors     Other reaction(s): Angioedema--prilosec     Past Surgical History:  Procedure Laterality Date  . ABDOMINAL HYSTERECTOMY    . ACHILLES TENDON SURGERY  2006   removela of bone spur  . BUNIONECTOMY    . CATARACT EXTRACTION Bilateral 10/18/11  04/04/13  . CHOLECYSTECTOMY    . COLONOSCOPY  01/13/2011  . COLONOSCOPY WITH PROPOFOL N/A 08/11/2015   Procedure: COLONOSCOPY WITH PROPOFOL;  Surgeon: Lollie Sails, MD;  Location: [facility] ENDOSCOPY;  Service: Endoscopy;  Laterality: N/A;  . echo  10/23/2012  . ESOPHAGOGASTRODUODENOSCOPY (EGD) WITH PROPOFOL N/A 08/11/2015   Procedure: ESOPHAGOGASTRODUODENOSCOPY (EGD) WITH PROPOFOL;  Surgeon: Lollie Sails, MD;  Location: [facility] ENDOSCOPY;  Service: Endoscopy;  Laterality: N/A;  . FOOT SURGERY     Dr. Paulla Dolly, bone spurs  . GALLBLADDER SURGERY    . skin tags    . UPPER GASTROINTESTINAL ENDOSCOPY  08/08/2015   with MAC- barretts and duodenitis    Social History  Substance Use Topics  . Smoking status: Never  Smoker  . Smokeless tobacco: Never Used  . Alcohol use No     Medication list has been reviewed and updated.   Physical Exam  Constitutional: She is oriented to person, place, and time. She appears well-developed. No distress.  HENT:  Head: Normocephalic and atraumatic.  Cardiovascular: Normal rate, regular rhythm and normal heart sounds.   Pulmonary/Chest: Effort normal. No respiratory distress.  Musculoskeletal: Normal range of motion.  Neurological: She is alert and oriented to person, place, and time.  Skin: Skin is warm and dry. No rash noted.  Psychiatric: She has a normal mood and affect. Her behavior is normal. Thought content normal.  Nursing note and vitals reviewed.   BP 124/60 (BP  Location: Left Arm, Patient Position: Sitting, Cuff Size: Normal)   Ht _0  (1.549 m)   Wt 152 lb (68.9 kg)   BMI 28.72 kg/m   Assessment and Plan: 1. Essential hypertension controlled  2. Type 2 diabetes mellitus with diabetic polyneuropathy, with long-term current use of insulin (HCC) Continue Lantus 60 units - consider increasing to 70 units if A1c not improved Continue 10 units Humalog at supper - Hemoglobin S2L - Basic metabolic panel  3. Neuropathic pain stable - traMADol (ULTRAM) 50 MG tablet; Take 1-2 tablets (50-100 mg total) by mouth 3 (three) times daily as needed.  Dispense: 180 tablet; Refill: 3   Meds ordered this encounter  Medications  . traMADol (ULTRAM) 50 MG tablet    Sig: Take 1-2 tablets (50-100 mg total) by mouth 3 (three) times daily as needed.    Dispense:  180 tablet    Refill:  Kincaid, MD [facility]  05/02/2017

## 2017-05-02 NOTE — Patient Instructions (Signed)
Can move Lantus dose to 10 PM daily.  Continue Humalog at supper time daily.  Will advise on change in Lantus dose after labs return.

## 2017-05-03 LAB — BASIC METABOLIC PANEL
BUN/Creatinine Ratio: 21 (ref 12–28)
BUN: 15 mg/dL (ref 8–27)
CALCIUM: 10 mg/dL (ref 8.7–10.3)
CHLORIDE: 97 mmol/L (ref 96–106)
CO2: 24 mmol/L (ref 20–29)
Creatinine, Ser: 0.73 mg/dL (ref 0.57–1.00)
GFR calc Af Amer: 96 mL/min/{1.73_m2} (ref >59)
GFR calc non Af Amer: 83 mL/min/{1.73_m2} (ref >59)
GLUCOSE: 157 mg/dL — AB (ref 65–99)
POTASSIUM: 5.2 mmol/L (ref 3.5–5.2)
Sodium: 139 mmol/L (ref 134–144)

## 2017-05-03 LAB — HEMOGLOBIN A1C
ESTIMATED AVERAGE GLUCOSE: 203 mg/dL
HEMOGLOBIN A1C: 8.7 % — AB (ref 4.8–5.6)

## 2017-05-12 ENCOUNTER — Other Ambulatory Visit (INDEPENDENT_AMBULATORY_CARE_PROVIDER_SITE_OTHER): Payer: Medicare Other

## 2017-05-12 DIAGNOSIS — E538 Deficiency of other specified B group vitamins: Secondary | ICD-10-CM | POA: Diagnosis not present

## 2017-05-12 MED ORDER — CYANOCOBALAMIN 1000 MCG/ML IJ SOLN
1000.0000 ug | Freq: Once | INTRAMUSCULAR | Status: AC
  Administered 2017-05-12: 1000 ug via INTRAMUSCULAR

## 2017-05-24 ENCOUNTER — Other Ambulatory Visit: Payer: Self-pay | Admitting: Internal Medicine

## 2017-06-06 ENCOUNTER — Ambulatory Visit: Payer: Medicare Other | Admitting: Internal Medicine

## 2017-06-06 ENCOUNTER — Ambulatory Visit (INDEPENDENT_AMBULATORY_CARE_PROVIDER_SITE_OTHER): Payer: Medicare Other | Admitting: Podiatry

## 2017-06-06 ENCOUNTER — Ambulatory Visit (INDEPENDENT_AMBULATORY_CARE_PROVIDER_SITE_OTHER): Payer: Medicare Other

## 2017-06-06 VITALS — BP 121/89 | HR 85 | Temp 97.6°F | Resp 16

## 2017-06-06 DIAGNOSIS — E119 Type 2 diabetes mellitus without complications: Secondary | ICD-10-CM

## 2017-06-06 DIAGNOSIS — E1142 Type 2 diabetes mellitus with diabetic polyneuropathy: Secondary | ICD-10-CM | POA: Diagnosis not present

## 2017-06-06 DIAGNOSIS — Z0189 Encounter for other specified special examinations: Secondary | ICD-10-CM

## 2017-06-06 MED ORDER — GABAPENTIN 100 MG PO CAPS: 100.0000 mg | ORAL_CAPSULE | Freq: Every day | ORAL | 0 refills | Status: DC

## 2017-06-06 NOTE — Progress Notes (Signed)
Subjective:     Patient ID: Anita Mercer, female   DOB: 07-03-1946, 71 y.o.   MRN: [id number]  HPI: She presents today with a history of worsening diabetes complaining of neuropathy in both hands and feet. States that her feet really started bothering her plain 9 and 10:00 at night. She states that she takes tramadol provided by previous internal medicine doctor for the discomfort. She relates that she is becoming less stable and a little uneasy walking because of the increased sugars and is concerned that this may worsen her diabetic for a full neuropathy. She would like me to evaluate her feet today.   Review of Systems  Musculoskeletal: Positive for back pain and myalgias.  Neurological: Positive for tremors, seizures and weakness.  All other systems reviewed and are negative.      Objective:   Physical Exam: Vital signs are stable she is alert and oriented 3. Pulses are palpable. Neurologic sensorium is intact. She does have slightly diminished proprioceptive sensation as well as a slightly decreased pain level. Deep tendon reflexes are intact muscle strength +5 over 5 dorsiflexion plantar flexors and inverters and evertors on to the musculature is intact. Orthopedic evaluation and states all joints distal to the ankle range of motion without crepitation. Cutaneous evaluation demonstrates supple well-hydrated cutis no open lesions or wounds are noted. Radiographs taken today demonstrate no major osseous abnormalities other than a history of an [place] bunion repair double K wire fixation which is intact however the joint appears to be arthritic.     Assessment:     Diabetic peripheral neuropathy. 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    Plan:     After a lengthy discussion today we started her on gabapentin 100 mg at bedtime. I will follow up with her in 1 month for med check.

## 2017-06-07 ENCOUNTER — Telehealth: Payer: Self-pay | Admitting: Podiatry

## 2017-06-07 NOTE — Telephone Encounter (Signed)
I spoke with patient she stated that the Gabapentin caused severe pain and itching in her hands, legs and feet last night.  She reports her hands are feeling better now, but sore from rubbing and scratching.  I informed her that she could try Lyrica but she declined the medication.  She will take a Benadryl to help with itching and rest and will continue with Tramadol for neuropathy pain.  She will call us back if she has any other concerns.

## 2017-06-07 NOTE — Telephone Encounter (Signed)
Patient called in this morning saying that she thinks she has had a bad reaction to the Gabepentin that she was prescribed by Dr. Darla Lesches. Patient stated that she took the first dose at 11:00 and went to bed, awoke shortly after with terrific main in hands and legs and was itching profusely. She took Tramadol for pain at 2:00 and went back to bed. Advised patient not to take anymore of the drug until she hears from a nurse. Dr. Milinda Pointer may want to prescribe something different for her neuropathy pain.

## 2017-06-07 NOTE — Telephone Encounter (Signed)
Stop medication and do not retry. Only medication similar would be lyrica but may have similar side effects.

## 2017-06-13 ENCOUNTER — Ambulatory Visit: Payer: Medicare Other | Admitting: Internal Medicine

## 2017-06-13 ENCOUNTER — Ambulatory Visit (INDEPENDENT_AMBULATORY_CARE_PROVIDER_SITE_OTHER): Payer: Medicare Other

## 2017-06-13 DIAGNOSIS — E538 Deficiency of other specified B group vitamins: Secondary | ICD-10-CM | POA: Diagnosis not present

## 2017-06-13 MED ORDER — CYANOCOBALAMIN 1000 MCG/ML IJ SOLN
1000.0000 ug | Freq: Once | INTRAMUSCULAR | Status: AC
Start: 2017-06-13 — End: 2017-06-13
  Administered 2017-06-13: 1000 ug via INTRAMUSCULAR

## 2017-07-11 ENCOUNTER — Other Ambulatory Visit: Payer: Self-pay | Admitting: Internal Medicine

## 2017-07-11 ENCOUNTER — Encounter: Payer: Self-pay | Admitting: Podiatry

## 2017-07-11 ENCOUNTER — Ambulatory Visit (INDEPENDENT_AMBULATORY_CARE_PROVIDER_SITE_OTHER): Payer: Medicare Other | Admitting: Podiatry

## 2017-07-11 DIAGNOSIS — E1142 Type 2 diabetes mellitus with diabetic polyneuropathy: Secondary | ICD-10-CM

## 2017-07-11 DIAGNOSIS — Z794 Long term (current) use of insulin: Principal | ICD-10-CM

## 2017-07-11 MED ORDER — PEN NEEDLES 32G X 4 MM MISC: 1.0000 | Freq: Every day | 3 refills | Status: DC

## 2017-07-11 MED ORDER — INSULIN GLARGINE 100 UNIT/ML SOLOSTAR PEN: 75.0000 [IU] | PEN_INJECTOR | Freq: Every morning | SUBCUTANEOUS | 3 refills | Status: DC

## 2017-07-11 NOTE — Progress Notes (Signed)
She presents today stating that she was unable to take gabapentin secondary to severe pain in her hands and feet that she developed following the drug. She states that her blood sugars have been out of control and that she expects her neuropathy to worsen. She states that she has not noticed any worsening of her stability or ambulation at this point.  Objective: Vital signs are stable she is alert and oriented 3 pulses are palpable. Neurologic sensorium is diminished per Semmes-Weinstein monofilament and vibratory sensation. Muscle strength appears to be normal symmetrical bilateral deep tendon reflexes similar. No open lesions or wounds are noted. She has full range of motion all joints distal to the ankle.  Assessment: Diabetes mellitus with diabetic peripheral neuropathy.  Plan: We'll follow up with her in 6 months.

## 2017-07-14 ENCOUNTER — Telehealth: Payer: Self-pay | Admitting: Podiatry

## 2017-07-14 ENCOUNTER — Telehealth: Payer: Self-pay

## 2017-07-14 ENCOUNTER — Ambulatory Visit (INDEPENDENT_AMBULATORY_CARE_PROVIDER_SITE_OTHER): Payer: Medicare Other

## 2017-07-14 DIAGNOSIS — E538 Deficiency of other specified B group vitamins: Secondary | ICD-10-CM | POA: Diagnosis not present

## 2017-07-14 MED ORDER — CYANOCOBALAMIN 1000 MCG/ML IJ SOLN
1000.0000 ug | Freq: Once | INTRAMUSCULAR | Status: AC
  Administered 2017-07-14: 1000 ug via INTRAMUSCULAR

## 2017-07-14 NOTE — Telephone Encounter (Signed)
Dr. Chalmers Cater on [address] in [place]

## 2017-07-14 NOTE — Telephone Encounter (Signed)
Patient came in the office Monday, and wants you to refer her to an endocrinologist. She would like it to be in [place] if possible but would also go to [place].

## 2017-07-14 NOTE — Telephone Encounter (Signed)
Patient came in today to get B12. Asked if I could find out how to remove [facility] note that states patient needs a sodium infusion done. I checked into it and [employer]. Please tell patient to contact them at [phone] to see if they will remove it from her [facility] record. Thanks.

## 2017-07-14 NOTE — Telephone Encounter (Signed)
Left detailed message.   

## 2017-07-19 ENCOUNTER — Telehealth: Payer: Self-pay | Admitting: Internal Medicine

## 2017-07-19 NOTE — Telephone Encounter (Signed)
I did not see that normal saline was listed in the patients medication list, however I was able to create an addendum to the patient's Endoscopy procedure from 10/18/16 and remove it from that list.  I returned the patients call and left a voicemail that this had been taken care of and advised patient to call back with any further questions.

## 2017-07-19 NOTE — Telephone Encounter (Signed)
Pt concerned because normal saline still pops up on her information as clinic administered medications. This was given at her colon in November of 2017. Pt would like this removed. Can you help with this?

## 2017-07-21 ENCOUNTER — Other Ambulatory Visit: Payer: Self-pay | Admitting: Internal Medicine

## 2017-07-21 DIAGNOSIS — Z794 Long term (current) use of insulin: Principal | ICD-10-CM

## 2017-07-21 DIAGNOSIS — E1142 Type 2 diabetes mellitus with diabetic polyneuropathy: Secondary | ICD-10-CM

## 2017-08-02 ENCOUNTER — Ambulatory Visit: Payer: Medicare Other | Admitting: Internal Medicine

## 2017-08-16 ENCOUNTER — Ambulatory Visit: Payer: Medicare Other

## 2017-08-19 ENCOUNTER — Other Ambulatory Visit: Payer: Self-pay | Admitting: Internal Medicine

## 2017-08-19 DIAGNOSIS — I10 Essential (primary) hypertension: Secondary | ICD-10-CM

## 2017-09-29 ENCOUNTER — Other Ambulatory Visit: Payer: Self-pay | Admitting: Urology

## 2017-10-21 ENCOUNTER — Other Ambulatory Visit: Payer: Self-pay | Admitting: Internal Medicine

## 2017-10-21 DIAGNOSIS — Z794 Long term (current) use of insulin: Principal | ICD-10-CM

## 2017-10-21 DIAGNOSIS — E782 Mixed hyperlipidemia: Secondary | ICD-10-CM

## 2017-10-21 DIAGNOSIS — E1142 Type 2 diabetes mellitus with diabetic polyneuropathy: Secondary | ICD-10-CM

## 2017-12-14 ENCOUNTER — Ambulatory Visit: Payer: Medicare Other

## 2018-01-04 ENCOUNTER — Ambulatory Visit: Payer: Medicare Other | Admitting: Podiatry

## 2018-01-25 ENCOUNTER — Encounter: Payer: Self-pay | Admitting: Urology

## 2018-01-25 ENCOUNTER — Ambulatory Visit: Payer: Medicare Other | Admitting: Urology

## 2018-01-25 VITALS — BP 119/75 | HR 81 | Resp 16 | Ht 61.0 in | Wt 143.0 lb

## 2018-01-25 DIAGNOSIS — N8111 Cystocele, midline: Secondary | ICD-10-CM

## 2018-01-25 DIAGNOSIS — Z8719 Personal history of other diseases of the digestive system: Secondary | ICD-10-CM

## 2018-01-25 DIAGNOSIS — N952 Postmenopausal atrophic vaginitis: Secondary | ICD-10-CM | POA: Diagnosis not present

## 2018-01-25 MED ORDER — ESTRADIOL 0.1 MG/GM VA CREA: 1.0000 g | TOPICAL_CREAM | VAGINAL | 12 refills | Status: DC

## 2018-01-25 NOTE — Progress Notes (Signed)
01/25/2018 8:58 AM   Anita Mercer January 18, 1946 [id number]  Referring provider: Donley Redder, MD [address] [place], [place] [address]  No chief complaint on file.   HPI: 72 year old female with history of atrophic vaginitis, "recurrent urinary tract infections" and urinary urgency who returns today for routine annual follow-up.  She was previously seen and evaluated last year by Zara Council.  Prior to this evaluation, she had been treated for multiple UTIs despite negative cultures.  She was found to have significant atrophic vaginitis which was treated with topical estrogen cream.    She had been doing extremely well on topical estrogen cream.  She was using this 3 times a week.  She ran out of this medication about 3 weeks ago and has not used it in the interim.  She is noticed no change in her symptoms.  She would like to discuss whether or not she needs to continue this medication.  Overall, she denies any significant urinary frequency, urgency, or significant nocturia.  She notes worsening of urinary symptoms with constipation.  She is not sexually active.  She does have a known history of cystocele.  She denies any urinary retention or difficulty voiding.  She has had improved control of her diabetes.   PMH: Past Medical History:  Diagnosis Date  . Adopted   . Cataract   . Diabetes mellitus 2000  . GERD (gastroesophageal reflux disease)   . Hyperlipidemia   . Hypertension   . Neuromuscular disorder ([place])    peripheral neuropathy  . [facility] spotted fever)   . Sinusitis   . Tenosynovitis     Surgical History: Past Surgical History:  Procedure Laterality Date  . ABDOMINAL HYSTERECTOMY    . ACHILLES TENDON SURGERY  2006   removela of bone spur  . BUNIONECTOMY    . CATARACT EXTRACTION Bilateral 10/18/11  04/04/13  . CHOLECYSTECTOMY    . COLONOSCOPY  01/13/2011  . COLONOSCOPY WITH PROPOFOL N/A 08/11/2015   Procedure:  COLONOSCOPY WITH PROPOFOL;  Surgeon: Lollie Sails, MD;  Location: [facility] ENDOSCOPY;  Service: Endoscopy;  Laterality: N/A;  . echo  10/23/2012  . ESOPHAGOGASTRODUODENOSCOPY (EGD) WITH PROPOFOL N/A 08/11/2015   Procedure: ESOPHAGOGASTRODUODENOSCOPY (EGD) WITH PROPOFOL;  Surgeon: Lollie Sails, MD;  Location: [facility] ENDOSCOPY;  Service: Endoscopy;  Laterality: N/A;  . FOOT SURGERY     Dr. Paulla Dolly, bone spurs  . GALLBLADDER SURGERY    . skin tags    . UPPER GASTROINTESTINAL ENDOSCOPY  08/08/2015   with MAC- barretts and duodenitis    Home Medications:  Allergies as of 01/25/2018      Reactions   Ciprofloxacin    Worsening symptoms of neuropathy   Gabapentin    Experienced severe pain in limbs and tingling in hands   Omeprazole Itching, Swelling, Other (See Comments)   Proton Pump Inhibitors    Other reaction(s): Angioedema--prilosec       Medication List        Accurate as of 01/25/18 11:59 PM. Always use your most recent med list.          aspirin EC 81 MG tablet Take 81 mg by mouth daily.   estradiol 0.1 MG/GM vaginal cream Commonly known as:  ESTRACE Place 1 g vaginally 3 (three) times a week.   glucose blood test strip Please dispense One Touch Ultra testing strips for patient to use with home device. Checking blood sugars 2-3 times a day. Dx code 250.0  Insulin Glargine 100 UNIT/ML Solostar Pen Commonly known as:  LANTUS SOLOSTAR Inject 75 Units into the skin every morning.   insulin lispro 100 UNIT/ML KiwkPen Commonly known as:  HUMALOG KWIKPEN Inject 0.1 mLs (10 Units total) into the skin daily before supper. Hold insulin only if BS <130   lisinopril 10 MG tablet Commonly known as:  PRINIVIL,ZESTRIL TAKE ONE TABLET EVERY DAY   loratadine 10 MG tablet Commonly known as:  CLARITIN Take 10 mg by mouth daily.   metFORMIN 1000 MG tablet Commonly known as:  GLUCOPHAGE TAKE ONE TABLET TWICE DAILY   multivitamin tablet Take 1 tablet by mouth daily. Vitamin  D3   onetouch ultrasoft lancets USE TO CHECK BLOOD SUGAR 2-3 TIMES PER DAY   OT ULTRA/FASTTK CNTRL SOLN Soln 1 drop by In Vitro route once. Please dispense controls for patient to use at home with One Touch Ultra machine. Dx code 250.0   Pen Needles 32G X 4 MM Misc 1 each by Does not apply route daily.   ranitidine 150 MG tablet Commonly known as:  ZANTAC TAKE ONE TABLET BY MOUTH TWICE DAILY   simvastatin 20 MG tablet Commonly known as:  ZOCOR TAKE ONE TABLET AT BEDTIME   traMADol 50 MG tablet Commonly known as:  ULTRAM Take 1-2 tablets (50-100 mg total) by mouth 3 (three) times daily as needed.   TRULICITY 8.14 GY/1.8HU Sopn Generic drug:  Dulaglutide   vitamin B-12 1000 MCG tablet Commonly known as:  CYANOCOBALAMIN Take by mouth.   Vitamin D 2000 units Caps Take 2,000 Units by mouth daily.       Allergies:  Allergies  Allergen Reactions  . Ciprofloxacin     Worsening symptoms of neuropathy  . Gabapentin     Experienced severe pain in limbs and tingling in hands  . Omeprazole Itching, Swelling and Other (See Comments)  . Proton Pump Inhibitors     Other reaction(s): Angioedema--prilosec     Family History: Family History  Adopted: Yes    Social History:  reports that  has never smoked. she has never used smokeless tobacco. She reports that she does not drink alcohol or use drugs.  ROS: UROLOGY Frequent Urination?: No Hard to postpone urination?: No Burning/pain with urination?: No Get up at night to urinate?: No Leakage of urine?: No Urine stream starts and stops?: No Trouble starting stream?: No Do you have to strain to urinate?: No Blood in urine?: No Urinary tract infection?: No Sexually transmitted disease?: No Injury to kidneys or bladder?: No Painful intercourse?: No Weak stream?: No Currently pregnant?: No Vaginal bleeding?: No Last menstrual period?: n  Gastrointestinal Nausea?: No Vomiting?: No Indigestion/heartburn?:  No Diarrhea?: No Constipation?: No  Constitutional Fever: No Night sweats?: No Weight loss?: No Fatigue?: No  Skin Skin rash/lesions?: No Itching?: No  Eyes Blurred vision?: No Double vision?: No  Ears/Nose/Throat Sore throat?: No Sinus problems?: No  Hematologic/Lymphatic Swollen glands?: No Easy bruising?: No  Cardiovascular Leg swelling?: No Chest pain?: No  Respiratory Cough?: No Shortness of breath?: No  Endocrine Excessive thirst?: No  Musculoskeletal Back pain?: No Joint pain?: No  Neurological Headaches?: No Dizziness?: No  Psychologic Depression?: No Anxiety?: No  Physical Exam: BP 119/75   Pulse 81   Resp 16   Ht _0  (1.549 m)   Wt 143 lb (64.9 kg)   SpO2 99%   BMI 27.02 kg/m   Constitutional:  Alert and oriented, No acute distress.  Accompanied by husband today. HEENT: [place]  AT, moist mucus membranes.  Trachea midline, no masses. Cardiovascular: No clubbing, cyanosis, or edema. Respiratory: Normal respiratory effort, no increased work of breathing. Neurologic: Grossly intact, no focal deficits, moving all 4 extremities. Psychiatric: Normal mood and affect.  Laboratory Data: Lab Results  Component Value Date   WBC 8.6 05/28/2016   HGB 15.0 05/28/2016   HCT 45.5 05/28/2016   MCV 82.1 05/28/2016   PLT 277.0 05/28/2016    Lab Results  Component Value Date   CREATININE 0.73 05/02/2017     Lab Results  Component Value Date   HGBA1C 8.7 (H) 05/02/2017    Urinalysis N/a--> asymptomatic  Pertinent Imaging: N/a  Assessment & Plan:    1. Vaginal atrophy Improved with topical estrogen cream We discussed that this is a steroid hormone and the effects will likely last for several months without using the medication When symptoms recur, would recommend resuming this medication This will likely be a lifelong medication-she understands this We reviewed the risk and benefits again in detail  2. Cystocele,  midline Asymptomatic  3. History of constipation We discussed the relationship between urinary symptoms and bowel symptoms Encouraged titrating stool softener in order to achieve one soft bowel movement daily  F/u prn- return as needed for same day visit if develops signs/ symptoms of UTI  Hollice Espy, MD  [facility] [address], [place], [place] [address] [phone]

## 2018-04-04 ENCOUNTER — Ambulatory Visit: Payer: Medicare Other | Admitting: Urology

## 2018-04-04 IMAGING — CT CT ABD-PELV W/O CM
1 of 2 series · 15 of 32 positions shown, 19 images · non-contrast
Comparison: None.

CLINICAL DATA: Right lower quadrant pain for 2 months. Recurrent
urinary tract infections.

EXAM:
CT ABDOMEN AND PELVIS WITHOUT CONTRAST
TECHNIQUE: Multidetector CT imaging of the abdomen and pelvis was performed
following the standard protocol without IV contrast.

[Series 2: axial st · axial · 0.71mm/px · z∈[-930,-540]mm · 15 of 86 slices shown, 19 images]
[im 4/86  soft-tissue]
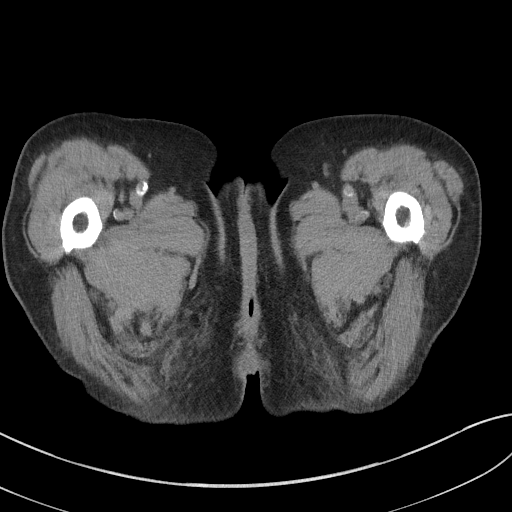
[im 4/86  bone]
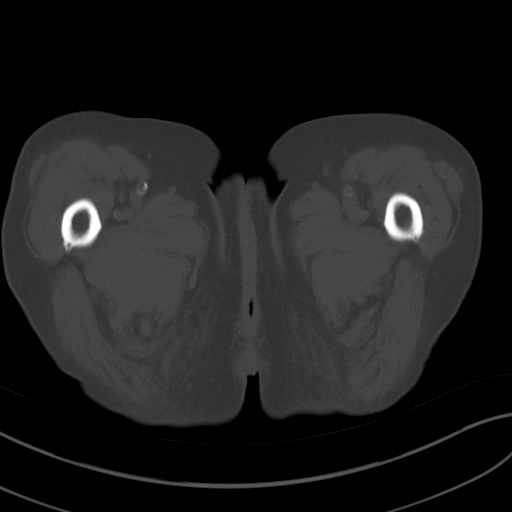
[im 11/86  soft-tissue]
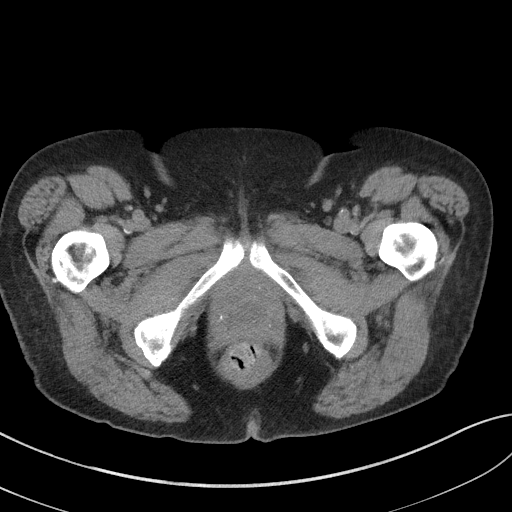
[im 18/86  soft-tissue]
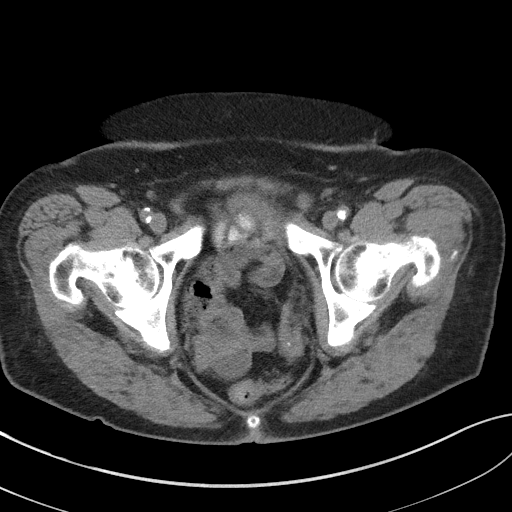
[im 24/86  soft-tissue]
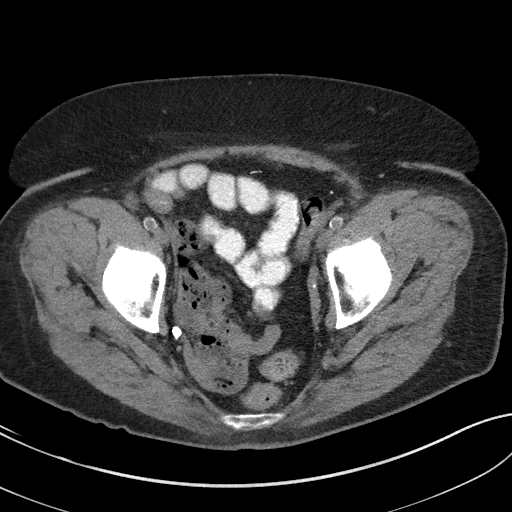
[im 31/86  soft-tissue]
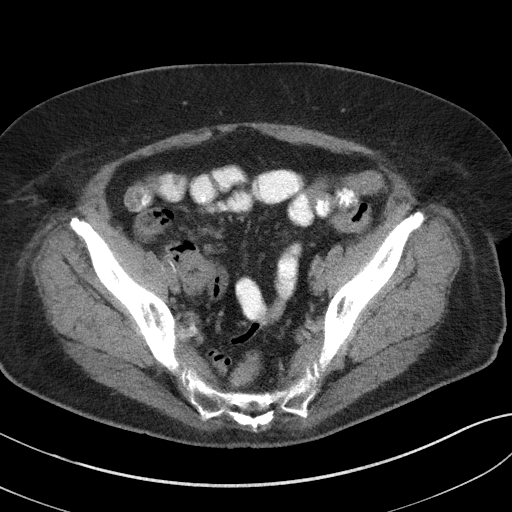
[im 38/86  soft-tissue]
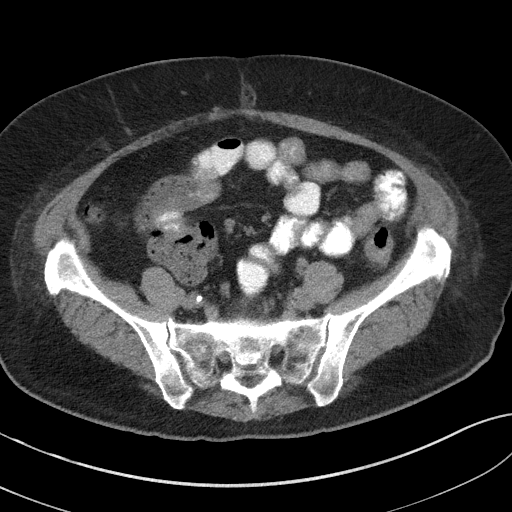
[im 45/86  soft-tissue]
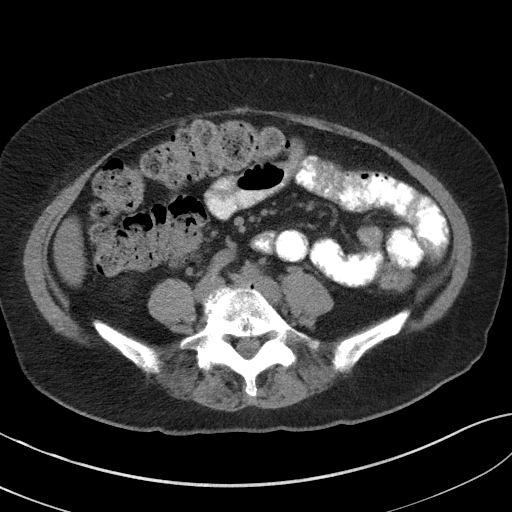
[im 48/86  soft-tissue]
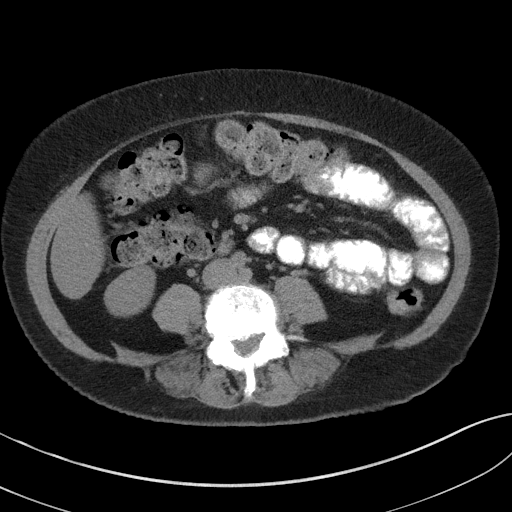
[im 55/86  soft-tissue]
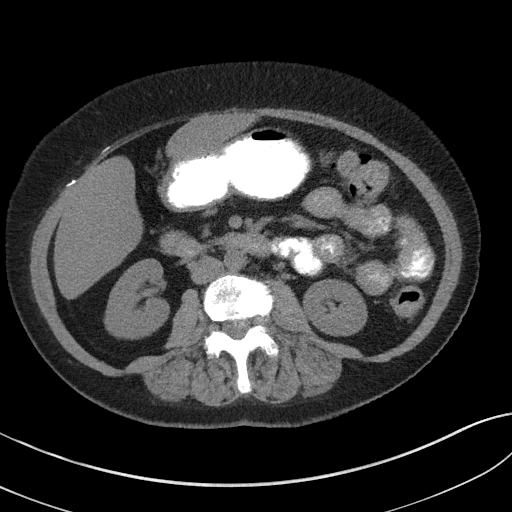
[im 55/86  bone]
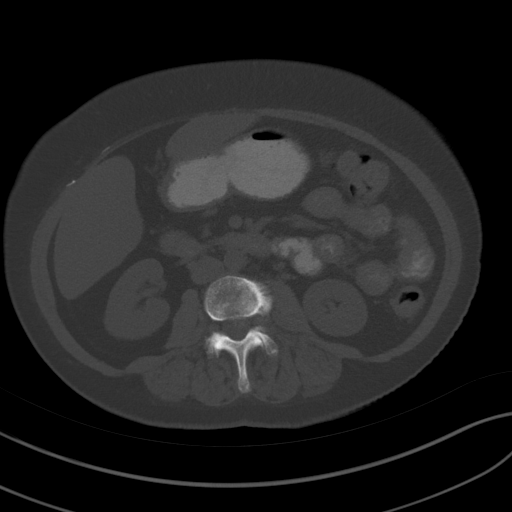
[im 62/86  soft-tissue]
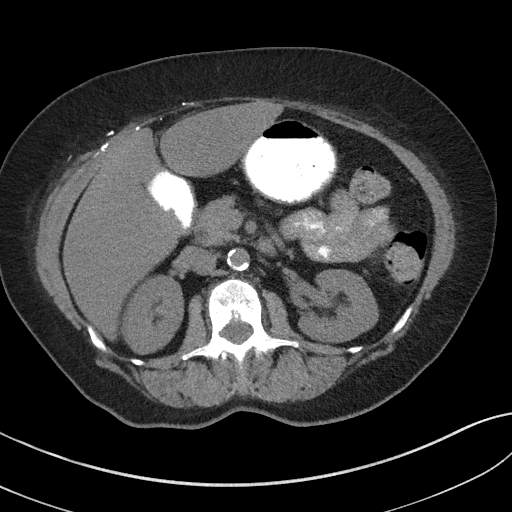
[im 69/86  soft-tissue]
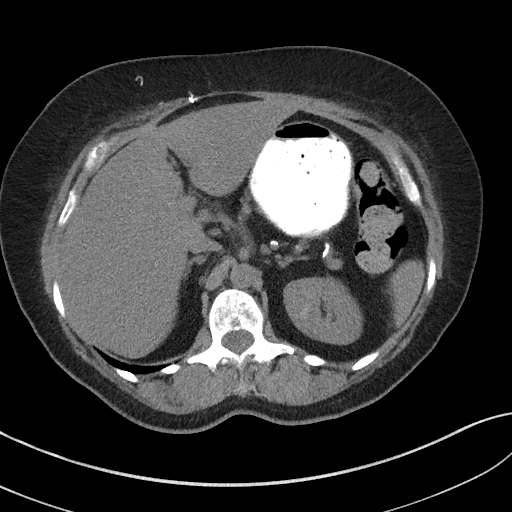
[im 72/86  lung]
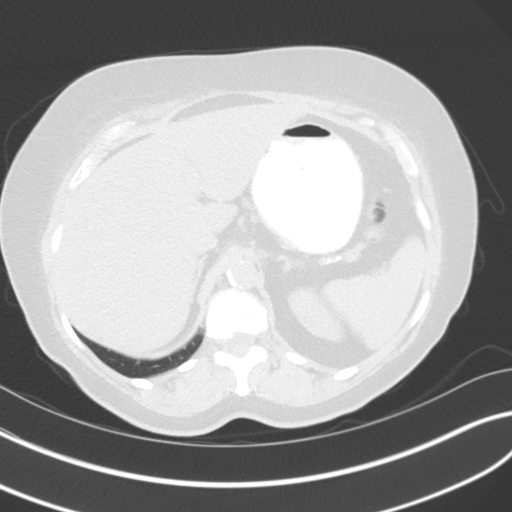
[im 75/86  soft-tissue]
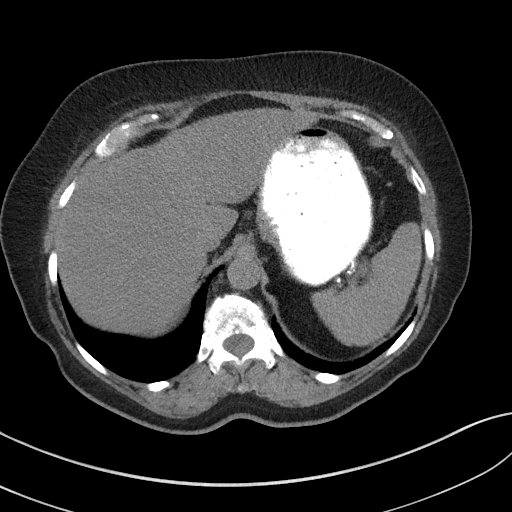
[im 75/86  lung]
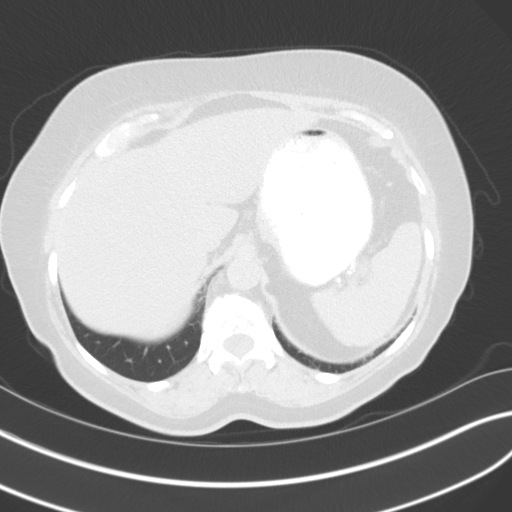
[im 79/86  lung]
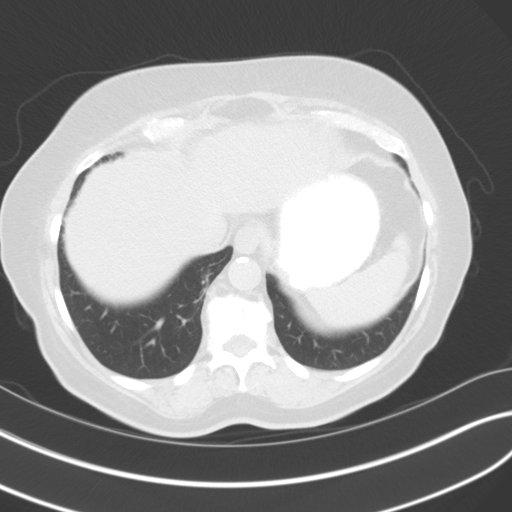
[im 82/86  soft-tissue]
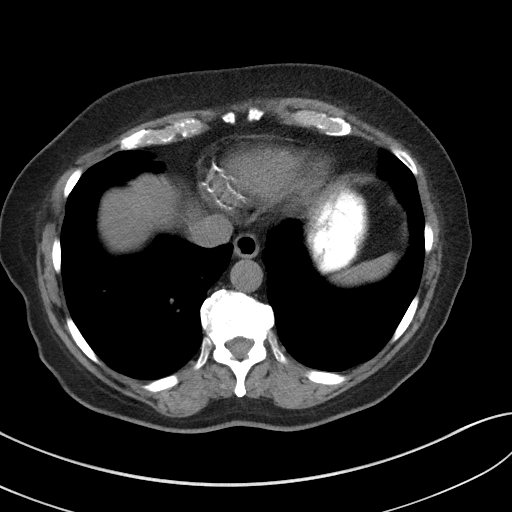
[im 82/86  lung]
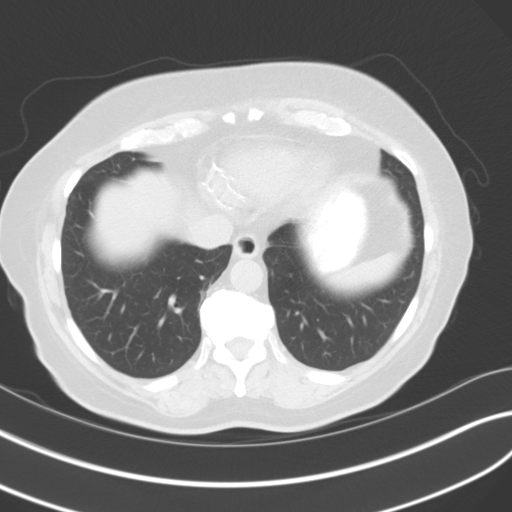

[15 of 32 positions shown; findings below may reference images not displayed]

FINDINGS: Lower chest: No acute findings.

Hepatobiliary: No masses visualized on this unenhanced exam. Mild
diffuse hepatic steatosis. Prior cholecystectomy noted. No evidence
of biliary dilatation.

Pancreas: No mass or inflammatory process visualized on this
unenhanced exam.

Spleen:  Within normal limits in size.

Adrenals/Urinary tract: No evidence of urolithiasis or
hydronephrosis. Empty urinary bladder.

Stomach/Bowel: No evidence of obstruction, inflammatory process, or
abnormal fluid collections. Normal appendix visualized. Moderate
colonic stool noted.

Vascular/Lymphatic: No pathologically enlarged lymph nodes
identified. No evidence of abdominal aortic aneurysm. Aortic
atherosclerosis.

Reproductive: Prior hysterectomy noted. Adnexal regions are
unremarkable in appearance.

Other:  None.

Musculoskeletal: No suspicious bone lesions identified. Advanced
lumbar degenerative spondylosis noted.
IMPRESSION: No evidence of urolithiasis, hydronephrosis, or other acute
findings.

Mild hepatic steatosis.

Aortic atherosclerosis.

## 2018-05-23 ENCOUNTER — Other Ambulatory Visit: Payer: Self-pay | Admitting: Internal Medicine

## 2018-07-03 ENCOUNTER — Ambulatory Visit: Payer: Medicare Other | Admitting: Podiatry

## 2018-07-21 ENCOUNTER — Other Ambulatory Visit: Payer: Self-pay | Admitting: Internal Medicine

## 2018-07-21 DIAGNOSIS — Z794 Long term (current) use of insulin: Principal | ICD-10-CM

## 2018-07-21 DIAGNOSIS — E1142 Type 2 diabetes mellitus with diabetic polyneuropathy: Secondary | ICD-10-CM

## 2018-08-02 ENCOUNTER — Encounter: Payer: Self-pay | Admitting: Podiatry

## 2018-08-02 ENCOUNTER — Ambulatory Visit: Payer: Medicare Other | Admitting: Podiatry

## 2018-08-02 DIAGNOSIS — E1142 Type 2 diabetes mellitus with diabetic polyneuropathy: Secondary | ICD-10-CM | POA: Diagnosis not present

## 2018-08-02 NOTE — Progress Notes (Signed)
She presents today for follow-up of her neuropathy bilaterally.  She states they are progressively getting worse particularly at nighttime.  She states that she took my advice and found her new doctor she seems to be doing much better at this point.  Objective: Vital signs are stable she is alert and oriented x3.  Pulses are palpable.  Neurologic sensorium is diminished per Semmes Weinstein monofilament.  Deep tendon reflexes are diminished.  The pain is intact.  Muscle strength is normal symmetrical bilateral.  Orthopedic evaluation demonstrates all joints distal to ankle full range of motion without crepitation.  Cutaneous evaluation demonstrates supple well-hydrated cutis no erythema edema cellulitis drainage or odor.  Assessment: Diabetic peripheral neuropathy.  Plan: She will continue current therapies I did provide her with a prescription for compound neuropathy cream I will follow-up with her in 6 or 8 months.

## 2019-01-31 ENCOUNTER — Ambulatory Visit: Payer: Medicare Other | Admitting: Podiatry

## 2020-10-25 ENCOUNTER — Other Ambulatory Visit: Payer: Self-pay | Admitting: Internal Medicine

## 2020-10-25 DIAGNOSIS — E119 Type 2 diabetes mellitus without complications: Secondary | ICD-10-CM

## 2020-11-05 ENCOUNTER — Other Ambulatory Visit: Payer: Self-pay | Admitting: Nurse Practitioner

## 2020-11-06 ENCOUNTER — Other Ambulatory Visit: Payer: Self-pay | Admitting: Nurse Practitioner

## 2020-11-06 DIAGNOSIS — M5430 Sciatica, unspecified side: Secondary | ICD-10-CM

## 2020-11-24 ENCOUNTER — Other Ambulatory Visit: Payer: Self-pay

## 2020-11-24 ENCOUNTER — Ambulatory Visit
Admission: RE | Admit: 2020-11-24 | Discharge: 2020-11-24 | Disposition: A | Discharge status: Home / Self Care | Payer: Medicare PPO | Source: Ambulatory Visit | Attending: Nurse Practitioner | Admitting: Nurse Practitioner

## 2020-11-24 DIAGNOSIS — M5430 Sciatica, unspecified side: Secondary | ICD-10-CM | POA: Diagnosis present

## 2020-11-24 DIAGNOSIS — M549 Dorsalgia, unspecified: Secondary | ICD-10-CM | POA: Insufficient documentation

## 2021-11-09 ENCOUNTER — Other Ambulatory Visit: Payer: Self-pay

## 2021-11-09 ENCOUNTER — Ambulatory Visit: Payer: Medicare PPO | Admitting: Dermatology

## 2021-11-09 DIAGNOSIS — D1800 Hemangioma unspecified site: Secondary | ICD-10-CM

## 2021-11-09 DIAGNOSIS — L578 Other skin changes due to chronic exposure to nonionizing radiation: Secondary | ICD-10-CM | POA: Diagnosis not present

## 2021-11-09 DIAGNOSIS — L821 Other seborrheic keratosis: Secondary | ICD-10-CM

## 2021-11-09 DIAGNOSIS — D229 Melanocytic nevi, unspecified: Secondary | ICD-10-CM

## 2021-11-09 DIAGNOSIS — B353 Tinea pedis: Secondary | ICD-10-CM

## 2021-11-09 DIAGNOSIS — Z1283 Encounter for screening for malignant neoplasm of skin: Secondary | ICD-10-CM | POA: Diagnosis not present

## 2021-11-09 DIAGNOSIS — L814 Other melanin hyperpigmentation: Secondary | ICD-10-CM

## 2021-11-09 DIAGNOSIS — Z872 Personal history of diseases of the skin and subcutaneous tissue: Secondary | ICD-10-CM | POA: Diagnosis not present

## 2021-11-09 DIAGNOSIS — L918 Other hypertrophic disorders of the skin: Secondary | ICD-10-CM

## 2021-11-09 MED ORDER — KETOCONAZOLE 2 % EX CREA: TOPICAL_CREAM | CUTANEOUS | 6 refills | Status: DC

## 2021-11-09 NOTE — Progress Notes (Signed)
° °  Follow-Up Visit   Subjective  Anita Mercer is a 75 y.o. female who presents for the following: Annual Exam (Hx AK's - patient has noticed a lesion on her side that she would like checked today.). The patient presents for Total-Body Skin Exam (TBSE) for skin cancer screening and mole check.  The patient has spots, moles and lesions to be evaluated, some may be new or changing.  The following portions of the chart were reviewed this encounter and updated as appropriate:   Tobacco   Allergies   Meds   Problems   Med Hx   Surg Hx   Fam Hx      Review of Systems:  No other skin or systemic complaints except as noted in HPI or Assessment and Plan.  Objective  Well appearing patient in no apparent distress; mood and affect are within normal limits.  A full examination was performed including scalp, head, eyes, ears, nose, lips, neck, chest, axillae, abdomen, back, buttocks, bilateral upper extremities, bilateral lower extremities, hands, feet, fingers, toes, fingernails, and toenails. All findings within normal limits unless otherwise noted below.  B/L foot Scaling and maceration web spaces and over distal and lateral soles.    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  ketoconazole (NIZORAL) 2 % cream - B/L foot Apply to the feet QHS.  Lentigines - Scattered tan macules - Due to sun exposure - Benign-appearing, observe - Recommend daily broad spectrum sunscreen SPF 30+ to sun-exposed areas, reapply every 2 hours as needed. - Call for any changes  Seborrheic Keratoses - Stuck-on, waxy, tan-brown papules and/or plaques  - Benign-appearing - Discussed benign etiology and prognosis. - Observe - Call for any changes  Melanocytic Nevi - Tan-brown and/or pink-flesh-colored symmetric macules and papules - Benign appearing on exam today - Observation - Call clinic for new or changing moles - Recommend  daily use of broad spectrum spf 30+ sunscreen to sun-exposed areas.   Hemangiomas - Red papules - Discussed benign nature - Observe - Call for any changes  Actinic Damage - Chronic condition, secondary to cumulative UV/sun exposure - diffuse scaly erythematous macules with underlying dyspigmentation - Recommend daily broad spectrum sunscreen SPF 30+ to sun-exposed areas, reapply every 2 hours as needed.  - Staying in the shade or wearing long sleeves, sun glasses (UVA+UVB protection) and wide brim hats (4-inch brim around the entire circumference of the hat) are also recommended for sun protection.  - Call for new or changing lesions.  Acrochordons (Skin Tags) - Fleshy, skin-colored pedunculated papules - Benign appearing.  - Observe. - If desired, they can be removed with an in office procedure that is not covered by insurance. - Please call the clinic if you notice any new or changing lesions.  Skin cancer screening performed today.  Return in about 1 year (around 11/09/2022) for TBSE - hx AK's .  Luther Redo, CMA, am acting as scribe for Sarina Ser, MD . Documentation: I have reviewed the above documentation for accuracy and completeness, and I agree with the above.  Sarina Ser, MD

## 2021-11-09 NOTE — Patient Instructions (Signed)

## 2021-11-12 ENCOUNTER — Encounter: Payer: Self-pay | Admitting: Dermatology

## 2022-07-05 ENCOUNTER — Other Ambulatory Visit: Payer: Self-pay | Admitting: Neurology

## 2022-07-05 DIAGNOSIS — F01518 Vascular dementia, unspecified severity, with other behavioral disturbance: Secondary | ICD-10-CM

## 2022-07-13 ENCOUNTER — Ambulatory Visit
Admission: RE | Admit: 2022-07-13 | Discharge: 2022-07-13 | Disposition: A | Discharge status: Home / Self Care | Payer: Medicare PPO | Source: Ambulatory Visit | Attending: Neurology | Admitting: Neurology

## 2022-07-13 DIAGNOSIS — F03918 Unspecified dementia, unspecified severity, with other behavioral disturbance: Secondary | ICD-10-CM | POA: Diagnosis not present

## 2022-07-13 DIAGNOSIS — F01518 Vascular dementia, unspecified severity, with other behavioral disturbance: Secondary | ICD-10-CM | POA: Diagnosis present

## 2022-11-10 ENCOUNTER — Ambulatory Visit: Payer: Medicare PPO | Admitting: Dermatology

## 2022-11-10 ENCOUNTER — Encounter: Payer: Self-pay | Admitting: Dermatology

## 2022-11-10 VITALS — BP 97/59 | HR 83

## 2022-11-10 DIAGNOSIS — Z1283 Encounter for screening for malignant neoplasm of skin: Secondary | ICD-10-CM | POA: Diagnosis not present

## 2022-11-10 DIAGNOSIS — L821 Other seborrheic keratosis: Secondary | ICD-10-CM

## 2022-11-10 DIAGNOSIS — D229 Melanocytic nevi, unspecified: Secondary | ICD-10-CM

## 2022-11-10 DIAGNOSIS — B353 Tinea pedis: Secondary | ICD-10-CM

## 2022-11-10 DIAGNOSIS — L814 Other melanin hyperpigmentation: Secondary | ICD-10-CM | POA: Diagnosis not present

## 2022-11-10 DIAGNOSIS — L578 Other skin changes due to chronic exposure to nonionizing radiation: Secondary | ICD-10-CM | POA: Diagnosis not present

## 2022-11-10 DIAGNOSIS — Z872 Personal history of diseases of the skin and subcutaneous tissue: Secondary | ICD-10-CM

## 2022-11-10 DIAGNOSIS — Z79899 Other long term (current) drug therapy: Secondary | ICD-10-CM

## 2022-11-10 DIAGNOSIS — L299 Pruritus, unspecified: Secondary | ICD-10-CM

## 2022-11-10 MED ORDER — KETOCONAZOLE 2 % EX CREA: 1.0000 | TOPICAL_CREAM | Freq: Every day | CUTANEOUS | 6 refills | Status: DC

## 2022-11-10 NOTE — Progress Notes (Signed)
Follow-Up Visit   Subjective  Anita Mercer is a 76 y.o. female who presents for the following: Total body skin exam (Hx of AKs) and Hx of Tinea Pedis (Bil feet, used Ketoconazole 2% cream a few times).feet itch.  Patient accompanied by husband who contributes to history.  The patient presents for Total-Body Skin Exam (TBSE) for skin cancer screening and mole check.  The patient has spots, moles and lesions to be evaluated, some may be new or changing and the patient has concerns that these could be cancer.   The following portions of the chart were reviewed this encounter and updated as appropriate:   Tobacco  Allergies  Meds  Problems  Med Hx  Surg Hx  Fam Hx     Review of Systems:  No other skin or systemic complaints except as noted in HPI or Assessment and Plan.  Objective  Well appearing patient in no apparent distress; mood and affect are within normal limits.  A full examination was performed including scalp, head, eyes, ears, nose, lips, neck, chest, axillae, abdomen, back, buttocks, bilateral upper extremities, bilateral lower extremities, hands, feet, fingers, toes, fingernails, and toenails. All findings within normal limits unless otherwise noted below.  bil feet Scaling bil feet   Assessment & Plan   Lentigines - Scattered tan macules - Due to sun exposure - Benign-appearing, observe - Recommend daily broad spectrum sunscreen SPF 30+ to sun-exposed areas, reapply every 2 hours as needed. - Call for any changes  Seborrheic Keratoses - Stuck-on, waxy, tan-brown papules and/or plaques  - Benign-appearing - Discussed benign etiology and prognosis. - Observe - Call for any changes - breast, trunk, arms  Melanocytic Nevi - Tan-brown and/or pink-flesh-colored symmetric macules and papules - Benign appearing on exam today - Observation - Call clinic for new or changing moles - Recommend daily use of broad spectrum spf 30+ sunscreen to sun-exposed  areas.   Hemangiomas - Red papules - Discussed benign nature - Observe - Call for any changes - trunk  Actinic Damage - Chronic condition, secondary to cumulative UV/sun exposure - diffuse scaly erythematous macules with underlying dyspigmentation - Recommend daily broad spectrum sunscreen SPF 30+ to sun-exposed areas, reapply every 2 hours as needed.  - Staying in the shade or wearing long sleeves, sun glasses (UVA+UVB protection) and wide brim hats (4-inch brim around the entire circumference of the hat) are also recommended for sun protection.  - Call for new or changing lesions.  Skin cancer screening performed today.   Tinea pedis of both feet With pruritus bil feet Chronic and persistent condition with duration or expected duration over one year. Condition is symptomatic / bothersome to patient. Not to goal. Restart Ketconazole 2% cr qhs to feet  ketoconazole (NIZORAL) 2 % cream - bil feet Apply 1 Application topically daily. Qhs to feet until clear  History of PreCancerous Actinic Keratosis  - site(s) of PreCancerous Actinic Keratosis clear today. - these may recur and new lesions may form requiring treatment to prevent transformation into skin cancer - observe for new or changing spots and contact [facility] for appointment if occur - photoprotection with sun protective clothing; sunglasses and broad spectrum sunscreen with SPF of at least 30 + and frequent self skin exams recommended - yearly exams by a dermatologist recommended for persons with history of PreCancerous Actinic Keratoses   Return in about 1 year (around 11/11/2023) for TBSE, Hx of AKs.  I, Sonya Hupman, RMA, am acting as  scribe for Sarina Ser, MD . Documentation: I have reviewed the above documentation for accuracy and completeness, and I agree with the above.  Sarina Ser, MD

## 2022-11-10 NOTE — Patient Instructions (Signed)
Due to recent changes in healthcare laws, you may see results of your pathology and/or laboratory studies on MyChart before the doctors have had a chance to review them. We understand that in some cases there may be results that are confusing or concerning to you. Please understand that not all results are received at the same time and often the doctors may need to interpret multiple results in order to provide you with the best plan of care or course of treatment. Therefore, we ask that you please give us 2 business days to thoroughly review all your results before contacting the office for clarification. Should we see a critical lab result, you will be contacted sooner.   If You Need Anything After Your Visit  If you have any questions or concerns for your doctor, please call our main line at [phone] and press option 4 to reach your doctor's medical assistant. If no one answers, please leave a voicemail as directed and we will return your call as soon as possible. Messages left after 4 pm will be answered the following business day.   You may also send us a message via MyChart. We typically respond to MyChart messages within 1-2 business days.  For prescription refills, please ask your pharmacy to contact our office. Our fax number is [phone].  If you have an urgent issue when the clinic is closed that cannot wait until the next business day, you can page your doctor at the number below.    Please note that while we do our best to be available for urgent issues outside of office hours, we are not available 24/7.   If you have an urgent issue and are unable to reach us, you may choose to seek medical care at your doctor's office, retail clinic, urgent care center, or emergency room.  If you have a medical emergency, please immediately call 911 or go to the emergency department.  Pager Numbers  - Dr. Kowalski: [phone]  - Dr. Moye: [phone]  - Dr. Stewart:  [phone]  In the event of inclement weather, please call our main line at [phone] for an update on the status of any delays or closures.  Dermatology Medication Tips: Please keep the boxes that topical medications come in in order to help keep track of the instructions about where and how to use these. Pharmacies typically print the medication instructions only on the boxes and not directly on the medication tubes.   If your medication is too expensive, please contact our office at [phone] option 4 or send us a message through MyChart.   We are unable to tell what your co-pay for medications will be in advance as this is different depending on your insurance coverage. However, we may be able to find a substitute medication at lower cost or fill out paperwork to get insurance to cover a needed medication.   If a prior authorization is required to get your medication covered by your insurance company, please allow us 1-2 business days to complete this process.  Drug prices often vary depending on where the prescription is filled and some pharmacies may offer cheaper prices.  The website www.goodrx.com contains coupons for medications through different pharmacies. The prices here do not account for what the cost may be with help from insurance (it may be cheaper with your insurance), but the website can give you the price if you did not use any insurance.  - You can print the associated coupon and take it with   your prescription to the pharmacy.  - You may also stop by our office during regular business hours and pick up a GoodRx coupon card.  - If you need your prescription sent electronically to a different pharmacy, notify our office through [facility] MyChart or by phone at [phone] option 4.     Si Usted Necesita Algo Despus de Su Visita  Tambin puede enviarnos un mensaje a travs de MyChart. Por lo general respondemos a los mensajes de MyChart en el transcurso de 1 a 2  das hbiles.  Para renovar recetas, por favor pida a su farmacia que se ponga en contacto con nuestra oficina. Nuestro nmero de fax es el [phone].  Si tiene un asunto urgente cuando la clnica est cerrada y que no puede esperar hasta el siguiente da hbil, puede llamar/localizar a su doctor(a) al nmero que aparece a continuacin.   Por favor, tenga en cuenta que aunque hacemos todo lo posible para estar disponibles para asuntos urgentes fuera del horario de oficina, no estamos disponibles las 24 horas del da, los 7 das de la semana.   Si tiene un problema urgente y no puede comunicarse con nosotros, puede optar por buscar atencin mdica  en el consultorio de su doctor(a), en una clnica privada, en un centro de atencin urgente o en una sala de emergencias.  Si tiene una emergencia mdica, por favor llame inmediatamente al 911 o vaya a la sala de emergencias.  Nmeros de bper  - Dr. Kowalski: [phone]  - Dra. Moye: [phone]  - Dra. Stewart: [phone]  En caso de inclemencias del tiempo, por favor llame a nuestra lnea principal al [phone] para una actualizacin sobre el estado de cualquier retraso o cierre.  Consejos para la medicacin en dermatologa: Por favor, guarde las cajas en las que vienen los medicamentos de uso tpico para ayudarle a seguir las instrucciones sobre dnde y cmo usarlos. Las farmacias generalmente imprimen las instrucciones del medicamento slo en las cajas y no directamente en los tubos del medicamento.   Si su medicamento es muy caro, por favor, pngase en contacto con nuestra oficina llamando al [phone] y presione la opcin 4 o envenos un mensaje a travs de MyChart.   No podemos decirle cul ser su copago por los medicamentos por adelantado ya que esto es diferente dependiendo de la cobertura de su seguro. Sin embargo, es posible que podamos encontrar un medicamento sustituto a menor costo o llenar un formulario para que el  seguro cubra el medicamento que se considera necesario.   Si se requiere una autorizacin previa para que su compaa de seguros cubra su medicamento, por favor permtanos de 1 a 2 das hbiles para completar este proceso.  Los precios de los medicamentos varan con frecuencia dependiendo del lugar de dnde se surte la receta y alguna farmacias pueden ofrecer precios ms baratos.  El sitio web www.goodrx.com tiene cupones para medicamentos de diferentes farmacias. Los precios aqu no tienen en cuenta lo que podra costar con la ayuda del seguro (puede ser ms barato con su seguro), pero el sitio web puede darle el precio si no utiliz ningn seguro.  - Puede imprimir el cupn correspondiente y llevarlo con su receta a la farmacia.  - Tambin puede pasar por nuestra oficina durante el horario de atencin regular y recoger una tarjeta de cupones de GoodRx.  - Si necesita que su receta se enve electrnicamente a una farmacia diferente, informe a nuestra oficina a travs de MyChart de [place]   o por telfono llamando al [phone] y presione la opcin 4.  

## 2022-11-20 ENCOUNTER — Encounter: Payer: Self-pay | Admitting: Dermatology

## 2023-08-25 ENCOUNTER — Other Ambulatory Visit: Payer: Self-pay | Admitting: Gastroenterology

## 2023-08-25 DIAGNOSIS — K219 Gastro-esophageal reflux disease without esophagitis: Secondary | ICD-10-CM

## 2023-08-25 DIAGNOSIS — R1084 Generalized abdominal pain: Secondary | ICD-10-CM

## 2023-08-25 DIAGNOSIS — R634 Abnormal weight loss: Secondary | ICD-10-CM

## 2023-08-29 ENCOUNTER — Ambulatory Visit
Admission: RE | Admit: 2023-08-29 | Discharge: 2023-08-29 | Disposition: A | Discharge status: Home / Self Care | Payer: Medicare PPO | Source: Ambulatory Visit | Attending: Gastroenterology | Admitting: Gastroenterology

## 2023-08-29 DIAGNOSIS — R634 Abnormal weight loss: Secondary | ICD-10-CM | POA: Insufficient documentation

## 2023-08-29 DIAGNOSIS — R1084 Generalized abdominal pain: Secondary | ICD-10-CM | POA: Diagnosis present

## 2023-08-29 DIAGNOSIS — K219 Gastro-esophageal reflux disease without esophagitis: Secondary | ICD-10-CM | POA: Insufficient documentation

## 2023-08-29 LAB — POCT I-STAT CREATININE: Creatinine, Ser: 0.8 mg/dL (ref 0.44–1.00)

## 2023-08-29 MED ORDER — IOHEXOL 300 MG/ML  SOLN
100.0000 mL | Freq: Once | INTRAMUSCULAR | Status: AC | PRN
  Administered 2023-08-29: 100 mL via INTRAVENOUS

## 2023-11-07 ENCOUNTER — Other Ambulatory Visit: Payer: Self-pay | Admitting: Student

## 2023-11-07 DIAGNOSIS — R519 Headache, unspecified: Secondary | ICD-10-CM

## 2023-11-07 DIAGNOSIS — H53149 Visual discomfort, unspecified: Secondary | ICD-10-CM

## 2023-11-07 DIAGNOSIS — F40298 Other specified phobia: Secondary | ICD-10-CM

## 2023-11-10 ENCOUNTER — Ambulatory Visit: Payer: Medicare PPO | Admitting: Dermatology

## 2023-11-10 DIAGNOSIS — W908XXA Exposure to other nonionizing radiation, initial encounter: Secondary | ICD-10-CM

## 2023-11-10 DIAGNOSIS — L578 Other skin changes due to chronic exposure to nonionizing radiation: Secondary | ICD-10-CM | POA: Diagnosis not present

## 2023-11-10 DIAGNOSIS — R52 Pain, unspecified: Secondary | ICD-10-CM

## 2023-11-10 DIAGNOSIS — D1801 Hemangioma of skin and subcutaneous tissue: Secondary | ICD-10-CM

## 2023-11-10 DIAGNOSIS — Z79899 Other long term (current) drug therapy: Secondary | ICD-10-CM

## 2023-11-10 DIAGNOSIS — M533 Sacrococcygeal disorders, not elsewhere classified: Secondary | ICD-10-CM

## 2023-11-10 DIAGNOSIS — Z1283 Encounter for screening for malignant neoplasm of skin: Secondary | ICD-10-CM

## 2023-11-10 DIAGNOSIS — L820 Inflamed seborrheic keratosis: Secondary | ICD-10-CM

## 2023-11-10 DIAGNOSIS — B353 Tinea pedis: Secondary | ICD-10-CM

## 2023-11-10 DIAGNOSIS — L821 Other seborrheic keratosis: Secondary | ICD-10-CM

## 2023-11-10 DIAGNOSIS — D229 Melanocytic nevi, unspecified: Secondary | ICD-10-CM

## 2023-11-10 DIAGNOSIS — L299 Pruritus, unspecified: Secondary | ICD-10-CM

## 2023-11-10 DIAGNOSIS — L814 Other melanin hyperpigmentation: Secondary | ICD-10-CM | POA: Diagnosis not present

## 2023-11-10 DIAGNOSIS — Z7189 Other specified counseling: Secondary | ICD-10-CM

## 2023-11-10 NOTE — Progress Notes (Signed)
Follow-Up Visit   Subjective  Anita Mercer is a 77 y.o. female who presents for the following: Skin Cancer Screening and Full Body Skin Exam No history of skin cancer Hx of aks,  Hx of tinea pedis both feet using ketoconazole cream.   Husband reports painful area at patient's coccyx area today. He would like checked.   Husband is with patient and contributes to history.  The patient presents for Total-Body Skin Exam (TBSE) for skin cancer screening and mole check. The patient has spots, moles and lesions to be evaluated, some may be new or changing and the patient may have concern these could be cancer.  The following portions of the chart were reviewed this encounter and updated as appropriate: medications, allergies, medical history  Review of Systems:  No other skin or systemic complaints except as noted in HPI or Assessment and Plan.  Objective  Well appearing patient in no apparent distress; mood and affect are within normal limits.  A full examination was performed including scalp, head, eyes, ears, nose, lips, neck, chest, axillae, abdomen, back, buttocks, bilateral upper extremities, bilateral lower extremities, hands, feet, fingers, toes, fingernails, and toenails. All findings within normal limits unless otherwise noted below.   Relevant physical exam findings are noted in the Assessment and Plan.  left cheek x 1 Erythematous stuck-on, waxy papule or plaque  Assessment & Plan   SKIN CANCER SCREENING PERFORMED TODAY.  ACTINIC DAMAGE - Chronic condition, secondary to cumulative UV/sun exposure - diffuse scaly erythematous macules with underlying dyspigmentation - Recommend daily broad spectrum sunscreen SPF 30+ to sun-exposed areas, reapply every 2 hours as needed.  - Staying in the shade or wearing long sleeves, sun glasses (UVA+UVB protection) and wide brim hats (4-inch brim around the entire circumference of the hat) are also recommended for sun protection.   - Call for new or changing lesions.  LENTIGINES, SEBORRHEIC KERATOSES, HEMANGIOMAS - Benign normal skin lesions - Benign-appearing - Call for any changes  MELANOCYTIC NEVI - Tan-brown and/or pink-flesh-colored symmetric macules and papules - Benign appearing on exam today - Observation - Call clinic for new or changing moles - Recommend daily use of broad spectrum spf 30+ sunscreen to sun-exposed areas.   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 Condition is symptomatic / bothersome to patient. Not to goal.  Continue  Ketconazole 2% cr qhs to feet  Pain coccycx bone  Exam - clear, skin appears normal, no sign or rash/ulcer, no sign of infection. No bruising.  Plan:  Recommend follow up with PCP if pain doesn't go away. Could be caused from injury.  Skin appears normal, no sign of infection. Do not recommend any treatment other than follow-up with PCP for further problems.  INFLAMED SEBORRHEIC KERATOSIS left cheek x 1 Symptomatic, irritating, patient would like treated. Destruction of lesion - left cheek x 1 Complexity: simple   Destruction method: cryotherapy   Informed consent: discussed and consent obtained   Timeout:  patient name, date of birth, surgical site, and procedure verified Lesion destroyed using liquid nitrogen: Yes   Region frozen until ice ball extended beyond lesion: Yes   Outcome: patient tolerated procedure well with no complications   Post-procedure details: wound care instructions given   Return for 1 - 2 year .  IAsher Muir, CMA, am acting as scribe for Armida Sans, MD.  Documentation: I have reviewed the above documentation for accuracy and completeness, and I agree with the above.  Armida Sans, MD

## 2023-11-10 NOTE — Patient Instructions (Addendum)
If continue having some soreness at coccyx bone recommend talking to primary care Skin appears normal no bruising, no rash, no tears, no ulcer  Cryotherapy Aftercare  Wash gently with soap and water everyday.   Apply Vaseline and Band-Aid daily until healed.   Seborrheic Keratosis  What causes seborrheic keratoses? Seborrheic keratoses are harmless, common skin growths that first appear during adult life.  As time goes by, more growths appear.  Some people may develop a large number of them.  Seborrheic keratoses appear on both covered and uncovered body parts.  They are not caused by sunlight.  The tendency to develop seborrheic keratoses can be inherited.  They vary in color from skin-colored to gray, brown, or even black.  They can be either smooth or have a rough, warty surface.   Seborrheic keratoses are superficial and look as if they were stuck on the skin.  Under the microscope this type of keratosis looks like layers upon layers of skin.  That is why at times the top layer may seem to fall off, but the rest of the growth remains and re-grows.    Treatment Seborrheic keratoses do not need to be treated, but can easily be removed in the office.  Seborrheic keratoses often cause symptoms when they rub on clothing or jewelry.  Lesions can be in the way of shaving.  If they become inflamed, they can cause itching, soreness, or burning.  Removal of a seborrheic keratosis can be accomplished by freezing, burning, or surgery. If any spot bleeds, scabs, or grows rapidly, please return to have it checked, as these can be an indication of a skin cancer.   Melanoma ABCDEs  Melanoma is the most dangerous type of skin cancer, and is the leading cause of death from skin disease.  You are more likely to develop melanoma if you: Have light-colored skin, light-colored eyes, or red or blond hair Spend a lot of time in the sun Tan regularly, either outdoors or in a tanning bed Have had blistering  sunburns, especially during childhood Have a close family member who has had a melanoma Have atypical moles or large birthmarks  Early detection of melanoma is key since treatment is typically straightforward and cure rates are extremely high if we catch it early.   The first sign of melanoma is often a change in a mole or a new dark spot.  The ABCDE system is a way of remembering the signs of melanoma.  A for asymmetry:  The two halves do not match. B for border:  The edges of the growth are irregular. C for color:  A mixture of colors are present instead of an even brown color. D for diameter:  Melanomas are usually (but not always) greater than 6mm - the size of a pencil eraser. E for evolution:  The spot keeps changing in size, shape, and color.  Please check your skin once per month between visits. You can use a small mirror in front and a large mirror behind you to keep an eye on the back side or your body.   If you see any new or changing lesions before your next follow-up, please call to schedule a visit.  Please continue daily skin protection including broad spectrum sunscreen SPF 30+ to sun-exposed areas, reapplying every 2 hours as needed when you're outdoors.   Staying in the shade or wearing long sleeves, sun glasses (UVA+UVB protection) and wide brim hats (4-inch brim around the entire circumference of the hat)  are also recommended for sun protection.    Due to recent changes in healthcare laws, you may see results of your pathology and/or laboratory studies on MyChart before the doctors have had a chance to review them. We understand that in some cases there may be results that are confusing or concerning to you. Please understand that not all results are received at the same time and often the doctors may need to interpret multiple results in order to provide you with the best plan of care or course of treatment. Therefore, we ask that you please give [place] 2 business days to  thoroughly review all your results before contacting the office for clarification. Should we see a critical lab result, you will be contacted sooner.   If You Need Anything After Your Visit  If you have any questions or concerns for your doctor, please call our main line at [phone] and press option 4 to reach your doctor's medical assistant. If no one answers, please leave a voicemail as directed and we will return your call as soon as possible. Messages left after 4 pm will be answered the following business day.   You may also send [place] a message via MyChart. We typically respond to MyChart messages within 1-2 business days.  For prescription refills, please ask your pharmacy to contact our office. Our fax number is [phone].  If you have an urgent issue when the clinic is closed that cannot wait until the next business day, you can page your doctor at the number below.    Please note that while we do our best to be available for urgent issues outside of office hours, we are not available 24/7.   If you have an urgent issue and are unable to reach [place], you may choose to seek medical care at your doctor's office, retail clinic, urgent care center, or emergency room.  If you have a medical emergency, please immediately call 911 or go to the emergency department.  Pager Numbers  - Dr. Gwen Pounds: [phone]  - Dr. Roseanne Reno: [phone]  - Dr. Katrinka Blazing: [phone]   In the event of inclement weather, please call our main line at [phone] for an update on the status of any delays or closures.  Dermatology Medication Tips: Please keep the boxes that topical medications come in in order to help keep track of the instructions about where and how to use these. Pharmacies typically print the medication instructions only on the boxes and not directly on the medication tubes.   If your medication is too expensive, please contact our office at [phone] option 4 or send [place] a message  through MyChart.   We are unable to tell what your co-pay for medications will be in advance as this is different depending on your insurance coverage. However, we may be able to find a substitute medication at lower cost or fill out paperwork to get insurance to cover a needed medication.   If a prior authorization is required to get your medication covered by your insurance company, please allow [place] 1-2 business days to complete this process.  Drug prices often vary depending on where the prescription is filled and some pharmacies may offer cheaper prices.  The website www.goodrx.com contains coupons for medications through different pharmacies. The prices here do not account for what the cost may be with help from insurance (it may be cheaper with your insurance), but the website can give you the price if you did not use any insurance.  -  You can print the associated coupon and take it with your prescription to the pharmacy.  - You may also stop by our office during regular business hours and pick up a GoodRx coupon card.  - If you need your prescription sent electronically to a different pharmacy, notify our office through [facility] or by phone at [phone] option 4.     Si Usted Necesita Algo Despus de Su Visita  Tambin puede enviarnos un mensaje a travs de Clinical cytogeneticist. Por lo general respondemos a los mensajes de MyChart en el transcurso de 1 a 2 das hbiles.  Para renovar recetas, por favor pida a su farmacia que se ponga en contacto con nuestra oficina. Annie Sable de fax es [place] [phone].  Si tiene un asunto urgente cuando la clnica est cerrada y que no puede esperar hasta el siguiente da hbil, puede llamar/localizar a su doctor(a) al nmero que aparece a continuacin.   Por favor, tenga en cuenta que aunque hacemos todo lo posible para estar disponibles para asuntos urgentes fuera del horario de [place], no estamos disponibles las 24 horas del da, los 7 [address]  [address] de  la [place].   Si tiene un problema urgente y no puede comunicarse con nosotros, puede optar por buscar atencin mdica  en el consultorio de su doctor(a), en una clnica privada, en un centro de atencin urgente o en una sala de emergencias.  Si tiene Engineer, drilling, por favor llame inmediatamente al 911 o vaya a la sala de emergencias.  Nmeros de bper  - Dr. Gwen Pounds: [phone]  - Dra. Roseanne Reno: [phone]  - Dr. Katrinka Blazing: [phone]   En caso de inclemencias del tiempo, por favor llame a Lacy Duverney principal al [phone] para una actualizacin sobre el [place] de cualquier retraso o cierre.  Consejos para la medicacin en dermatologa: Por favor, guarde las cajas en las que vienen los medicamentos de uso tpico para ayudarle a seguir las instrucciones sobre dnde y cmo usarlos. Las farmacias generalmente imprimen las instrucciones del medicamento slo en las cajas y no directamente en los tubos del [place].   Si su medicamento es muy caro, por favor, pngase en contacto con Rolm [place]ala llamando al [phone] y presione la opcin 4 o envenos un mensaje a travs de Clinical cytogeneticist.   No podemos decirle cul ser su copago por los medicamentos por adelantado ya que esto es diferente dependiendo de la cobertura de su seguro. Sin embargo, es posible que podamos encontrar un medicamento sustituto a Audiological scientist un formulario para que el seguro cubra el medicamento que se considera necesario.   Si se requiere una autorizacin previa para que su compaa de seguros [place] su medicamento, por favor permtanos de 1 a 2 das hbiles para completar [address]  Los precios de los medicamentos varan con frecuencia dependiendo del Environmental consultant de dnde se surte la receta y alguna farmacias pueden ofrecer precios ms baratos.  El sitio web www.goodrx.com tiene cupones para medicamentos de Health and safety inspector. Los precios aqu no tienen en cuenta lo que podra costar con la ayuda  del seguro (puede ser ms barato con su seguro), pero el sitio web puede darle el precio si no utiliz Tourist information centre manager.  - Puede imprimir el cupn correspondiente y llevarlo con su receta a la farmacia.  - Tambin puede pasar por nuestra oficina durante el horario de atencin regular y Education officer, museum una tarjeta de cupones de GoodRx.  - Si necesita que su receta se enve electrnicamente a [place] [employer],  informe a nuestra oficina a travs de MyChart de [place] o por telfono llamando al [phone] y presione la opcin 4.

## 2023-11-20 ENCOUNTER — Encounter: Payer: Self-pay | Admitting: Dermatology

## 2023-11-22 ENCOUNTER — Encounter: Payer: Self-pay | Admitting: Student

## 2023-11-28 ENCOUNTER — Other Ambulatory Visit: Payer: Medicare PPO

## 2023-12-14 ENCOUNTER — Inpatient Hospital Stay: Admission: RE | Admit: 2023-12-14 | Payer: Medicare PPO | Source: Ambulatory Visit

## 2023-12-27 ENCOUNTER — Ambulatory Visit
Admission: RE | Admit: 2023-12-27 | Discharge: 2023-12-27 | Disposition: A | Discharge status: Home / Self Care | Payer: Medicare PPO | Source: Ambulatory Visit | Attending: Student | Admitting: Student

## 2023-12-27 DIAGNOSIS — R519 Headache, unspecified: Secondary | ICD-10-CM

## 2023-12-27 DIAGNOSIS — H53149 Visual discomfort, unspecified: Secondary | ICD-10-CM

## 2023-12-27 DIAGNOSIS — F40298 Other specified phobia: Secondary | ICD-10-CM

## 2023-12-27 MED ORDER — GADOPICLENOL 0.5 MMOL/ML IV SOLN
6.0000 mL | Freq: Once | INTRAVENOUS | Status: AC | PRN
  Administered 2023-12-27: 6 mL via INTRAVENOUS

## 2024-08-30 ENCOUNTER — Inpatient Hospital Stay

## 2024-08-30 ENCOUNTER — Other Ambulatory Visit: Payer: Self-pay

## 2024-08-30 ENCOUNTER — Emergency Department

## 2024-08-30 ENCOUNTER — Encounter: Payer: Self-pay | Admitting: Emergency Medicine

## 2024-08-30 ENCOUNTER — Inpatient Hospital Stay
Admission: EM | Admit: 2024-08-30 | Discharge: 2024-09-08 | DRG: 374 | Disposition: A | Discharge status: Hospice | Attending: Family Medicine | Admitting: Family Medicine

## 2024-08-30 DIAGNOSIS — G709 Myoneural disorder, unspecified: Secondary | ICD-10-CM | POA: Diagnosis present

## 2024-08-30 DIAGNOSIS — Z515 Encounter for palliative care: Secondary | ICD-10-CM | POA: Diagnosis not present

## 2024-08-30 DIAGNOSIS — C269 Malignant neoplasm of ill-defined sites within the digestive system: Secondary | ICD-10-CM | POA: Diagnosis present

## 2024-08-30 DIAGNOSIS — N179 Acute kidney failure, unspecified: Secondary | ICD-10-CM | POA: Diagnosis present

## 2024-08-30 DIAGNOSIS — E1142 Type 2 diabetes mellitus with diabetic polyneuropathy: Secondary | ICD-10-CM | POA: Diagnosis present

## 2024-08-30 DIAGNOSIS — M659 Unspecified synovitis and tenosynovitis, unspecified site: Secondary | ICD-10-CM | POA: Diagnosis present

## 2024-08-30 DIAGNOSIS — Z888 Allergy status to other drugs, medicaments and biological substances status: Secondary | ICD-10-CM

## 2024-08-30 DIAGNOSIS — E11649 Type 2 diabetes mellitus with hypoglycemia without coma: Secondary | ICD-10-CM | POA: Diagnosis not present

## 2024-08-30 DIAGNOSIS — E876 Hypokalemia: Secondary | ICD-10-CM | POA: Diagnosis present

## 2024-08-30 DIAGNOSIS — R339 Retention of urine, unspecified: Secondary | ICD-10-CM | POA: Diagnosis present

## 2024-08-30 DIAGNOSIS — R188 Other ascites: Secondary | ICD-10-CM | POA: Diagnosis present

## 2024-08-30 DIAGNOSIS — K567 Ileus, unspecified: Secondary | ICD-10-CM | POA: Diagnosis not present

## 2024-08-30 DIAGNOSIS — R630 Anorexia: Secondary | ICD-10-CM | POA: Diagnosis present

## 2024-08-30 DIAGNOSIS — G9341 Metabolic encephalopathy: Secondary | ICD-10-CM | POA: Diagnosis present

## 2024-08-30 DIAGNOSIS — E7849 Other hyperlipidemia: Secondary | ICD-10-CM | POA: Diagnosis present

## 2024-08-30 DIAGNOSIS — K5669 Other partial intestinal obstruction: Secondary | ICD-10-CM | POA: Diagnosis present

## 2024-08-30 DIAGNOSIS — Z794 Long term (current) use of insulin: Secondary | ICD-10-CM | POA: Diagnosis not present

## 2024-08-30 DIAGNOSIS — F0390 Unspecified dementia without behavioral disturbance: Secondary | ICD-10-CM | POA: Diagnosis present

## 2024-08-30 DIAGNOSIS — E870 Hyperosmolality and hypernatremia: Secondary | ICD-10-CM | POA: Diagnosis present

## 2024-08-30 DIAGNOSIS — F05 Delirium due to known physiological condition: Secondary | ICD-10-CM | POA: Diagnosis present

## 2024-08-30 DIAGNOSIS — Z7982 Long term (current) use of aspirin: Secondary | ICD-10-CM

## 2024-08-30 DIAGNOSIS — G250 Essential tremor: Secondary | ICD-10-CM | POA: Diagnosis not present

## 2024-08-30 DIAGNOSIS — C786 Secondary malignant neoplasm of retroperitoneum and peritoneum: Principal | ICD-10-CM | POA: Diagnosis present

## 2024-08-30 DIAGNOSIS — I10 Essential (primary) hypertension: Secondary | ICD-10-CM | POA: Diagnosis present

## 2024-08-30 DIAGNOSIS — K529 Noninfective gastroenteritis and colitis, unspecified: Principal | ICD-10-CM | POA: Diagnosis present

## 2024-08-30 DIAGNOSIS — A09 Infectious gastroenteritis and colitis, unspecified: Secondary | ICD-10-CM | POA: Diagnosis present

## 2024-08-30 DIAGNOSIS — Z66 Do not resuscitate: Secondary | ICD-10-CM | POA: Diagnosis not present

## 2024-08-30 DIAGNOSIS — Z789 Other specified health status: Secondary | ICD-10-CM | POA: Diagnosis not present

## 2024-08-30 DIAGNOSIS — F03C0 Unspecified dementia, severe, without behavioral disturbance, psychotic disturbance, mood disturbance, and anxiety: Secondary | ICD-10-CM | POA: Diagnosis not present

## 2024-08-30 DIAGNOSIS — K3189 Other diseases of stomach and duodenum: Secondary | ICD-10-CM | POA: Diagnosis present

## 2024-08-30 DIAGNOSIS — Z7984 Long term (current) use of oral hypoglycemic drugs: Secondary | ICD-10-CM | POA: Diagnosis not present

## 2024-08-30 DIAGNOSIS — K2100 Gastro-esophageal reflux disease with esophagitis, without bleeding: Secondary | ICD-10-CM | POA: Diagnosis present

## 2024-08-30 DIAGNOSIS — E1169 Type 2 diabetes mellitus with other specified complication: Secondary | ICD-10-CM | POA: Diagnosis present

## 2024-08-30 DIAGNOSIS — K920 Hematemesis: Secondary | ICD-10-CM | POA: Diagnosis present

## 2024-08-30 DIAGNOSIS — Z90710 Acquired absence of both cervix and uterus: Secondary | ICD-10-CM

## 2024-08-30 DIAGNOSIS — Z882 Allergy status to sulfonamides status: Secondary | ICD-10-CM

## 2024-08-30 DIAGNOSIS — Z79899 Other long term (current) drug therapy: Secondary | ICD-10-CM

## 2024-08-30 DIAGNOSIS — Z711 Person with feared health complaint in whom no diagnosis is made: Secondary | ICD-10-CM | POA: Diagnosis not present

## 2024-08-30 DIAGNOSIS — Z7189 Other specified counseling: Secondary | ICD-10-CM | POA: Diagnosis not present

## 2024-08-30 LAB — URINALYSIS, ROUTINE W REFLEX MICROSCOPIC
Bilirubin Urine: NEGATIVE
Glucose, UA: NEGATIVE mg/dL
Hgb urine dipstick: NEGATIVE
Ketones, ur: NEGATIVE mg/dL
Nitrite: NEGATIVE
Protein, ur: 30 mg/dL — AB
Specific Gravity, Urine: 1.028 (ref 1.005–1.030)
pH: 5 (ref 5.0–8.0)

## 2024-08-30 LAB — COMPREHENSIVE METABOLIC PANEL WITH GFR
ALT: 14 U/L (ref 0–44)
AST: 41 U/L (ref 15–41)
Albumin: 3.7 g/dL (ref 3.5–5.0)
Alkaline Phosphatase: 67 U/L (ref 38–126)
Anion gap: 16 — ABNORMAL HIGH (ref 5–15)
BUN: 35 mg/dL — ABNORMAL HIGH (ref 8–23)
CO2: 26 mmol/L (ref 22–32)
Calcium: 9.3 mg/dL (ref 8.9–10.3)
Chloride: 98 mmol/L (ref 98–111)
Creatinine, Ser: 1.11 mg/dL — ABNORMAL HIGH (ref 0.44–1.00)
GFR, Estimated: 51 mL/min — ABNORMAL LOW (ref >60)
Glucose, Bld: 94 mg/dL (ref 70–99)
Potassium: 4.4 mmol/L (ref 3.5–5.1)
Sodium: 140 mmol/L (ref 135–145)
Total Bilirubin: 1.1 mg/dL (ref 0.0–1.2)
Total Protein: 7.8 g/dL (ref 6.5–8.1)

## 2024-08-30 LAB — CBC WITH DIFFERENTIAL/PLATELET
Abs Immature Granulocytes: 0.05 K/uL (ref 0.00–0.07)
Basophils Absolute: 0 K/uL (ref 0.0–0.1)
Basophils Relative: 0 %
Eosinophils Absolute: 0 K/uL (ref 0.0–0.5)
Eosinophils Relative: 0 %
HCT: 52.6 % — ABNORMAL HIGH (ref 36.0–46.0)
Hemoglobin: 16.1 g/dL — ABNORMAL HIGH (ref 12.0–15.0)
Immature Granulocytes: 0 %
Lymphocytes Relative: 7 %
Lymphs Abs: 0.8 K/uL (ref 0.7–4.0)
MCH: 26.7 pg (ref 26.0–34.0)
MCHC: 30.6 g/dL (ref 30.0–36.0)
MCV: 87.2 fL (ref 80.0–100.0)
Monocytes Absolute: 0.6 K/uL (ref 0.1–1.0)
Monocytes Relative: 6 %
Neutro Abs: 9.9 K/uL — ABNORMAL HIGH (ref 1.7–7.7)
Neutrophils Relative %: 87 %
Platelets: 538 K/uL — ABNORMAL HIGH (ref 150–400)
RBC: 6.03 MIL/uL — ABNORMAL HIGH (ref 3.87–5.11)
RDW: 13.6 % (ref 11.5–15.5)
WBC: 11.4 K/uL — ABNORMAL HIGH (ref 4.0–10.5)
nRBC: 0 % (ref 0.0–0.2)

## 2024-08-30 LAB — HEMOGLOBIN A1C
Hgb A1c MFr Bld: 6.7 % — ABNORMAL HIGH (ref 4.8–5.6)
Mean Plasma Glucose: 145.59 mg/dL

## 2024-08-30 LAB — PROTIME-INR
INR: 1 (ref 0.8–1.2)
Prothrombin Time: 13.8 s (ref 11.4–15.2)

## 2024-08-30 LAB — GLUCOSE, CAPILLARY
Glucose-Capillary: 61 mg/dL — ABNORMAL LOW (ref 70–99)
Glucose-Capillary: 59 mg/dL — ABNORMAL LOW (ref 70–99)

## 2024-08-30 MED ORDER — METRONIDAZOLE 500 MG/100ML IV SOLN
500.0000 mg | Freq: Two times a day (BID) | INTRAVENOUS | Status: DC
  Administered 2024-08-31 – 2024-09-07 (×15): 500 mg via INTRAVENOUS
  Filled 2024-08-30 (×16): qty 100

## 2024-08-30 MED ORDER — ONDANSETRON HCL 4 MG/2ML IJ SOLN
4.0000 mg | Freq: Once | INTRAMUSCULAR | Status: AC
  Administered 2024-08-30: 4 mg via INTRAVENOUS
  Filled 2024-08-30: qty 2

## 2024-08-30 MED ORDER — DIAZEPAM 5 MG/ML IJ SOLN
5.0000 mg | Freq: Once | INTRAMUSCULAR | Status: AC
  Administered 2024-08-30: 5 mg via INTRAVENOUS
  Filled 2024-08-30: qty 2

## 2024-08-30 MED ORDER — PIPERACILLIN-TAZOBACTAM 3.375 G IVPB 30 MIN
3.3750 g | Freq: Once | INTRAVENOUS | Status: AC
  Administered 2024-08-30: 3.375 g via INTRAVENOUS
  Filled 2024-08-30: qty 50

## 2024-08-30 MED ORDER — INSULIN ASPART 100 UNIT/ML IJ SOLN: 0.0000 [IU] | Freq: Every day | INTRAMUSCULAR | Status: DC

## 2024-08-30 MED ORDER — LACTATED RINGERS IV SOLN: INTRAVENOUS | Status: DC

## 2024-08-30 MED ORDER — DEXTROSE IN LACTATED RINGERS 5 % IV SOLN: INTRAVENOUS | Status: DC

## 2024-08-30 MED ORDER — ONDANSETRON HCL 4 MG/2ML IJ SOLN
4.0000 mg | Freq: Four times a day (QID) | INTRAMUSCULAR | Status: DC | PRN
  Administered 2024-09-03 – 2024-09-04 (×2): 4 mg via INTRAVENOUS
  Filled 2024-08-30 (×2): qty 2

## 2024-08-30 MED ORDER — MORPHINE SULFATE (PF) 2 MG/ML IV SOLN
2.0000 mg | INTRAVENOUS | Status: DC | PRN
  Administered 2024-08-30 – 2024-09-07 (×21): 2 mg via INTRAVENOUS
  Filled 2024-08-30 (×22): qty 1

## 2024-08-30 MED ORDER — DIAZEPAM 5 MG/ML IJ SOLN
5.0000 mg | INTRAMUSCULAR | Status: DC | PRN
  Administered 2024-08-31 – 2024-09-04 (×5): 5 mg via INTRAVENOUS
  Filled 2024-08-30 (×6): qty 2

## 2024-08-30 MED ORDER — INSULIN ASPART 100 UNIT/ML IJ SOLN: 0.0000 [IU] | Freq: Three times a day (TID) | INTRAMUSCULAR | Status: DC

## 2024-08-30 MED ORDER — SODIUM CHLORIDE 0.9 % IV SOLN
2.0000 g | INTRAVENOUS | Status: DC
  Administered 2024-08-31 – 2024-09-07 (×8): 2 g via INTRAVENOUS
  Filled 2024-08-30 (×9): qty 20

## 2024-08-30 MED ORDER — IOHEXOL 300 MG/ML  SOLN
100.0000 mL | Freq: Once | INTRAMUSCULAR | Status: AC | PRN
  Administered 2024-08-30: 80 mL via INTRAVENOUS

## 2024-08-30 MED ORDER — MORPHINE SULFATE (PF) 4 MG/ML IV SOLN
4.0000 mg | Freq: Once | INTRAVENOUS | Status: AC
  Administered 2024-08-30: 4 mg via INTRAVENOUS
  Filled 2024-08-30: qty 1

## 2024-08-30 MED ORDER — ONDANSETRON HCL 4 MG PO TABS: 4.0000 mg | ORAL_TABLET | Freq: Four times a day (QID) | ORAL | Status: DC | PRN

## 2024-08-30 MED ORDER — LACTATED RINGERS IV BOLUS
1000.0000 mL | Freq: Once | INTRAVENOUS | Status: AC
  Administered 2024-08-30: 1000 mL via INTRAVENOUS

## 2024-08-30 NOTE — ED Provider Notes (Signed)
 [facility] Provider Note    Event Date/Time   First MD Initiated Contact with Patient 08/30/24 1516     (approximate)   History   Chief Complaint Emesis and Abdominal Pain   HPI  Anita Mercer is a 78 y.o. female with past medical history of hypertension, hyperlipidemia, diabetes, and GERD who presents to the ED complaining of abdominal pain.  Patient reports dealing with increasing pain across her lower abdomen for the past 2 weeks with decreased appetite and decreased oral intake.  She has been constipated since this time and has noticed increasing distention of her abdomen.  She has been feeling nauseous at times but did not vomit until today just prior to arrival.  She describes brown vomit with some streaks of blood, but has not noticed any blood in her stool or dark tarry stool recently.  She denies any difficulty urinating and has not had any fevers or dysuria.  She does not take a blood thinner other than every other day baby aspirin.  She denies NSAID or steroid use, does not drink alcohol.     Physical Exam   Triage Vital Signs: ED Triage Vitals  Encounter Vitals Group     BP 08/30/24 1518 (!) 142/81     Girls Systolic BP Percentile --      Girls Diastolic BP Percentile --      Boys Systolic BP Percentile --      Boys Diastolic BP Percentile --      Pulse Rate 08/30/24 1518 (!) 114     Resp 08/30/24 1518 18     Temp 08/30/24 1518 (!) 97.5 F (36.4 C)     Temp Source 08/30/24 1518 Oral     SpO2 08/30/24 1518 97 %     Weight 08/30/24 1517 143 lb 4.8 oz (65 kg)     Height 08/30/24 1517 5' 1 (1.549 m)     Head Circumference --      Peak Flow --      Pain Score 08/30/24 1517 8     Pain Loc --      Pain Education --      Exclude from Growth Chart --     Most recent vital signs: Vitals:   08/30/24 1518  BP: (!) 142/81  Pulse: (!) 114  Resp: 18  Temp: (!) 97.5 F (36.4 C)  SpO2: 97%    Constitutional: Alert and oriented. Eyes:  Conjunctivae are normal. Head: Atraumatic. Nose: No congestion/rhinnorhea. Mouth/Throat: Mucous membranes are moist.  Cardiovascular: Tachycardic, regular rhythm. Grossly normal heart sounds.  2+ radial pulses bilaterally. Respiratory: Normal respiratory effort.  No retractions. Lungs CTAB. Gastrointestinal: Abdomen firm and distended with diffuse tenderness to palpation. Musculoskeletal: No lower extremity tenderness nor edema.  Neurologic:  Normal speech and language. No gross focal neurologic deficits are appreciated.    ED Results / Procedures / Treatments   Labs (all labs ordered are listed, but only abnormal results are displayed) Labs Reviewed  CBC WITH DIFFERENTIAL/PLATELET - Abnormal; Notable for the following components:      Result Value   WBC 11.4 (*)    RBC 6.03 (*)    Hemoglobin 16.1 (*)    HCT 52.6 (*)    Platelets 538 (*)    Neutro Abs 9.9 (*)    All other components within normal limits  COMPREHENSIVE METABOLIC PANEL WITH GFR - Abnormal; Notable for the following components:   BUN 35 (*)    Creatinine, Ser  1.11 (*)    GFR, Estimated 51 (*)    Anion gap 16 (*)    All other components within normal limits  URINALYSIS, ROUTINE W REFLEX MICROSCOPIC - Abnormal; Notable for the following components:   Color, Urine YELLOW (*)    APPearance HAZY (*)    Protein, ur 30 (*)    Leukocytes,Ua TRACE (*)    Bacteria, UA MANY (*)    All other components within normal limits  LIPASE, BLOOD  PROTIME-INR  TYPE AND SCREEN     EKG  ED ECG REPORT I, Carlin Palin, the attending physician, personally viewed and interpreted this ECG.   Date: 08/30/2024  EKG Time: 15:21  Rate: 115  Rhythm: sinus tachycardia  Axis: Normal  Intervals:none  ST&T Change: None  RADIOLOGY CT abdomen/pelvis reviewed and interpreted by me with inflammatory changes in the right lower quadrant as well as multiple dilated bowel loops.  PROCEDURES:  Critical Care performed:  No  Procedures   MEDICATIONS ORDERED IN ED: Medications  piperacillin-tazobactam (ZOSYN) IVPB 3.375 g (has no administration in time range)  morphine (PF) 4 MG/ML injection 4 mg (4 mg Intravenous Given 08/30/24 1554)  ondansetron (ZOFRAN) injection 4 mg (4 mg Intravenous Given 08/30/24 1554)  lactated ringers bolus 1,000 mL (0 mLs Intravenous Stopped 08/30/24 1804)  iohexol  (OMNIPAQUE ) 300 MG/ML solution 100 mL (80 mLs Intravenous Contrast Given 08/30/24 1654)     IMPRESSION / MDM / ASSESSMENT AND PLAN / ED COURSE  I reviewed the triage vital signs and the nursing notes.                              78 y.o. female with past medical history of hypertension, hyperlipidemia, diabetes, and GERD who presents to the ED complaining of increasing abdominal pain with distention over the past 2 weeks, vomited today with some streaks of blood in her emesis.  Patient's presentation is most consistent with acute presentation with potential threat to life or bodily function.  Differential diagnosis includes, but is not limited to, bowel obstruction, appendicitis, diverticulitis, colitis, volvulus, UTI, kidney stone, gastritis, GERD, upper GI bleed, anemia, electrolyte abnormality, AKI.  Patient uncomfortable but nontoxic-appearing and in no acute distress, vital signs remarkable for tachycardia but otherwise reassuring.  Her abdomen is somewhat firm and distended with diffuse tenderness to palpation, will further assess with CT imaging.  She does describe prior bowel obstruction with similar symptoms.  Labs with mild leukocytosis and no significant anemia, low suspicion for significant GI bleed at this time given isolated episode of small-volume hematemesis.  She does apparently have an allergy to PPI described as angioedema so we will hold off on IV Protonix.  Plan to treat symptomatically with IV morphine and Zofran, hydrate with IV fluids and reassess following additional labs and imaging.  Labs with  mild AKI but no significant electrolyte abnormality, LFTs are unremarkable.  CT imaging shows significant inflammatory changes in the right lower quadrant consistent with colitis of the cecum.  Patient has proximal dilation of the small bowel as well as significant distention of the stomach.  Findings reviewed with Dr. Jordis of general surgery, who feels inflammation of the appendix is secondary due to colitis rather than primary appendicitis.  He agrees with plan for antibiotics but no surgical intervention needed at this time.  He also agrees with plan for NG tube insertion given significant distention of the stomach.  Case discussed with hospitalist for admission.  FINAL CLINICAL IMPRESSION(S) / ED DIAGNOSES   Final diagnoses:  Colitis  Ileus (HCC)  Hematemesis with nausea     Rx / DC Orders   ED Discharge Orders     None        Note:  This document was prepared using Dragon voice recognition software and may include unintentional dictation errors.   Willo Dunnings, MD 08/30/24 TYRA

## 2024-08-30 NOTE — ED Triage Notes (Signed)
 Pt with husband who reports pt has been complaining of abd pain for a few weeks but began to have blood in vomit today.

## 2024-08-30 NOTE — ED Notes (Signed)
 Fall risk bundle is currently in place.

## 2024-08-30 NOTE — Progress Notes (Addendum)
 Pt is agitated and anxious, pulled her IV out, new PIV in place. Pt is NPO, CBG 61, requested from MD IVFs with dextrose; started. Valium given for NG tube insertion. Dual lumen stomach tube, 18 Fr inserted at 60 cm with no complications, draining brown content. I will place x-ray to verify NG placement per order.   Unable to complete admission profile due to Pt's agitation and confusion.

## 2024-08-30 NOTE — H&P (Signed)
 History and Physical    Patient: Anita Mercer FMW:[id number] DOB: Apr 22, 1946 DOA: 08/30/2024 DOS: the patient was seen and examined on 08/30/2024 PCP: Shara [name], MD  Patient coming from: Home  Chief Complaint:  Chief Complaint  Patient presents with   Emesis   Abdominal Pain   HPI: Anita Mercer is a 78 y.o. female with medical history significant of neuromuscular disorder, diabetes, GERD, hyperlipidemia, essential hypertension, tenosynovitis, who was brought to the ER by her husband with a complaint of abdominal pain, anorexia and distention.  Husband said patient was afebrile she is here but over the last couple of weeks she has gradually decreased oral intake.  He has noticed that she does not eat anything now on till he tries to force it in her.  She also has had multiple episode of emesis the last 1 with blood which is why he brought her to the ER.  She has not had good bowel movements in the last couple of days.  Is just some watery small amount of stool that she had yesterday.  Patient has been complaining of nausea all these days but.  Since arrival in the ER no vomiting but nausea.  Denied any bright red blood per rectum.  Denied any melena.  Patient is not on any NSAID or blood thinners.  In the emergency room patient was noted to have significant abdominal distention.  Also noted to have confusions.  She cries with little prodding.  Workup in the ER showed findings of acute colitis and possible peritoneal carcinomatosis with small bowel obstruction and gastric distention.  Surgery consulted with recommendation for NG tube insertion, IV antibiotics and surgery will follow.  No prior history of adnexal malignancy or any intra-abdominal malignancy.  Review of Systems: As mentioned in the history of present illness. All other systems reviewed and are negative. 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)    Sinusitis    Tenosynovitis    Past Surgical History:  Procedure Laterality Date   ABDOMINAL HYSTERECTOMY     ACHILLES TENDON SURGERY  2006   removela of bone spur   BUNIONECTOMY     CATARACT EXTRACTION Bilateral 10/18/11  04/04/13   CHOLECYSTECTOMY     COLONOSCOPY  01/13/2011   COLONOSCOPY WITH PROPOFOL  N/A 08/11/2015   Procedure: COLONOSCOPY WITH PROPOFOL ;  Surgeon: Gladis RAYMOND Mariner, MD;  Location: [facility] ENDOSCOPY;  Service: Endoscopy;  Laterality: N/A;   echo  10/23/2012   ESOPHAGOGASTRODUODENOSCOPY (EGD) WITH PROPOFOL  N/A 08/11/2015   Procedure: ESOPHAGOGASTRODUODENOSCOPY (EGD) WITH PROPOFOL ;  Surgeon: Gladis RAYMOND Mariner, MD;  Location: [facility] ENDOSCOPY;  Service: Endoscopy;  Laterality: N/A;   FOOT SURGERY     Dr. Magdalen, bone spurs   GALLBLADDER SURGERY     skin tags     UPPER GASTROINTESTINAL ENDOSCOPY  08/08/2015   with MAC- barretts and duodenitis   Social History:  reports that she has never smoked. She has never used smokeless tobacco. She reports that she does not drink alcohol and does not use drugs.  Allergies  Allergen Reactions   Gabapentin  Other (See Comments)    Experienced severe pain in limbs and tingling in hands  Other Reaction(s): Not available  Severe pain  in limbs and tingling in hands  Severe pain in limbs and tingling in hands    Experienced severe pain in limbs and tingling in hands   Ciprofloxacin      Worsening symptoms of neuropathy   Conjugated Estrogens      Severe itching   Omeprazole Itching, Swelling and Other (See Comments)   Proton Pump Inhibitors     Other reaction(s): Angioedema--prilosec     Family History  Adopted: Yes    Prior to Admission medications   Medication Sig Start Date End Date Taking? Authorizing Provider  aspirin EC 81 MG tablet Take 81 mg by mouth  daily.     Yes [provider]  Cholecalciferol (VITAMIN D) 2000 units CAPS Take 2,000 Units by mouth daily.   Yes [provider]  cyanocobalamin  (VITAMIN B12) 1000 MCG/ML injection Inject 1,000 mcg into the skin every 14 (fourteen) days.   Yes [provider]  dicyclomine (BENTYL) 10 MG capsule Take 10 mg by mouth 3 (three) times daily as needed for spasms. 07/31/24 07/31/25 Yes [provider]  DULoxetine (CYMBALTA) 20 MG capsule Take 20 mg by mouth daily.   Yes [provider]  estradiol  (ESTRACE ) 0.1 MG/GM vaginal cream Place 1 g vaginally 3 (three) times a week. 01/25/18  Yes Penne Knee, MD  famotidine (PEPCID) 40 MG tablet Take 40 mg by mouth daily. 12/16/23  Yes [provider]  Insulin  Glargine (LANTUS  SOLOSTAR) 100 UNIT/ML Solostar Pen Inject 75 Units into the skin every morning. Patient taking differently: Inject 30 Units into the skin 2 (two) times daily. 07/11/17  Yes Justus Leita DEL, MD  lisinopril  (PRINIVIL ,ZESTRIL ) 10 MG tablet TAKE ONE TABLET EVERY DAY 08/19/17  Yes Berglund, Laura H, MD  loratadine (CLARITIN) 10 MG tablet Take 10 mg by mouth daily.   Yes [provider]  metFORMIN  (GLUCOPHAGE ) 1000 MG tablet TAKE ONE TABLET TWICE DAILY 07/22/17  Yes Berglund, Laura H, MD  simvastatin  (ZOCOR ) 20 MG tablet TAKE ONE TABLET AT BEDTIME 10/21/17  Yes Berglund, Laura H, MD  traMADol  (ULTRAM ) 50 MG tablet Take 1-2 tablets (50-100 mg total) by mouth 3 (three) times daily as needed. 05/02/17  Yes Justus Leita DEL, MD  traZODone (DESYREL) 50 MG tablet Take 50 mg by mouth at bedtime. 05/18/24 05/18/25 Yes [provider]  vitamin B-12 (CYANOCOBALAMIN ) 1000 MCG tablet Take by mouth.   Yes [provider]  Blood Glucose Calibration (OT ULTRA/FASTTK CNTRL SOLN) SOLN 1 drop by In Vitro route once. Please dispense controls for patient to use at home with One Touch Ultra machine. Dx code 250.0 03/15/13   Vannie Delon LABOR, MD   glucose blood test strip Please dispense One Touch Ultra testing strips for patient to use with home device. Checking blood sugars 2-3 times a day. Dx code 250.0 12/10/15   Vannie Delon LABOR, MD  Insulin  Pen Needle (PEN NEEDLES) 32G X 4 MM MISC 1 each by Does not apply route daily. 07/11/17   Justus Leita DEL, MD  Lancets [facility] ULTRASOFT) lancets USE TO CHECK BLOOD SUGAR 2-3 TIMES PER DAY 06/06/15   Vannie Delon LABOR, MD    Physical Exam: Vitals:   08/30/24 1517 08/30/24 1518 08/30/24 1940 08/30/24 2039  BP:  (!) 142/81 127/66 125/80  Pulse:  (!) 114 (!) 108 (!) 109  Resp:  18 10 16   Temp:  (!) 97.5 F (36.4 C) 98.3 F (36.8 C) 98.2 F (36.8 C)  TempSrc:  Oral Oral Oral  SpO2:  97% 93% 93%  Weight: 65 kg     Height: 5' 1 (1.549 m)      Constitutional: Confused, acutely ill looking NAD, calm, comfortable Eyes: PERRL, lids and conjunctivae normal ENMT: Mucous membranes are dry posterior pharynx clear of any exudate or lesions.Normal dentition.  Neck: normal, supple, no masses, no thyromegaly Respiratory: clear to auscultation bilaterally, no wheezing, no crackles. Normal respiratory effort. No accessory muscle use.  Cardiovascular: Sinus tachycardia, no murmurs / rubs / gallops. No extremity edema. 2+ pedal pulses. No carotid bruits.  Abdomen: Distended, firm, diffusely tender, decreased bowel sounds Musculoskeletal: Good range of motion, no joint swelling or tenderness, Skin: no rashes, lesions, ulcers. No induration Neurologic: CN 2-12 grossly intact. Sensation intact, DTR normal. Strength 5/5 in all 4.  Psychiatric: Awake and alert but confused, slightly agitated  Data Reviewed:  Temperature 97.5, blood pressure 142/81, pulse 114 respirate of 18.  White count 11.4 hemoglobin 16.1 platelets 538.  BUN is 35 creatinine 1.11 glucose 61.  Urinalysis is not impressive she has some pyuria.  CT abdomen pelvis shows severe gastric distention with moderate wall thickening of gastric  antrum suggestive of peptic ulcer disease.  Severe inflammatory changes involving the cecum suggesting infectious or inflammatory colitis.  Appendix appears to be thickened and inflamed.  Mild ascites and pelvis surrounding the liver and spleen.  Nodular densities anteriorly in the pelvis and involving the omentum concerning for peritoneal carcinomatosis.  A 70 mm lymph node seen anteriorly.  Mildly dilated small bowel loops noted which may represent ileus secondary to inflammation.  Stable nodule seen in lingula segment of left upper lobe mild left lingula segment atelectasis or infiltrate also noted.  Assessment and Plan:  #1 acute colitis: Suspected infectious colitis.  It could be inflammatory.  Could be tomorrow.  At this point patient will be admitted and initiated on IV antibiotics.  Blood cultures obtained.  Surgery consulted to follow.  May require GI consultation as well.  #2 markedly distended stomach: Patient has had hematemesis on prior arrival.  Reported by her husband.  We will at this point start IV Protonix.  NG tube will be inserted.  Decompression will be the goal and then may require both surgical and GI intervention.  #3 possible Peritoneal carcinomatosis: Again we will get surgical consultation.  She has some ascites but not enough for paracentesis.  #4 essential hypertension: Patient will be NPO.  Focus will be on IV medications for blood pressure control.  #5 type 2 diabetes: Patient is NPO.  Will continue with sliding scale insulin .  #6 acute metabolic encephalopathy: Probably due to acute illness.  Patient is delirious.  No prior history of dementia.  Supportive care mainly.  #7 benign essential tremor: Will resume home regimen once oral intake returns.  #8 hyperlipidemia: Will resume home regimen when oral intake returns.    Advance Care Planning:   Code Status: Full Code   Consults: Dr. Jordis, general surgery  Family Communication: Husband at bedside  Severity  of Illness: The appropriate patient status for this patient is INPATIENT. Inpatient status is judged to be reasonable and necessary in order to provide the required intensity of service to ensure the patient's safety. The patient's presenting symptoms, physical exam findings, and initial radiographic and laboratory data in the context of their chronic comorbidities is felt to place them at high risk for further clinical deterioration. Furthermore, it is not anticipated that the patient will be medically stable for discharge from the hospital within 2  midnights of admission.   * I certify that at the point of admission it is my clinical judgment that the patient will require inpatient hospital care spanning beyond 2 midnights from the point of admission due to high intensity of service, high risk for further deterioration and high frequency of surveillance required.*  AuthorBETHA SIM KNOLL, MD 08/30/2024 9:35 PM  For on call review [email or web address].

## 2024-08-30 NOTE — ED Notes (Signed)
 Attempt to place an NG tube for Anita Mercer was not successful, she will not tolerate the procedure. Her husband suggested something for anxiety, but with her baseline mentation he is not sure she will even leave the tube in.  Provider aware.

## 2024-08-31 ENCOUNTER — Inpatient Hospital Stay

## 2024-08-31 ENCOUNTER — Other Ambulatory Visit

## 2024-08-31 DIAGNOSIS — K529 Noninfective gastroenteritis and colitis, unspecified: Secondary | ICD-10-CM | POA: Diagnosis not present

## 2024-08-31 LAB — COMPREHENSIVE METABOLIC PANEL WITH GFR
ALT: 20 U/L (ref 0–44)
AST: 41 U/L (ref 15–41)
Albumin: 3 g/dL — ABNORMAL LOW (ref 3.5–5.0)
Alkaline Phosphatase: 100 U/L (ref 38–126)
Anion gap: 9 (ref 5–15)
BUN: 35 mg/dL — ABNORMAL HIGH (ref 8–23)
CO2: 24 mmol/L (ref 22–32)
Calcium: 8.6 mg/dL — ABNORMAL LOW (ref 8.9–10.3)
Chloride: 107 mmol/L (ref 98–111)
Creatinine, Ser: 1.04 mg/dL — ABNORMAL HIGH (ref 0.44–1.00)
GFR, Estimated: 55 mL/min — ABNORMAL LOW (ref >60)
Glucose, Bld: 67 mg/dL — ABNORMAL LOW (ref 70–99)
Potassium: 3.4 mmol/L — ABNORMAL LOW (ref 3.5–5.1)
Sodium: 140 mmol/L (ref 135–145)
Total Bilirubin: 0.4 mg/dL (ref 0.0–1.2)
Total Protein: 6.2 g/dL — ABNORMAL LOW (ref 6.5–8.1)

## 2024-08-31 LAB — CBC
HCT: 41.7 % (ref 36.0–46.0)
Hemoglobin: 13.4 g/dL (ref 12.0–15.0)
MCH: 27.5 pg (ref 26.0–34.0)
MCHC: 32.1 g/dL (ref 30.0–36.0)
MCV: 85.5 fL (ref 80.0–100.0)
Platelets: 427 K/uL — ABNORMAL HIGH (ref 150–400)
RBC: 4.88 MIL/uL (ref 3.87–5.11)
RDW: 13.9 % (ref 11.5–15.5)
WBC: 7.7 K/uL (ref 4.0–10.5)
nRBC: 0 % (ref 0.0–0.2)

## 2024-08-31 LAB — GLUCOSE, CAPILLARY
Glucose-Capillary: 121 mg/dL — ABNORMAL HIGH (ref 70–99)
Glucose-Capillary: 134 mg/dL — ABNORMAL HIGH (ref 70–99)
Glucose-Capillary: 143 mg/dL — ABNORMAL HIGH (ref 70–99)
Glucose-Capillary: 60 mg/dL — ABNORMAL LOW (ref 70–99)
Glucose-Capillary: 65 mg/dL — ABNORMAL LOW (ref 70–99)
Glucose-Capillary: 68 mg/dL — ABNORMAL LOW (ref 70–99)
Glucose-Capillary: 70 mg/dL (ref 70–99)
Glucose-Capillary: 88 mg/dL (ref 70–99)

## 2024-08-31 LAB — TYPE AND SCREEN: ABO/RH(D): O POS

## 2024-08-31 LAB — LIPASE, BLOOD: Lipase: 38 U/L (ref 11–51)

## 2024-08-31 MED ORDER — DEXTROSE 50 % IV SOLN: 12.5000 g | Freq: Once | INTRAVENOUS | Status: AC

## 2024-08-31 MED ORDER — DEXTROSE 50 % IV SOLN
INTRAVENOUS | Status: AC
  Administered 2024-08-31: 12.5 g via INTRAVENOUS
  Filled 2024-08-31: qty 50

## 2024-08-31 MED ORDER — DEXTROSE IN LACTATED RINGERS 5 % IV SOLN: INTRAVENOUS | Status: DC

## 2024-08-31 MED ORDER — IOHEXOL 9 MG/ML PO SOLN: 500.0000 mL | ORAL | Status: DC

## 2024-08-31 MED ORDER — IOHEXOL 9 MG/ML PO SOLN
500.0000 mL | ORAL | Status: AC
  Administered 2024-08-31: 500 mL

## 2024-08-31 MED ORDER — DEXTROSE 50 % IV SOLN
12.5000 g | INTRAVENOUS | Status: AC
  Administered 2024-08-31: 12.5 g via INTRAVENOUS
  Filled 2024-08-31: qty 50

## 2024-08-31 MED ORDER — DEXTROSE-SODIUM CHLORIDE 10-0.45 % IV SOLN
INTRAVENOUS | Status: DC
  Filled 2024-08-31 (×5): qty 1000

## 2024-08-31 MED ORDER — CHLORHEXIDINE GLUCONATE CLOTH 2 % EX PADS
6.0000 | MEDICATED_PAD | Freq: Every day | CUTANEOUS | Status: DC
Start: 2024-08-31 — End: 2024-09-09
  Administered 2024-08-31 – 2024-09-07 (×7): 6 via TOPICAL

## 2024-08-31 MED ORDER — POTASSIUM CHLORIDE 10 MEQ/100ML IV SOLN
10.0000 meq | INTRAVENOUS | Status: AC
  Administered 2024-08-31 (×2): 10 meq via INTRAVENOUS
  Filled 2024-08-31 (×2): qty 100

## 2024-08-31 NOTE — Progress Notes (Signed)
 Pt pulled NG tube out. New tube was placed with the assistance  of Aida RN. Pt was given Valium to provide comfort during procedure. 18Fr inserted at 60 cm with no complications, aspirated black content, order place for x-ray to verify NG placement. Provider notified.

## 2024-08-31 NOTE — Progress Notes (Signed)
  PROGRESS NOTE    Anita Mercer  FMW:[id number] DOB: [birth year] DOA: 08/30/2024 PCP: Shara [name], MD  [id number]  LOS: 1 day   Brief hospital course:   Assessment & Plan: Anita Mercer is a 78 y.o. female with medical history significant of neuromuscular disorder, diabetes, GERD, hyperlipidemia, essential hypertension, tenosynovitis, who was brought to the ER by her husband with a complaint of abdominal pain, anorexia and distention.    #1 acute colitis:  --etiology unclear, possibly infectious.  started on zosyn and switched to ceftriaxone and flagyl --cont ceftriaxone and flagyl   #2 markedly distended stomach: Patient has had hematemesis on prior arrival.  Reported by her husband.   --GenSurg consulted.  NGT placed. --CT a/p with oral contrast to rule out perforated ulcer.  #3 possible Peritoneal carcinomatosis:   Hypoglycemia 2/2 NPO --cont MIVF as D10 infusion --BG q4h to monitor for hypoglycemia   #5 type 2 diabetes:  --A1c 6.7.  Will not order insulin .   #6 acute metabolic encephalopathy: Probably due to acute illness.  Patient is delirious.  No prior history of dementia.  Supportive care mainly.   #7 benign essential tremor: Will resume home regimen once oral intake returns.   #8 hyperlipidemia: Will resume home regimen when oral intake returns.  Hypokalemia --supplement with IV K   DVT prophylaxis: SCD/Compression stockings Code Status: Full code  Family Communication:  Level of care: Telemetry Medical Dispo:   The patient is from: home Anticipated d/c is to: to be determined Anticipated d/c date is: to be determined    Subjective and Interval History:  Pt remained confused.   Objective: Vitals:   08/31/24 0425 08/31/24 0434 08/31/24 0753 08/31/24 1605  BP: 131/75  (!) 119/93 132/85  Pulse: (!) 116  (!) 105 (!) 108  Resp: 16  17 20   Temp: 97.9 F (36.6 C)  98.4 F (36.9 C) 97.6 F (36.4 C)  TempSrc: Oral  Oral   SpO2: 94% 94% 95%  90%  Weight:      Height:        Intake/Output Summary (Last 24 hours) at 08/31/2024 1828 Last data filed at 08/31/2024 1739 Gross per 24 hour  Intake 442.87 ml  Output 1120 ml  Net -677.13 ml   Filed Weights   08/30/24 1517  Weight: 65 kg    Examination:   Constitutional: NAD, sleeping but easily arousable HEENT: conjunctivae and lids normal, EOMI CV: No cyanosis.   RESP: normal respiratory effort, on RA Abdomen: NG tube present   Data Reviewed: I have personally reviewed labs and imaging studies  Time spent: 50 minutes  Ellouise Haber, MD [facility] If 7PM-7AM, please contact night-coverage 08/31/2024, 6:28 PM

## 2024-08-31 NOTE — Progress Notes (Signed)
 Pts CBG for supper was 65. Pt has no s/s of hypoglycemia. She is currently npo. Messaged Dr. Awanda to inform her about result. Awaiting providers response.

## 2024-08-31 NOTE — Progress Notes (Signed)
 9449 noted that pt had only voided one time on shift, minimum amount of urine. Pt has IVFs infusing at 125 ml/hr. I bladder scan the pt and I did not think it was accurate since pt is restless and uncooperative. Bladder scan kept reading from 0-50 ml and 200-300 ml; not accurate. An I&O straight cath was done, total output 620 ml.

## 2024-08-31 NOTE — Progress Notes (Signed)
 Hypoglycemic Event  CBG: 68 @ 0624 D5 in LR infusing @125  ml/hr  Treatment: D50 25 mL (12.5 gm)  Symptoms: None  Follow-up CBG: Time:0718 CBG Result:134  Possible Reasons for Event: NPO, MD Cleatus and MD Sim made aware, suggested an increase in Dextrose to IVFs.   Comments/MD notified: Cleatus Sim. Per MD Cleatus, she is already on D5LR. .will continue current plan but you can pass to day team to try something different  MD Awanda and Marcey, day shift nurse have been added to conversation via private messaging.   Thedore Pickel J Morelia Cassells

## 2024-08-31 NOTE — Consult Note (Signed)
 [facility] SURGICAL CONSULTATION NOTE (initial) - cpt: 00756   HISTORY OF PRESENT ILLNESS (HPI):  78 y.o. female presented to [facility] ED yesterday for evaluation of abdominal pain. Patient unable to provide history this morning. History corroborated with husband. Patient reports around a 2 week history of progressive abdominal pain. This seems to be worse across her lower abdomen. Pain has been accompanies by distension, constipation, nausea, and decreased appetite. Did have an episode of emesis prior to arrival. She denied any fever, chills, CP, SOB, or urinary changes. He does report she is more constipated but intermittently has diarrhea. Previous surgeries positive for abdominal hysterectomy, cholecystectomy. Husband reports she has had colonoscopy and EGD in the past 3 years and noted irritation of the stomach and esophagus. Work up in the ED revealed a leukocytosis with WBC to 11.4K, Hgb to 16.1 (now 13.4), AKI with sCr - 1.11 (now 1.04). CT Abdomen/Pelvis was obtained and concerning possible colitis involving the cecum but stomach also noted to be massively dilated. NGT was placed, pulled by the patient, and then replaced this morning. Output measured at 150 ccs.   Surgery is consulted by emergency medicine physician Dr. Carlin Palin, MD in this context for evaluation and management of possible colitis and gastric distension.  PAST MEDICAL HISTORY (PMH):  Past Medical History:  Diagnosis Date   Actinic keratosis    Adopted    Cataract    Diabetes mellitus 2000   GERD (gastroesophageal reflux disease)    Hyperlipidemia    Hypertension    Neuromuscular disorder (HCC)    peripheral neuropathy   RMSF [facility] spotted fever)    Sinusitis    Tenosynovitis      PAST SURGICAL HISTORY (PSH):  Past Surgical History:  Procedure Laterality Date   ABDOMINAL HYSTERECTOMY     ACHILLES TENDON SURGERY  2006   removela of bone spur   BUNIONECTOMY     CATARACT  EXTRACTION Bilateral 10/18/11  04/04/13   CHOLECYSTECTOMY     COLONOSCOPY  01/13/2011   COLONOSCOPY WITH PROPOFOL  N/A 08/11/2015   Procedure: COLONOSCOPY WITH PROPOFOL ;  Surgeon: Gladis RAYMOND Mariner, MD;  Location: [facility] ENDOSCOPY;  Service: Endoscopy;  Laterality: N/A;   echo  10/23/2012   ESOPHAGOGASTRODUODENOSCOPY (EGD) WITH PROPOFOL  N/A 08/11/2015   Procedure: ESOPHAGOGASTRODUODENOSCOPY (EGD) WITH PROPOFOL ;  Surgeon: Gladis RAYMOND Mariner, MD;  Location: [facility] ENDOSCOPY;  Service: Endoscopy;  Laterality: N/A;   FOOT SURGERY     Dr. Magdalen, bone spurs   GALLBLADDER SURGERY     skin tags     UPPER GASTROINTESTINAL ENDOSCOPY  08/08/2015   with MAC- barretts and duodenitis     MEDICATIONS:  Prior to Admission medications   Medication Sig Start Date End Date Taking? Authorizing Provider  aspirin EC 81 MG tablet Take 81 mg by mouth daily.     Yes [provider]  Cholecalciferol (VITAMIN D) 2000 units CAPS Take 2,000 Units by mouth daily.   Yes [provider]  cyanocobalamin  (VITAMIN B12) 1000 MCG/ML injection Inject 1,000 mcg into the skin every 14 (fourteen) days.   Yes [provider]  dicyclomine (BENTYL) 10 MG capsule Take 10 mg by mouth 3 (three) times daily as needed for spasms. 07/31/24 07/31/25 Yes [provider]  DULoxetine (CYMBALTA) 20 MG capsule Take 20 mg by mouth daily.   Yes [provider]  estradiol  (ESTRACE ) 0.1 MG/GM vaginal cream Place 1 g vaginally 3 (three) times a week. 01/25/18  Yes Penne Knee, MD  famotidine (  PEPCID) 40 MG tablet Take 40 mg by mouth daily. 12/16/23  Yes [provider]  Insulin  Glargine (LANTUS  SOLOSTAR) 100 UNIT/ML Solostar Pen Inject 75 Units into the skin every morning. Patient taking differently: Inject 30 Units into the skin 2 (two) times daily. 07/11/17  Yes Justus Leita DEL, MD  lisinopril  (PRINIVIL ,ZESTRIL ) 10 MG tablet TAKE ONE TABLET EVERY DAY 08/19/17  Yes Berglund, Laura H, MD  loratadine  (CLARITIN) 10 MG tablet Take 10 mg by mouth daily.   Yes [provider]  metFORMIN  (GLUCOPHAGE ) 1000 MG tablet TAKE ONE TABLET TWICE DAILY 07/22/17  Yes Berglund, Laura H, MD  simvastatin  (ZOCOR ) 20 MG tablet TAKE ONE TABLET AT BEDTIME 10/21/17  Yes Justus Leita DEL, MD  traMADol  (ULTRAM ) 50 MG tablet Take 1-2 tablets (50-100 mg total) by mouth 3 (three) times daily as needed. 05/02/17  Yes Justus Leita DEL, MD  traZODone (DESYREL) 50 MG tablet Take 50 mg by mouth at bedtime. 05/18/24 05/18/25 Yes [provider]  vitamin B-12 (CYANOCOBALAMIN ) 1000 MCG tablet Take by mouth.   Yes [provider]  Blood Glucose Calibration (OT ULTRA/FASTTK CNTRL SOLN) SOLN 1 drop by In Vitro route once. Please dispense controls for patient to use at home with One Touch Ultra machine. Dx code 250.0 03/15/13   Vannie Delon LABOR, MD  glucose blood test strip Please dispense One Touch Ultra testing strips for patient to use with home device. Checking blood sugars 2-3 times a day. Dx code 250.0 12/10/15   Vannie Delon LABOR, MD  Insulin  Pen Needle (PEN NEEDLES) 32G X 4 MM MISC 1 each by Does not apply route daily. 07/11/17   Justus Leita DEL, MD  Lancets (ONETOUCH ULTRASOFT) lancets USE TO CHECK BLOOD SUGAR 2-3 TIMES PER DAY 06/06/15   Vannie Delon LABOR, MD     ALLERGIES:  Allergies  Allergen Reactions   Gabapentin  Other (See Comments)    Experienced severe pain in limbs and tingling in hands  Other Reaction(s): Not available  Severe pain in limbs and tingling in hands  Severe pain in limbs and tingling in hands    Experienced severe pain in limbs and tingling in hands   Ciprofloxacin      Worsening symptoms of neuropathy   Conjugated Estrogens      Severe itching   Omeprazole Itching, Swelling and Other (See Comments)   Proton Pump Inhibitors     Other reaction(s): Angioedema--prilosec      SOCIAL HISTORY:  Social History   Socioeconomic History   Marital status: Married     Spouse name: Not on file   Number of children: 1   Years of education: Not on file   Highest education level: Not on file  Occupational History   Occupation: Retired - Engineer, manufacturing    Comment: 31 yrs [employer]  Tobacco Use   Smoking status: Never   Smokeless tobacco: Never  Substance and Sexual Activity   Alcohol use: No   Drug use: No   Sexual activity: Never  Other Topics Concern   Not on file  Social History Narrative   Lives in [place] with husband. Moved from [place]. Orig from [place]. Daughter lives in [place] . Dogs at home. Engineer, manufacturing at [employer]. Hobbies - stained glass, woodcarving.      Diet: regular, limited carbs. Daily Caffeine Use:  Coffee 2-3   Regular Exercise -  Daily, yardwork   Social Drivers of Health   Financial Resource Strain: Low Risk  (03/23/2024)  Received from [facility]   Overall Financial Resource Strain (CARDIA)    Difficulty of Paying Living Expenses: Not hard at all  Food Insecurity: No Food Insecurity (03/23/2024)   Received from [facility]   Hunger Vital Sign    Within the past 12 months, you worried that your food would run out before you got the money to buy more.: Never true    Within the past 12 months, the food you bought just didn't last and you didn't have money to get more.: Never true  Transportation Needs: No Transportation Needs (03/23/2024)   Received from [facility] - Transportation    In the past 12 months, has lack of transportation kept you from medical appointments or from getting medications?: No    Lack of Transportation (Non-Medical): No  Physical Activity: Inactive (03/13/2024)   Received from [facility]   Exercise Vital Sign    On average, how many days per week do you engage in moderate to strenuous exercise (like a brisk walk)?: 0 days    On average, how many minutes do you engage in exercise at this level?: 0 min  Stress: Stress  Concern Present (03/13/2024)   Received from [facility] - Occupational Stress Questionnaire    Feeling of Stress : Very much  Social Connections: Moderately Integrated (03/12/2024)   Received from [facility]   Social Connection and Isolation Panel    In a typical week, how many times do you talk on the phone with family, friends, or neighbors?: More than three times a week    How often do you get together with friends or relatives?: More than three times a week    How often do you attend church or religious services?: Never    Do you belong to any clubs or organizations such as church groups, unions, fraternal or athletic groups, or school groups?: Yes    How often do you attend meetings of the clubs or organizations you belong to?: More than 4 times per year    Are you married, widowed, divorced, separated, never married, or living with a partner?: Married  Intimate Partner Violence: Not At Risk (03/13/2024)   Received from [facility]   Humiliation, Afraid, Rape, and Kick questionnaire    Within the last year, have you been afraid of your partner or ex-partner?: No    Within the last year, have you been humiliated or emotionally abused in other ways by your partner or ex-partner?: No    Within the last year, have you been kicked, hit, slapped, or otherwise physically hurt by your partner or ex-partner?: No    Within the last year, have you been raped or forced to have any kind of sexual activity by your partner or ex-partner?: No     FAMILY HISTORY:  Family History  Adopted: Yes      REVIEW OF SYSTEMS:  Review of Systems  Unable to perform ROS: Mental status change  Gastrointestinal:  Positive for abdominal pain, nausea and vomiting.    VITAL SIGNS:  Temp:  [97.5 F (36.4 C)-98.4 F (36.9 C)] 98.4 F (36.9 C) (10/10 0753) Pulse Rate:  [105-116] 105 (10/10 0753) Resp:  [10-18] 17 (10/10 0753) BP: (119-142)/(66-93) 119/93  (10/10 0753) SpO2:  [93 %-97 %] 95 % (10/10 0753) Weight:  [65 kg] 65 kg (10/09 1517)     Height: 5' 1 (  154.9 cm) Weight: 65 kg BMI (Calculated): 27.09   INTAKE/OUTPUT:  10/09 0701 - 10/10 0700 In: -  Out: 770 [Urine:620; Emesis/NG output:150]  PHYSICAL EXAM:  Physical Exam Vitals and nursing note reviewed. Exam conducted with a chaperone present.  Constitutional:      General: She is not in acute distress.    Appearance: She is normal weight.     Comments: Patient sleeping in bed, snoring, NAD. Husband and sitter at bedside   HENT:     Head: Normocephalic and atraumatic.     Comments: NGT in place; currently clamped Eyes:     General: No scleral icterus.    Extraocular Movements: Extraocular movements intact.  Cardiovascular:     Rate and Rhythm: Tachycardia present.     Heart sounds: Normal heart sounds. No murmur heard. Pulmonary:     Effort: Pulmonary effort is normal. No respiratory distress.  Abdominal:     General: There is no distension.     Palpations: Abdomen is soft.     Tenderness: There is no abdominal tenderness.     Comments: Abdomen is soft, exam is not reliable but she does not appear overtly tender.   Genitourinary:    Comments: Deferred Skin:    General: Skin is warm and dry.      Labs:     Latest Ref Rng & Units 08/31/2024    4:49 AM 08/30/2024    3:27 PM 05/28/2016   12:11 PM  CBC  WBC 4.0 - 10.5 K/uL 7.7  11.4  8.6   Hemoglobin 12.0 - 15.0 g/dL 86.5  83.8  84.9   Hematocrit 36.0 - 46.0 % 41.7  52.6  45.5   Platelets 150 - 400 K/uL 427  538  277.0       Latest Ref Rng & Units 08/31/2024    4:49 AM 08/30/2024    3:27 PM 08/29/2023   11:28 AM  CMP  Glucose 70 - 99 mg/dL 67  94    BUN 8 - 23 mg/dL 35  35    Creatinine 9.55 - 1.00 mg/dL 8.95  8.88  9.19   Sodium 135 - 145 mmol/L 140  140    Potassium 3.5 - 5.1 mmol/L 3.4  4.4    Chloride 98 - 111 mmol/L 107  98    CO2 22 - 32 mmol/L 24  26    Calcium 8.9 - 10.3 mg/dL 8.6  9.3    Total  Protein 6.5 - 8.1 g/dL 6.2  7.8    Total Bilirubin 0.0 - 1.2 mg/dL 0.4  1.1    Alkaline Phos 38 - 126 U/L 100  67    AST 15 - 41 U/L 41  41    ALT 0 - 44 U/L 20  14       Imaging studies:   CT Abdomen/Pelvis (08/30/2024) personally reviewed with noted colitis involving the cecum, scattered free fluid, and massive dilation of the stomach, and radiologist report reviewed below:  IMPRESSION: 1. Severe gastric distention is noted with moderate wall thickening of gastric antrum suggesting peptic ulcer disease. 2. Severe inflammatory changes are seen involving the cecum suggesting infectious or inflammatory colitis. The appendix appears to be thickened and inflamed suggesting either appendicitis or inflammation secondary to cecal inflammation. 3. Mild ascites is noted in the abdomen and pelvis and around the liver and spleen. 4. Nodular densities are noted anteriorly in the pelvis and involving the omentum concerning for peritoneal carcinomatosis. This includes 17 mm lymph  node anteriorly. 5. Mildly dilated small bowel loops are noted which may represent ileus secondary to inflammation. 6. Stable nodule seen in lingular segment left upper lobe. Mild left lingular subsegmental atelectasis or infiltrate is noted. 7. Aortic atherosclerosis.   Assessment/Plan:  78 y.o. female presenting with colitis, etiology unclear (possibly infectious) also found to have massive dilation of the stomach with antral thickening   - Overall, her clinic picture is not clear cut and patient this morning unable to provide any reliable nor additional history. As such, we will plan for contrast evaluation with CT to include PO contrast via NGT to ensure no missed gastric perforation as well as to reassess intra-abdominal process.  - Would continue NGT decompression for now; follow up replacement KUB - Monitor abdominal examination; on-going bowel function   - Continue IV Abx (Rocephin, Flagyl)  - Continue IVF  support  - Monitor renal function; improved  - Further management per primary service; we will follow along   All of the above findings and recommendations were discussed with the patient's husband, and all of patient's family's questions were answered to thier expressed satisfaction.  Thank you for the opportunity to participate in this patient's care.   -- Arthea Platt, PA-C [facility] 08/31/2024, 12:03 PM M-F: 7am - 4pm

## 2024-08-31 NOTE — Progress Notes (Signed)
 Hypoglycemic Event  CBG: 61 @2051  Pt is a new admit from ED. MD Sim notified and requested IVFs with Dextrose. PT asymptomatic.  Treatment: D5 in LR started at 125 ml/hr Symptoms: None  Follow-up CBG: Time: 2321  CBG Result: 59 Asymptomatic, on D5 in LR infusing at 125 ml/hr Follow-up CBG: Time:0011  CBG Result:60 Asymptomatic, D5 in LR infusing at 125 ml/hr. dextrose 50 % solution 12.5 g administered @ 0023.  Follow-up CBG: 143 Time:0041   Possible Reasons for Event: Vomiting and Inadequate meal intake, NPO  Comments/MD notified: MD. Sim Standing orders in place per hypoglycemia protocol    Shuronda Santino J Vinessa Macconnell

## 2024-09-01 DIAGNOSIS — C786 Secondary malignant neoplasm of retroperitoneum and peritoneum: Principal | ICD-10-CM

## 2024-09-01 DIAGNOSIS — K529 Noninfective gastroenteritis and colitis, unspecified: Secondary | ICD-10-CM | POA: Diagnosis not present

## 2024-09-01 DIAGNOSIS — F03C0 Unspecified dementia, severe, without behavioral disturbance, psychotic disturbance, mood disturbance, and anxiety: Secondary | ICD-10-CM

## 2024-09-01 DIAGNOSIS — K920 Hematemesis: Secondary | ICD-10-CM | POA: Diagnosis not present

## 2024-09-01 DIAGNOSIS — F05 Delirium due to known physiological condition: Secondary | ICD-10-CM

## 2024-09-01 DIAGNOSIS — Z515 Encounter for palliative care: Secondary | ICD-10-CM | POA: Diagnosis not present

## 2024-09-01 DIAGNOSIS — Z711 Person with feared health complaint in whom no diagnosis is made: Secondary | ICD-10-CM

## 2024-09-01 LAB — GLUCOSE, CAPILLARY
Glucose-Capillary: 115 mg/dL — ABNORMAL HIGH (ref 70–99)
Glucose-Capillary: 124 mg/dL — ABNORMAL HIGH (ref 70–99)
Glucose-Capillary: 148 mg/dL — ABNORMAL HIGH (ref 70–99)
Glucose-Capillary: 187 mg/dL — ABNORMAL HIGH (ref 70–99)
Glucose-Capillary: 193 mg/dL — ABNORMAL HIGH (ref 70–99)

## 2024-09-01 LAB — BASIC METABOLIC PANEL WITH GFR
Anion gap: 9 (ref 5–15)
BUN: 22 mg/dL (ref 8–23)
CO2: 27 mmol/L (ref 22–32)
Calcium: 8 mg/dL — ABNORMAL LOW (ref 8.9–10.3)
Chloride: 107 mmol/L (ref 98–111)
Creatinine, Ser: 0.85 mg/dL (ref 0.44–1.00)
GFR, Estimated: >60 mL/min (ref >60)
Glucose, Bld: 136 mg/dL — ABNORMAL HIGH (ref 70–99)
Potassium: 2.9 mmol/L — ABNORMAL LOW (ref 3.5–5.1)
Sodium: 143 mmol/L (ref 135–145)

## 2024-09-01 LAB — C DIFFICILE QUICK SCREEN W PCR REFLEX
C Diff antigen: NEGATIVE
C Diff interpretation: NOT DETECTED
C Diff toxin: NEGATIVE

## 2024-09-01 LAB — MAGNESIUM: Magnesium: 1.7 mg/dL (ref 1.7–2.4)

## 2024-09-01 MED ORDER — DEXTROSE-SODIUM CHLORIDE 5-0.9 % IV SOLN: INTRAVENOUS | Status: AC

## 2024-09-01 MED ORDER — POTASSIUM CHLORIDE 10 MEQ/100ML IV SOLN
10.0000 meq | INTRAVENOUS | Status: AC
  Administered 2024-09-01 (×3): 10 meq via INTRAVENOUS
  Filled 2024-09-01 (×2): qty 100

## 2024-09-01 NOTE — Consult Note (Signed)
 Consultation Note Date: 09/01/2024   Patient Name: Anita Mercer  DOB: [birth year]  MRN: [id number]  Age / Sex: 78 y.o., female  PCP: Shara [name], MD Referring Physician: Awanda City, MD  Reason for Consultation: Establishing goals of care   HPI/Brief Hospital Course: 78 y.o. female  with past medical history of diabetes, hypertension, hyperlipidemia, peripheral neuropathy, dementia followed by outpatient neurology, chronic abdominal pain followed by GI as outpatient admitted from home on 08/30/2024 with abdominal pain with associated distention, constipation, nausea and decreased appetite, has had 1 episode of emesis reportedly symptoms have been persisting for about 2 to 3 weeks.  Clinical picture remains unclear.  CT scan obtained revealing ascites in the right upper quadrant, distended stomach and inflammation Admitted and being treated for colitis with unclear etiology--massive dilation of the stomach with antral thickening, NG tube placed for decompression, antibiotic therapy initiated along with IV fluid resuscitation Repeat CT scan with contrast reveals concern for continued ascites and peritoneal carcinomatosis and thickening of ascending colon--General Surgery following, recommends goals of care conversations be had with high degree of concern for malignancy  Palliative medicine was consulted for assisting with goals of care conversations.  Subjective:  Extensive chart review has been completed prior to meeting patient including labs, vital signs, imaging, progress notes, orders, and available advanced directive documents from current and previous encounters.  Visited with [name] at her bedside.  Resting in bed with eyes closed, does not acknowledge my presence in room, sitter at bedside.  Per sitter husband recently stepped out to go home with plans to return this afternoon.  Requested sitter send secure chat once husband back at bedside  visiting.  Spoke with nursing staff.  Per nursing [name] disoriented and becomes easily agitated and combative.  Mittens were placed for safety.  Per nursing, impaired cognition at baseline.  Behaviors associated with impaired cognition are new per nursing staff and conversations with husband.  Attempted to call husband without success, call sent to straight to voicemail.  Returned to bedside. [name] aware, sitter at bedside attempting to provide hydration swab to mouth and lips, [name] appears agitated, unable to follow commands to open mouth and allow for swab.  [name] opens eyes on command. She is only able to state her last name, unable to recall her last name or her birth date. Does not respond to further orientation questions. At this time, [name] unable to participate in goals of care conversations.  Husband not at bedside, according to sitter he has not yet returned. Attempted call again to husband, unsuccessfully.  Assessed symptoms, [name] becomes tearful when asking about symptoms but denies acute pain or discomfort. No non verbal signs of pain or discomfort on abdominal palpation.  Will attempt GOC conversation with husband tomorrow. Full consult note to follow.  PMT will continue to follow and support patient as needed.  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    Appearance: She is ill-appearing.  HENT:     Head: Normocephalic.     Mouth/Throat:     Mouth: Mucous membranes are dry.  Pulmonary:  Effort: Pulmonary effort is normal. No respiratory distress.   Abdominal:     General: There is no distension.     Palpations: Abdomen is soft.     Tenderness: There is no abdominal tenderness.  Skin:    General: Skin is warm and dry.  Neurological:     Mental Status: She is easily aroused. She is disoriented.     Motor: Weakness present.     Vital Signs: BP (!) 143/69 (BP Location: Right Arm)   Pulse 92   Temp 98.6 F (37 C)   Resp 18   Ht 5' 1 (1.549 m)   Wt 65 kg   SpO2 91%   BMI 27.08 kg/m  Pain Scale: Faces   Pain Score: Asleep  IO: Intake/output summary:  Intake/Output Summary (Last 24 hours) at 09/01/2024 1557 Last data filed at 09/01/2024 1141 Gross per 24 hour  Intake 65.05 ml  Output 3100 ml  Net -3034.95 ml    LBM: Last BM Date :  (NG tube to low interm. suction) Baseline Weight: Weight: 65 kg Most recent weight: Weight: 65 kg      Assessment and Plan  SUMMARY OF RECOMMENDATIONS   Attempt GOC with husband tomorrow  Palliative Prophylaxis:   Bowel Regimen, Delirium Protocol and Frequent Pain Assessment  Discussed With: Nursing staff   Thank you for this consult and allowing Palliative Medicine to participate in the care of Niels WENDI Heaps. Palliative medicine will continue to follow and assist as needed.   Visit includes: Detailed review of medical records (labs, imaging, vital signs), medically appropriate exam (mental status, respiratory, cardiac, skin), discussed with treatment team, counseling and educating patient, family and staff, documenting clinical information, medication management and coordination of care.   Signed by: Waddell Lesches, DNP, AGNP-C Palliative Medicine    Please contact Palliative Medicine Team phone at [phone] for questions and concerns.  For individual provider: See Tracey

## 2024-09-01 NOTE — Progress Notes (Signed)
 CC: peritoneal carcinomatosis  Subjective: Chart reviewed and patient came in with distended stomach with ascites.  Repeat CT scan yesterday and there was concern for continued ascites with peritoneal carcinomatosis as well as thickening of the ascending colon.  She is encephalopathic.  No family is at bedside today.  She tells me that she loves me  Bedside nursing assistant did report that she had a bowel movement.  Objective: Vital signs in last 24 hours: Temp:  [97.6 F (36.4 C)-98.6 F (37 C)] 98.6 F (37 C) (10/11 1013) Pulse Rate:  [92-112] 92 (10/11 1015) Resp:  [18-20] 18 (10/11 1013) BP: (132-152)/(55-85) 143/69 (10/11 1013) SpO2:  [89 %-92 %] 91 % (10/11 1015) Last BM Date :  (NG tube to low interm. suction)  Intake/Output from previous day: 10/10 0701 - 10/11 0700 In: 507.9 [I.V.:35.2; IV Piggyback:472.7] Out: 2500 [Urine:1150; Emesis/NG output:1350] Intake/Output this shift: Total I/O In: -  Out: 600 [Urine:600]  Physical exam:  Confused but not combative this morning.  Abdomen is nondistended and soft.  NG tube with gastric appearing output.  Lab Results: CBC  Recent Labs    08/30/24 1527 08/31/24 0449  WBC 11.4* 7.7  HGB 16.1* 13.4  HCT 52.6* 41.7  PLT 538* 427*   BMET Recent Labs    08/31/24 0449 09/01/24 0346  NA 140 143  K 3.4* 2.9*  CL 107 107  CO2 24 27  GLUCOSE 67* 136*  BUN 35* 22  CREATININE 1.04* 0.85  CALCIUM 8.6* 8.0*   PT/INR Recent Labs    08/30/24 2229  LABPROT 13.8  INR 1.0   ABG No results for input(s): PHART, HCO3 in the last 72 hours.  Invalid input(s): PCO2, PO2  Studies/Results: CT ABDOMEN PELVIS WO CONTRAST Result Date: 08/31/2024 CLINICAL DATA:  Diffuse abdominal pain EXAM: CT ABDOMEN AND PELVIS WITHOUT CONTRAST TECHNIQUE: Multidetector CT imaging of the abdomen and pelvis was performed following the standard protocol without IV contrast. RADIATION DOSE REDUCTION: This exam was performed according to  the departmental dose-optimization program which includes automated exposure control, adjustment of the mA and/or kV according to patient size and/or use of iterative reconstruction technique. COMPARISON:  CT from the previous day. FINDINGS: Lower chest: Small pleural effusions are noted bilaterally with associated basilar atelectasis. This has increased slightly in the interval from the prior exam. Hepatobiliary: The degree of perihepatic fluid appears to have increased slightly in the interval from the prior exam. No definitive hepatic mass is seen. The gallbladder has been surgically removed. Pancreas: Unremarkable. No pancreatic ductal dilatation or surrounding inflammatory changes. Spleen: Spleen is within normal limits although increased perisplenic fluid is noted when compared with the prior study. Adrenals/Urinary Tract: Adrenal glands are within normal limits. Kidneys demonstrate excretion from prior CT examination. No mass lesion is seen. No obstructive changes are noted. The bladder is decompressed by Foley catheter. Stomach/Bowel: Scattered mild diverticular change of the colon is noted. Fluid is noted throughout the colon it may be related to an underlying diarrheal state. No obstructive changes are seen. The appendix is well visualized and similar to that seen on the prior exam. No perforation is identified. Some inflammatory changes noted in the right lower quadrant similar to that seen on prior exam. This may simply be related to underlying inflammatory change of the cecum. No obstructive changes of the small bowel are noted. Stomach is well distended with a gastric catheter within. Vascular/Lymphatic: Atherosclerotic calcifications are identified. Scattered lymph nodes are noted surrounding the celiac axis as  well as in the right lower quadrant. Reproductive: Uterus has been surgically removed. No definitive adnexal mass is noted. Other: The omental thickening and nodularity is similar to that seen  on the previous day. This along with the ascites is again suspicious for peritoneal carcinomatosis. No free air is noted to suggest perforation Musculoskeletal: Degenerative changes of the thoracolumbar spine are noted. No acute abnormality is seen. IMPRESSION: Increase in small pleural effusions and basilar atelectasis when compared with the previous day. Mild increase in the degree of ascites when compared with the previous day. Stable omental thickening and nodular densities suspicious for carcinomatosis. No findings to suggest acute perforation are noted. The inflammatory changes in the right lower quadrant are again identified. The appendix does not appear to be involved. Previously seen gastric distension has resolved. Electronically Signed   By: Oneil Devonshire M.D.   On: 08/31/2024 20:37   DG Abd 1 View Result Date: 08/31/2024 CLINICAL DATA:  Nasogastric tube placement. EXAM: ABDOMEN - 1 VIEW COMPARISON:  Radiograph yesterday FINDINGS: Tip and side port of the enteric tube below the diaphragm in the stomach. Excreted IV contrast in the renal collecting systems from prior CT. No bowel dilatation in the upper abdomen. IMPRESSION: Tip and side port of the enteric tube below the diaphragm in the stomach. Electronically Signed   By: Andrea Gasman M.D.   On: 08/31/2024 14:37   DG Abd 1 View Result Date: 08/30/2024 CLINICAL DATA:  Check gastric catheter placement EXAM: ABDOMEN - 1 VIEW COMPARISON:  None Available. FINDINGS: Gastric catheter is noted in the distal stomach. No free air is seen. No other focal abnormality is noted. IMPRESSION: Gastric catheter within the stomach. Electronically Signed   By: Oneil Devonshire M.D.   On: 08/30/2024 23:09   CT ABDOMEN PELVIS W CONTRAST Result Date: 08/30/2024 CLINICAL DATA:  Acute generalized abdominal pain, hematemesis. EXAM: CT ABDOMEN AND PELVIS WITH CONTRAST TECHNIQUE: Multidetector CT imaging of the abdomen and pelvis was performed using the standard protocol  following bolus administration of intravenous contrast. RADIATION DOSE REDUCTION: This exam was performed according to the departmental dose-optimization program which includes automated exposure control, adjustment of the mA and/or kV according to patient size and/or use of iterative reconstruction technique. CONTRAST:  80mL OMNIPAQUE  IOHEXOL  300 MG/ML  SOLN COMPARISON:  August 29, 2023. FINDINGS: Lower chest: Stable nodule seen in lingular segment left upper lobe. Mild left lingular subsegmental atelectasis or infiltrate is noted. Hepatobiliary: No focal liver abnormality is seen. Status post cholecystectomy. No biliary dilatation. Pancreas: Unremarkable. No pancreatic ductal dilatation or surrounding inflammatory changes. Spleen: Normal in size without focal abnormality. Adrenals/Urinary Tract: Adrenal glands are unremarkable. Kidneys are normal, without renal calculi, focal lesion, or hydronephrosis. Bladder is unremarkable. Stomach/Bowel: Severe gastric distention is noted with moderate wall thickening of gastric antrum suggesting peptic ulcer disease. Severe inflammatory changes are seen involving the cecum suggesting infectious or inflammatory colitis. The appendix appears to be thickened and inflamed suggesting either appendicitis or inflammation secondary to cecal inflammation. Mildly dilated small bowel loops are noted which may represent ileus secondary to inflammation. Vascular/Lymphatic: Aortic atherosclerosis. Enlarged lymph nodes are noted in mesentery of right lower quadrant which most likely are inflammatory in etiology. Reproductive: Status post hysterectomy. No adnexal masses. Other: Mild ascites is noted in the abdomen and pelvis and around the liver and spleen. Nodular densities are noted anteriorly in the pelvis and involving the omentum concerning for peritoneal carcinomatosis. This includes 17 mm lymph node anteriorly on image number 48  of series 2. Musculoskeletal: No acute or significant  osseous findings. IMPRESSION: 1. Severe gastric distention is noted with moderate wall thickening of gastric antrum suggesting peptic ulcer disease. 2. Severe inflammatory changes are seen involving the cecum suggesting infectious or inflammatory colitis. The appendix appears to be thickened and inflamed suggesting either appendicitis or inflammation secondary to cecal inflammation. 3. Mild ascites is noted in the abdomen and pelvis and around the liver and spleen. 4. Nodular densities are noted anteriorly in the pelvis and involving the omentum concerning for peritoneal carcinomatosis. This includes 17 mm lymph node anteriorly. 5. Mildly dilated small bowel loops are noted which may represent ileus secondary to inflammation. 6. Stable nodule seen in lingular segment left upper lobe. Mild left lingular subsegmental atelectasis or infiltrate is noted. 7. Aortic atherosclerosis. Aortic Atherosclerosis (ICD10-I70.0). Electronically Signed   By: Lynwood Landy Raddle M.D.   On: 08/30/2024 18:03    Anti-infectives: Anti-infectives (From admission, onward)    Start     Dose/Rate Route Frequency Ordered Stop   08/31/24 0300  cefTRIAXone (ROCEPHIN) 2 g in sodium chloride  0.9 % 100 mL IVPB        2 g 200 mL/hr over 30 Minutes Intravenous Every 24 hours 08/30/24 2028     08/31/24 0300  metroNIDAZOLE (FLAGYL) IVPB 500 mg        500 mg 100 mL/hr over 60 Minutes Intravenous Every 12 hours 08/30/24 2028     08/30/24 1845  piperacillin-tazobactam (ZOSYN) IVPB 3.375 g        3.375 g 100 mL/hr over 30 Minutes Intravenous  Once 08/30/24 1837 08/30/24 1954       Assessment/Plan:  78 y.o. female presenting with colitis, etiology unclear (possibly infectious) also found to have massive dilation of the stomach with antral thickening   Repeat CT scan yesterday with continued ascites and omental studding concerning for carcinomatosis.  She does have some thickening of the colon.  As far as her likely malignant small bowel  obstruction she is actually starting to have stool.  I think we can try a gravity trial.  However, I would keep her NG tube to gravity for 24 hours we will get a KUB in the morning tomorrow.  As far as her carcinomatosis we do not have tissue diagnosis yet.  At first I would recommend palliative care consult to determine goals of care for the patient.  If they want aggressive care they may need workup such as colonoscopy to determine etiology of carcinomatosis.  I total 35 minutes was spent reviewing the patient's chart, performing history and physical discussing treatment options with the patient  Jayson Endow, M.D. [facility]

## 2024-09-01 NOTE — Plan of Care (Signed)
  Problem: Clinical Measurements: Goal: Respiratory complications will improve Outcome: Progressing Note: Resp even and unlabored. No resp distress noted. She is on RA Goal: Cardiovascular complication will be avoided Outcome: Progressing   Problem: Education: Goal: Knowledge of General Education information will improve Description: Including pain rating scale, medication(s)/side effects and non-pharmacologic comfort measures Outcome: Not Progressing Note: Patient only alert to self   Problem: Health Behavior/Discharge Planning: Goal: Ability to manage health-related needs will improve Outcome: Not Progressing Note: Patient only alert to self   Problem: Clinical Measurements: Goal: Ability to maintain clinical measurements within normal limits will improve Outcome: Not Progressing Note: Patient only alert to self Goal: Will remain free from infection Outcome: Not Progressing Goal: Diagnostic test results will improve Outcome: Not Progressing Note: Potassium has been low and IV potassium has been given   Problem: Nutrition: Goal: Adequate nutrition will be maintained Outcome: Not Progressing Note: Patient remains NPO

## 2024-09-01 NOTE — Progress Notes (Signed)
  PROGRESS NOTE    KYRIANNA BARLETTA  FMW:[id number] DOB: 11-30-1945 DOA: 08/30/2024 PCP: Shara [name], MD  [id number]  LOS: 2 days   Brief hospital course:   Assessment & Plan: Anita Mercer is a 78 y.o. female with medical history significant of neuromuscular disorder, diabetes, GERD, hyperlipidemia, essential hypertension, tenosynovitis, who was brought to the ER by her husband with a complaint of abdominal pain, anorexia and distention.    #1 acute colitis:  --etiology unclear, possibly infectious.  started on zosyn and switched to ceftriaxone and flagyl --cont ceftriaxone and flagyl   #2 markedly distended stomach: Patient has had hematemesis on prior arrival.  Reported by her husband.   --GenSurg consulted.  NGT placed. --CT a/p with oral contrast neg for perforated ulcer.  Gastric distension resolved. --cont NG tube to gravity drain, per GenSurg  #3 possible Peritoneal carcinomatosis:  --GenSurg rec palliative care consult for goals of care discussion, before workup  Hypoglycemia 2/2 NPO --switch from D10 to D5 infusion --BG q4h to monitor for hypoglycemia   #5 type 2 diabetes:  --A1c 6.7.  Will not order insulin .   #6 acute metabolic encephalopathy: Probably due to acute illness.  Patient is delirious.  No prior history of dementia.  Supportive care mainly.   #7 benign essential tremor: Will resume home regimen once oral intake returns.   #8 hyperlipidemia: Will resume home regimen when oral intake returns.  Hypokalemia --supplement with IV K   DVT prophylaxis: SCD/Compression stockings Code Status: Full code  Family Communication:  Level of care: Telemetry Medical Dispo:   The patient is from: home Anticipated d/c is to: to be determined Anticipated d/c date is: to be determined    Subjective and Interval History:  Pt was sleeping a lot, and became agitated when awake.   Objective: Vitals:   09/01/24 0000 09/01/24 0440 09/01/24 1013 09/01/24  1015  BP:  (!) 142/55 (!) 143/69   Pulse: (!) 107 100 94 92  Resp:  20 18   Temp:  98.2 F (36.8 C) 98.6 F (37 C)   TempSrc:  Oral    SpO2:  91% (!) 89% 91%  Weight:      Height:        Intake/Output Summary (Last 24 hours) at 09/01/2024 1715 Last data filed at 09/01/2024 1643 Gross per 24 hour  Intake 65.05 ml  Output 3300 ml  Net -3234.95 ml   Filed Weights   08/30/24 1517  Weight: 65 kg    Examination:   Constitutional: NAD CV: No cyanosis.   RESP: normal respiratory effort, on RA Abdomen: NG tube to gravity drain Foley present.   Data Reviewed: I have personally reviewed labs and imaging studies  Time spent: 35 minutes  Ellouise Haber, MD [facility] If 7PM-7AM, please contact night-coverage 09/01/2024, 5:15 PM

## 2024-09-02 ENCOUNTER — Inpatient Hospital Stay

## 2024-09-02 DIAGNOSIS — Z7189 Other specified counseling: Secondary | ICD-10-CM

## 2024-09-02 DIAGNOSIS — Z515 Encounter for palliative care: Secondary | ICD-10-CM | POA: Diagnosis not present

## 2024-09-02 DIAGNOSIS — F0390 Unspecified dementia without behavioral disturbance: Secondary | ICD-10-CM

## 2024-09-02 DIAGNOSIS — K529 Noninfective gastroenteritis and colitis, unspecified: Secondary | ICD-10-CM | POA: Diagnosis not present

## 2024-09-02 DIAGNOSIS — C786 Secondary malignant neoplasm of retroperitoneum and peritoneum: Secondary | ICD-10-CM | POA: Diagnosis not present

## 2024-09-02 DIAGNOSIS — K920 Hematemesis: Secondary | ICD-10-CM | POA: Diagnosis not present

## 2024-09-02 DIAGNOSIS — Z711 Person with feared health complaint in whom no diagnosis is made: Secondary | ICD-10-CM | POA: Diagnosis not present

## 2024-09-02 LAB — CBC WITH DIFFERENTIAL/PLATELET
Abs Immature Granulocytes: 0.03 K/uL (ref 0.00–0.07)
Basophils Absolute: 0 K/uL (ref 0.0–0.1)
Basophils Relative: 0 %
Eosinophils Absolute: 0.1 K/uL (ref 0.0–0.5)
Eosinophils Relative: 1 %
HCT: 40.8 % (ref 36.0–46.0)
Hemoglobin: 13.1 g/dL (ref 12.0–15.0)
Immature Granulocytes: 1 %
Lymphocytes Relative: 10 %
Lymphs Abs: 0.6 K/uL — ABNORMAL LOW (ref 0.7–4.0)
MCH: 28.1 pg (ref 26.0–34.0)
MCHC: 32.1 g/dL (ref 30.0–36.0)
MCV: 87.4 fL (ref 80.0–100.0)
Monocytes Absolute: 0.4 K/uL (ref 0.1–1.0)
Monocytes Relative: 7 %
Neutro Abs: 4.9 K/uL (ref 1.7–7.7)
Neutrophils Relative %: 81 %
Platelets: 318 K/uL (ref 150–400)
RBC: 4.67 MIL/uL (ref 3.87–5.11)
RDW: 14.2 % (ref 11.5–15.5)
WBC: 6 K/uL (ref 4.0–10.5)
nRBC: 0.3 % — ABNORMAL HIGH (ref 0.0–0.2)

## 2024-09-02 LAB — GLUCOSE, CAPILLARY
Glucose-Capillary: 133 mg/dL — ABNORMAL HIGH (ref 70–99)
Glucose-Capillary: 136 mg/dL — ABNORMAL HIGH (ref 70–99)
Glucose-Capillary: 143 mg/dL — ABNORMAL HIGH (ref 70–99)
Glucose-Capillary: 150 mg/dL — ABNORMAL HIGH (ref 70–99)

## 2024-09-02 LAB — PHOSPHORUS: Phosphorus: 2.5 mg/dL (ref 2.5–4.6)

## 2024-09-02 LAB — POTASSIUM: Potassium: 3.3 mmol/L — ABNORMAL LOW (ref 3.5–5.1)

## 2024-09-02 MED ORDER — POTASSIUM CHLORIDE 10 MEQ/100ML IV SOLN
10.0000 meq | INTRAVENOUS | Status: AC
  Administered 2024-09-02 (×2): 10 meq via INTRAVENOUS
  Filled 2024-09-02 (×2): qty 100

## 2024-09-02 NOTE — Plan of Care (Signed)
 Patient continues to have sitter for safety measures.  Noted to have NG tube to (L) nare upon beginning of current shift.  Towards the end of the shift, the patient was able to remove the tube.  Assigned/scripting RN was able to successfully reinsert a new NG tube, per the current MD order, to maintain the tube in place.  Upon insertion of the tube, the patient was able to remove the newly inserted tube.  On call MD, and charge nurse made aware of events with patient.  Per on call MD, in the hospital, the tube will remain out, until morning staff arrives, as well as the ordering physician will be made aware.  Patient remains free from any signs of acute GI distress.  Foley catheter remains intact, draining adequate amount of amber colored urine, without difficulty.  Patient to continue to be monitored by hospital staff until discharged.

## 2024-09-02 NOTE — Progress Notes (Signed)
 Daily Progress Note   Patient Name: Anita Mercer      Date: 09/02/2024 DOB: [birth year]  Age: 78 y.o. MRN#: [id number] Attending Physician: Awanda City, MD Primary Care Physician: Shara [name], MD Admit Date: 08/30/2024  Reason for Consultation/Follow-up: Establishing goals of care  HPI/Brief Hospital Review:  78 y.o. female  with past medical history of diabetes, hypertension, hyperlipidemia, peripheral neuropathy, dementia followed by outpatient neurology, chronic abdominal pain followed by GI as outpatient admitted from home on 08/30/2024 with abdominal pain with associated distention, constipation, nausea and decreased appetite, has had 1 episode of emesis reportedly symptoms have been persisting for about 2 to 3 weeks.  Clinical picture remains unclear.   CT scan obtained revealing ascites in the right upper quadrant, distended stomach and inflammation Admitted and being treated for colitis with unclear etiology--massive dilation of the stomach with antral thickening, NG tube placed for decompression, antibiotic therapy initiated along with IV fluid resuscitation Repeat CT scan with contrast reveals concern for continued ascites and peritoneal carcinomatosis and thickening of ascending colon--General Surgery following, recommends goals of care conversations be had with high degree of concern for malignancy   Palliative medicine was consulted for assisting with goals of care conversations.  Subjective: Extensive chart review has been completed prior to meeting patient including labs, vital signs, imaging, progress notes, orders, and available advanced directive documents from current and previous encounters.    Visited with Anita Mercer at her bedside.  She is resting comfortably in bed,  does not acknowledge my presence in the room.  During conversations with husband at bedside Anita Mercer occasionally groans and verbalizes complaints of abdominal pain or leg pain but moments later drifts back off to sleep without apparent signs of ongoing distress or discomfort.  Sitter remains at bedside.  Mitts remain in place.  Noted NG tube had been removed by Anita Mercer overnight, per chart review nursing attempted 2-3 times to replace without success.  Met with husband Gustav at bedside.  He provides a brief life review.  He and Anita Mercer have been together close to 60 years.  They have 1 daughter who lives in [place] . 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  Mr. Gustav shares they have lived a very active and social life.  They  are originally from [place]  but has been in [place]  for many years.  Over the years they have also enjoyed traveling.  Mr. Gustav describes prior functional status and cognitive baseline.  Mr. Gustav shares over the last year to year and a half Anita Mercer spends about 85% of her day resting in her recliner or bed.  They do frequently dining out for lunch or dinner with friends and she will be up about a total of 3 hours but returns to bed when they get home.  She will also spend about an hour or so with him during meals at home and watch an occasional television show but then returns to bed.  She remains independent in completing her ADLs besides assistance from Mr. Gustav with bathing.  He notes a significant decline in her functional status as prior to the course of the year she had remained very active and was very creative and spent a lot of time in her art room--has not participated in craft activities in well over a year.  Mr. Gustav able to provide his understanding of current medical situation.  Aware of abdominal pain that started about 2 to 3 weeks prior to admission but also notes chronic issues with abdominal pain and  has been followed by GI in the past.  He notes a significant change in condition when she began complaining of nausea and vomiting prompting evaluation.  Mr. Gustav shares the team remains unclear on etiology of clinical presentation, aware she is being treated with IV fluid resuscitation as well as IV antibiotics for possible infectious colitis.  Mr. Gustav shares from reading MyChart he is also aware of the concern for underlying adenocarcinoma.  Detailed discussions were had regarding quality of life versus quantity of life, natural chronic disease progression as it relates to her underlying dementia as well as end-of-life expectations.  We discussed the progressive nature of dementia and the decline in her cognitive and functional status over the course of the year could be attributed to a progression of her dementia.  Detailed discussions were had related to CODE STATUS and the difference between full code and DO NOT RESUSCITATE. Encouraged family to consider DNR/DNI status understanding evidenced based poor outcomes in similar hospitalized patients, as the cause of the arrest is likely associated with chronic/terminal disease rather than a reversible acute cardio-pulmonary event.  Detailed the likelihood of a further decline of functional and cognitive status in the event of arrest.  At this time Mr. Gustav unsure on his thoughts regarding CODE STATUS, at this time wishes for full code to remain.  He has clear Anita Mercer would not be accepting of long-term life prolonging measures.  We further discussed goals of care related to suspicion for underlying malignancy and next steps.  We discussed potential for proceeding with further workup including scope with biopsy, we also discussed risks associated with this as well as concern related to Anita Mercer being able to participate given her significant cognitive impairment.  We again discussed quality versus quantity of life and keeping Anita Mercer's wishes and values  at the center of all decision making.  Mr. Gustav understandably requesting time for information processing.  Mr. Gustav would benefit from having collaborative conversations with PMT, general surgery and primary team.  Mr. Gustav appreciative of conversation and feels he has a good understanding of things discussed. He shares once more information is obtained he will then likely involve their daughter. Encouraged Mr. Gustav to reach out to PMT service  with any questions or concerns that arise.  At conclusion of our discussion, met with primary attending outside of room.  She further was able to engage in conversations with Mr. Gustav.  He is also requested a note from primary team stating his wife is in the hospital as they have paid and booked a trip for the end of this month and requests a note for him to be able to get a refund.  Answered and addressed all questions and concerns.  PMT to continue to follow for ongoing needs and support---will attempt collaborative meeting tomorrow, primary team to engage also with oncology.  Objective:  Physical Exam Constitutional:      General: She is not in acute distress.    Appearance: She is ill-appearing.  HENT:     Head: Normocephalic.  Pulmonary:     Effort: Pulmonary effort is normal. No respiratory distress.  Abdominal:     General: There is distension.     Tenderness: There is abdominal tenderness.  Skin:    General: Skin is warm and dry.  Neurological:     Mental Status: She is lethargic and disoriented.     Motor: Weakness present.  Psychiatric:        Behavior: Behavior is withdrawn.        Cognition and Memory: Cognition is impaired. Memory is impaired.             Vital Signs: BP (!) 156/77 (BP Location: Right Arm)   Pulse 86   Temp (!) 97.5 F (36.4 C) (Axillary)   Resp 14   Ht 5' 1 (1.549 m)   Wt 65 kg   SpO2 94%   BMI 27.08 kg/m  SpO2: SpO2: 94 % O2 Device: O2 Device: Room Air O2 Flow Rate:     Palliative Care  Assessment & Plan   Assessment/Recommendation/Plan  Attempt collaboration with PMT, oncology, GI and general surgery Husband requests time for information processing Continue current plan of care PMT to continue to follow and support closely  Care plan was discussed with primary team.  Thank you for allowing the Palliative Medicine Team to assist in the care of this patient.  Visit  includes: Detailed review of medical records (labs, imaging, vital signs), medically appropriate exam (mental status, respiratory, cardiac, skin), discussed with treatment team, counseling and educating patient, family and staff, documenting clinical information, medication management and coordination of care.  Waddell Lesches, DNP, AGNP-C Palliative Medicine   Please contact Palliative Medicine Team phone at [phone] for questions and concerns.

## 2024-09-02 NOTE — Progress Notes (Addendum)
 CC: peritoneal carcinomatosis  Subjective: Chart reviewed and patient came in with distended stomach with ascites.  Overnight patient pulled out her NG tube.  She had a bowel movement recorded this morning.  Palliative care team unable to get in touch with patient's husband to discuss goals of care.  Objective: Vital signs in last 24 hours: Temp:  [97.5 F (36.4 C)] 97.5 F (36.4 C) (10/12 0900) Pulse Rate:  [86-90] 86 (10/12 0900) Resp:  [14-16] 14 (10/12 0900) BP: (156-158)/(72-77) 156/77 (10/12 0900) SpO2:  [88 %-94 %] 94 % (10/12 0900) Last BM Date :  (NG tube to low interm. suction)  Intake/Output from previous day: 10/11 0701 - 10/12 0700 In: -  Out: 800 [Urine:800] Intake/Output this shift: No intake/output data recorded.  Physical exam:  Confused but not combative this morning.  Abdomen is nondistended and soft.  Mittens in place  Lab Results: CBC  Recent Labs    08/30/24 1527 08/31/24 0449  WBC 11.4* 7.7  HGB 16.1* 13.4  HCT 52.6* 41.7  PLT 538* 427*   BMET Recent Labs    08/31/24 0449 09/01/24 0346 09/02/24 0625  NA 140 143  --   K 3.4* 2.9* 3.3*  CL 107 107  --   CO2 24 27  --   GLUCOSE 67* 136*  --   BUN 35* 22  --   CREATININE 1.04* 0.85  --   CALCIUM 8.6* 8.0*  --    PT/INR Recent Labs    08/30/24 2229  LABPROT 13.8  INR 1.0   ABG No results for input(s): PHART, HCO3 in the last 72 hours.  Invalid input(s): PCO2, PO2  Studies/Results: DG Abd 1 View Result Date: 09/02/2024 EXAM: 1 VIEW XRAY OF THE ABDOMEN 09/02/2024 05:20:28 AM COMPARISON: 08/31/2024 CLINICAL HISTORY: Abdominal distention FINDINGS: LINES, TUBES AND DEVICES: Enteric tube in place with tip and side port projecting over the stomach. BOWEL: Centralized bowel loops within the lower abdomen, which can be seen in the setting of ascites. Nonobstructive bowel gas pattern. SOFT TISSUES: Vascular calcifications. Surgical material in the right hemiabdomen. No opaque urinary  calculi. BONES: Degenerative changes of the lumbar spine. No acute osseous abnormality. IMPRESSION: 1. Centralized bowel loops within the lower abdomen, which can be seen with ascites 2. Enteric tube tip and side port projecting over the stomach Electronically signed by: Waddell Calk MD 09/02/2024 09:10 AM EDT RP Workstation: [id number]   CT ABDOMEN PELVIS WO CONTRAST Result Date: 08/31/2024 CLINICAL DATA:  Diffuse abdominal pain EXAM: CT ABDOMEN AND PELVIS WITHOUT CONTRAST TECHNIQUE: Multidetector CT imaging of the abdomen and pelvis was performed following the standard protocol without IV contrast. RADIATION DOSE REDUCTION: This exam was performed according to the departmental dose-optimization program which includes automated exposure control, adjustment of the mA and/or kV according to patient size and/or use of iterative reconstruction technique. COMPARISON:  CT from the previous day. FINDINGS: Lower chest: Small pleural effusions are noted bilaterally with associated basilar atelectasis. This has increased slightly in the interval from the prior exam. Hepatobiliary: The degree of perihepatic fluid appears to have increased slightly in the interval from the prior exam. No definitive hepatic mass is seen. The gallbladder has been surgically removed. Pancreas: Unremarkable. No pancreatic ductal dilatation or surrounding inflammatory changes. Spleen: Spleen is within normal limits although increased perisplenic fluid is noted when compared with the prior study. Adrenals/Urinary Tract: Adrenal glands are within normal limits. Kidneys demonstrate excretion from prior CT examination. No mass lesion is seen. No obstructive changes  are noted. The bladder is decompressed by Foley catheter. Stomach/Bowel: Scattered mild diverticular change of the colon is noted. Fluid is noted throughout the colon it may be related to an underlying diarrheal state. No obstructive changes are seen. The appendix is well visualized and  similar to that seen on the prior exam. No perforation is identified. Some inflammatory changes noted in the right lower quadrant similar to that seen on prior exam. This may simply be related to underlying inflammatory change of the cecum. No obstructive changes of the small bowel are noted. Stomach is well distended with a gastric catheter within. Vascular/Lymphatic: Atherosclerotic calcifications are identified. Scattered lymph nodes are noted surrounding the celiac axis as well as in the right lower quadrant. Reproductive: Uterus has been surgically removed. No definitive adnexal mass is noted. Other: The omental thickening and nodularity is similar to that seen on the previous day. This along with the ascites is again suspicious for peritoneal carcinomatosis. No free air is noted to suggest perforation Musculoskeletal: Degenerative changes of the thoracolumbar spine are noted. No acute abnormality is seen. IMPRESSION: Increase in small pleural effusions and basilar atelectasis when compared with the previous day. Mild increase in the degree of ascites when compared with the previous day. Stable omental thickening and nodular densities suspicious for carcinomatosis. No findings to suggest acute perforation are noted. The inflammatory changes in the right lower quadrant are again identified. The appendix does not appear to be involved. Previously seen gastric distension has resolved. Electronically Signed   By: Oneil Devonshire M.D.   On: 08/31/2024 20:37    Anti-infectives: Anti-infectives (From admission, onward)    Start     Dose/Rate Route Frequency Ordered Stop   08/31/24 0300  cefTRIAXone (ROCEPHIN) 2 g in sodium chloride  0.9 % 100 mL IVPB        2 g 200 mL/hr over 30 Minutes Intravenous Every 24 hours 08/30/24 2028     08/31/24 0300  metroNIDAZOLE (FLAGYL) IVPB 500 mg        500 mg 100 mL/hr over 60 Minutes Intravenous Every 12 hours 08/30/24 2028     08/30/24 1845  piperacillin-tazobactam (ZOSYN)  IVPB 3.375 g        3.375 g 100 mL/hr over 30 Minutes Intravenous  Once 08/30/24 1837 08/30/24 1954       KUB with nonobstructive bowel gas pattern.  Assessment/Plan:  78 y.o. female presenting with colitis, etiology unclear (possibly infectious) also found to have massive dilation of the stomach with antral thickening   Repeat CT scan  with continued ascites and omental studding concerning for carcinomatosis.  She does have some thickening of the colon.  As far as her likely malignant small bowel obstruction she is actually starting to have stool.  Her KUB this morning showed nonobstructive bowel gas pattern.  She is having stool.  If safe from the primary team's view can start on diet but will defer to them.  As far as long-term plan I think it is prudent that we discussed the goals of care given her dementia and cognitive status.  Workup for this carcinomatosis would likely involve sampling of the ascitic fluid as well as possible colonoscopy to determine source of malignancy.  Agree with antibiotics for possible colitis.  A total 35 minutes was spent reviewing the patient's chart, performing history and physical discussing treatment options with the patient  Jayson Endow, M.D. [facility]

## 2024-09-02 NOTE — Progress Notes (Signed)
  PROGRESS NOTE    Anita Mercer  FMW:[id number] DOB: Mar 24, 1946 DOA: 08/30/2024 PCP: Shara [name], MD  [id number]  LOS: 3 days   Brief hospital course:   Assessment & Plan: Anita Mercer is a 78 y.o. female with medical history significant of neuromuscular disorder, diabetes, GERD, hyperlipidemia, essential hypertension, tenosynovitis, who was brought to the ER by her husband with a complaint of abdominal pain, anorexia and distention.    #1 acute colitis:  --etiology unclear, possibly infectious.  started on zosyn and switched to ceftriaxone and flagyl --cont ceftriaxone and flagyl   #2 markedly distended stomach: Patient has had hematemesis on prior arrival.  Reported by her husband.   --GenSurg consulted.  NGT placed. --CT a/p with oral contrast neg for perforated ulcer.  Gastric distension resolved.  Pt started having stool. --pt pulled out NG tube overnight, will not re-insert. --start diet if pt is alert enough for oral intake  #3 possible Peritoneal carcinomatosis:  --GenSurg rec palliative care consult for goals of care discussion, before workup --husband desired workup.   --consult oncology tomorrow.    Hypoglycemia 2/2 NPO --cont D5 infusion --BG q4h to monitor for hypoglycemia --start diet if pt is alert enough for oral intake   #5 type 2 diabetes:  --A1c 6.7.  Will not order insulin .   #6 acute metabolic encephalopathy:  Baseline dementia --husband confirmed pt has baseline dementia, but mental status is worse than baseline.   #7 benign essential tremor:  Will resume home regimen once oral intake returns.   #8 hyperlipidemia:  Will resume home regimen when oral intake returns.  Hypokalemia --supplement with IV K   DVT prophylaxis: SCD/Compression stockings Code Status: Full code  Family Communication: husband updated at bedside today Level of care: Telemetry Medical Dispo:   The patient is from: home Anticipated d/c is to: to be  determined Anticipated d/c date is: to be determined    Subjective and Interval History:  Pt pulled out NG tube twice overnight, and pulled out IV also.   Objective: Vitals:   09/01/24 1015 09/02/24 0438 09/02/24 0441 09/02/24 0900  BP:  (!) 158/72  (!) 156/77  Pulse: 92 86 90 86  Resp:  16  14  Temp:    (!) 97.5 F (36.4 C)  TempSrc:    Axillary  SpO2: 91% (!) 88% 90% 94%  Weight:      Height:        Intake/Output Summary (Last 24 hours) at 09/02/2024 1608 Last data filed at 09/02/2024 1515 Gross per 24 hour  Intake 1200.12 ml  Output 200 ml  Net 1000.12 ml   Filed Weights   08/30/24 1517  Weight: 65 kg    Examination:   Constitutional: NAD CV: No cyanosis.   RESP: normal respiratory effort, on RA Extremities: No effusions, edema in BLE SKIN: warm, dry  Foley present.   Data Reviewed: I have personally reviewed labs and imaging studies  Time spent: 50 minutes  Ellouise Haber, MD [facility] If 7PM-7AM, please contact night-coverage 09/02/2024, 4:08 PM

## 2024-09-03 ENCOUNTER — Inpatient Hospital Stay

## 2024-09-03 DIAGNOSIS — Z794 Long term (current) use of insulin: Secondary | ICD-10-CM

## 2024-09-03 DIAGNOSIS — E785 Hyperlipidemia, unspecified: Secondary | ICD-10-CM

## 2024-09-03 DIAGNOSIS — C786 Secondary malignant neoplasm of retroperitoneum and peritoneum: Secondary | ICD-10-CM | POA: Diagnosis not present

## 2024-09-03 DIAGNOSIS — Z789 Other specified health status: Secondary | ICD-10-CM | POA: Diagnosis not present

## 2024-09-03 DIAGNOSIS — K529 Noninfective gastroenteritis and colitis, unspecified: Secondary | ICD-10-CM | POA: Diagnosis not present

## 2024-09-03 DIAGNOSIS — Z515 Encounter for palliative care: Secondary | ICD-10-CM | POA: Diagnosis not present

## 2024-09-03 DIAGNOSIS — K567 Ileus, unspecified: Secondary | ICD-10-CM

## 2024-09-03 DIAGNOSIS — E1169 Type 2 diabetes mellitus with other specified complication: Secondary | ICD-10-CM

## 2024-09-03 DIAGNOSIS — E1142 Type 2 diabetes mellitus with diabetic polyneuropathy: Secondary | ICD-10-CM

## 2024-09-03 DIAGNOSIS — Z7189 Other specified counseling: Secondary | ICD-10-CM | POA: Diagnosis not present

## 2024-09-03 DIAGNOSIS — I10 Essential (primary) hypertension: Secondary | ICD-10-CM

## 2024-09-03 DIAGNOSIS — G250 Essential tremor: Secondary | ICD-10-CM

## 2024-09-03 LAB — COMPREHENSIVE METABOLIC PANEL WITH GFR
ALT: 14 U/L (ref 0–44)
AST: 19 U/L (ref 15–41)
Albumin: 2.4 g/dL — ABNORMAL LOW (ref 3.5–5.0)
Alkaline Phosphatase: 67 U/L (ref 38–126)
Anion gap: 24 — ABNORMAL HIGH (ref 5–15)
BUN: 17 mg/dL (ref 8–23)
CO2: 17 mmol/L — ABNORMAL LOW (ref 22–32)
Calcium: 8.5 mg/dL — ABNORMAL LOW (ref 8.9–10.3)
Chloride: 107 mmol/L (ref 98–111)
Creatinine, Ser: 0.93 mg/dL (ref 0.44–1.00)
GFR, Estimated: >60 mL/min (ref >60)
Glucose, Bld: 176 mg/dL — ABNORMAL HIGH (ref 70–99)
Potassium: 4.1 mmol/L (ref 3.5–5.1)
Sodium: 148 mmol/L — ABNORMAL HIGH (ref 135–145)
Total Bilirubin: 0.5 mg/dL (ref 0.0–1.2)
Total Protein: 5.9 g/dL — ABNORMAL LOW (ref 6.5–8.1)

## 2024-09-03 LAB — GLUCOSE, CAPILLARY: Glucose-Capillary: 183 mg/dL — ABNORMAL HIGH (ref 70–99)

## 2024-09-03 LAB — CBC
HCT: 39 % (ref 36.0–46.0)
Hemoglobin: 12.3 g/dL (ref 12.0–15.0)
MCH: 27.3 pg (ref 26.0–34.0)
MCHC: 31.5 g/dL (ref 30.0–36.0)
MCV: 86.7 fL (ref 80.0–100.0)
Platelets: 300 K/uL (ref 150–400)
RBC: 4.5 MIL/uL (ref 3.87–5.11)
RDW: 14.3 % (ref 11.5–15.5)
WBC: 5.9 K/uL (ref 4.0–10.5)
nRBC: 0 % (ref 0.0–0.2)

## 2024-09-03 LAB — PROTIME-INR
INR: 1.1 (ref 0.8–1.2)
Prothrombin Time: 15.2 s (ref 11.4–15.2)

## 2024-09-03 LAB — MAGNESIUM: Magnesium: 1.8 mg/dL (ref 1.7–2.4)

## 2024-09-03 MED ORDER — LIDOCAINE HCL (PF) 1 % IJ SOLN
10.0000 mL | Freq: Once | INTRAMUSCULAR | Status: AC
  Administered 2024-09-03: 10 mL via INTRADERMAL

## 2024-09-03 MED ORDER — SODIUM CHLORIDE 0.9 % IV SOLN: INTRAVENOUS | Status: DC

## 2024-09-03 NOTE — Procedures (Signed)
 PROCEDURE SUMMARY:  Successful US  guided paracentesis from left lateral abdomen.  Yielded of serous fluid.  No immediate complications.  Patient tolerated well.  EBL = trace  Specimen sent for labs.  Ege Muckey CHRISTELLA Bal PA-C 09/03/2024 5:17 PM

## 2024-09-03 NOTE — Progress Notes (Signed)
 Returned from [employer] alert and responsive. Vss. No c/o pain or discomfort noted.

## 2024-09-03 NOTE — Consult Note (Signed)
 Hematology/Oncology Consult note [facility] [phone] [phone]  Patient Care Team: Shara [name], MD as PCP - General (Internal Medicine) Claudene Arthea HERO, DO as Attending Physician (Physical Medicine and Rehabilitation)   Name of the patient: Anita Mercer  [id number]  03-23-1946    Reason for referral- ***   Referring physician- ***  Date of visit: @TODAY @   History of presenting illness- ***  ECOG PS- ***  Pain scale- ***   Review of systems- ROS  Allergies  Allergen Reactions  . Gabapentin  Other (See Comments)    Experienced severe pain in limbs and tingling in hands  Other Reaction(s): Not available  Severe pain in limbs and tingling in hands  Severe pain in limbs and tingling in hands    Experienced severe pain in limbs and tingling in hands  . Ciprofloxacin      Worsening symptoms of neuropathy  . Conjugated Estrogens      Severe itching  . Omeprazole Itching, Swelling and Other (See Comments)  . Proton Pump Inhibitors     Other reaction(s): Angioedema--prilosec     Patient Active Problem List   Diagnosis Date Noted  . Colitis 08/30/2024  . Delirium due to multiple etiologies, acute, hyperactive 08/30/2024  . Suspected Peritoneal carcinomatosis (HCC) 08/30/2024  . B12 deficiency 07/12/2016  . Gastroesophageal reflux disease with esophagitis 07/12/2016  . Osteopenia determined by x-ray 07/09/2016  . De Quervain's tenosynovitis, right 06/17/2016  . Chronic fatigue 06/16/2016  . Hearing loss 05/28/2014  . Benign essential tremor 05/10/2013  . Allergic rhinitis 03/15/2013  . Neuropathic pain 09/11/2012  . Paroxysmal nerve pain 09/11/2012  . Hyperlipidemia associated with type 2 diabetes mellitus (HCC) 10/01/2011  . Essential hypertension 10/01/2011  . Type 2 diabetes mellitus with diabetic polyneuropathy, with long-term current use of insulin  (HCC) 10/01/2011     Past Medical History:   Diagnosis Date  . Actinic keratosis   . Adopted   . Cataract   . Diabetes mellitus 2000  . GERD (gastroesophageal reflux disease)   . Hyperlipidemia   . Hypertension   . Neuromuscular disorder (HCC)    peripheral neuropathy  . RMSF [facility] spotted fever)   . Sinusitis   . Tenosynovitis      Past Surgical History:  Procedure Laterality Date  . ABDOMINAL HYSTERECTOMY    . ACHILLES TENDON SURGERY  2006   removela of bone spur  . BUNIONECTOMY    . CATARACT EXTRACTION Bilateral 10/18/11  04/04/13  . CHOLECYSTECTOMY    . COLONOSCOPY  01/13/2011  . COLONOSCOPY WITH PROPOFOL  N/A 08/11/2015   Procedure: COLONOSCOPY WITH PROPOFOL ;  Surgeon: Gladis RAYMOND Mariner, MD;  Location: [facility] ENDOSCOPY;  Service: Endoscopy;  Laterality: N/A;  . echo  10/23/2012  . ESOPHAGOGASTRODUODENOSCOPY (EGD) WITH PROPOFOL  N/A 08/11/2015   Procedure: ESOPHAGOGASTRODUODENOSCOPY (EGD) WITH PROPOFOL ;  Surgeon: Gladis RAYMOND Mariner, MD;  Location: [facility] ENDOSCOPY;  Service: Endoscopy;  Laterality: N/A;  . FOOT SURGERY     Dr. Magdalen, bone spurs  . GALLBLADDER SURGERY    . skin tags    . UPPER GASTROINTESTINAL ENDOSCOPY  08/08/2015   with MAC- barretts and duodenitis    Social History   Socioeconomic History  . Marital status: Married    Spouse name: Not on file  . Number of children: 1  . Years of education: Not on file  . Highest education level: Not on file  Occupational History  . Occupation: Retired Production assistant, radio    Comment: 31 yrs  [employer]  Tobacco Use  . Smoking status: Never  . Smokeless tobacco: Never  Substance and Sexual Activity  . Alcohol use: No  . Drug use: No  . Sexual activity: Never  Other Topics Concern  . Not on file  Social History Narrative   Lives in [place] with husband. Moved from [place]. Orig from [place]. Daughter lives in [place] . Dogs at home. Engineer, manufacturing at [employer]. Hobbies - stained glass, woodcarving.      Diet: regular, limited carbs.  Daily Caffeine Use:  Coffee 2-3   Regular Exercise -  Daily, yardwork   Social Drivers of Health   Financial Resource Strain: Low Risk  (03/23/2024)   Received from [facility]   Overall Financial Resource Strain (CARDIA)   . Difficulty of Paying Living Expenses: Not hard at all  Food Insecurity: Patient Unable To Answer (08/31/2024)   Hunger Vital Sign   . Worried About Programme researcher, broadcasting/film/video in the Last Year: Patient unable to answer   . Ran Out of Food in the Last Year: Patient unable to answer  Transportation Needs: Patient Unable To Answer (08/31/2024)   PRAPARE - Transportation   . Lack of Transportation (Medical): Patient unable to answer   . Lack of Transportation (Non-Medical): Patient unable to answer  Physical Activity: Inactive (03/13/2024)   Received from [facility]   Exercise Vital Sign   . On average, how many days per week do you engage in moderate to strenuous exercise (like a brisk walk)?: 0 days   . On average, how many minutes do you engage in exercise at this level?: 0 min  Stress: Stress Concern Present (03/13/2024)   Received from [facility] - Occupational Stress Questionnaire   . Feeling of Stress : Very much  Social Connections: Patient Unable To Answer (08/31/2024)   Social Connection and Isolation Panel   . Frequency of Communication with Friends and Family: Patient unable to answer   . Frequency of Social Gatherings with Friends and Family: Patient unable to answer   . Attends Religious Services: Patient unable to answer   . Active Member of Clubs or Organizations: Patient unable to answer   . Attends Banker Meetings: Patient unable to answer   . Marital Status: Patient unable to answer  Intimate Partner Violence: Patient Unable To Answer (08/31/2024)   Humiliation, Afraid, Rape, and Kick questionnaire   . Fear of Current or Ex-Partner: Patient unable to answer   .  Emotionally Abused: Patient unable to answer   . Physically Abused: Patient unable to answer   . Sexually Abused: Patient unable to answer     Family History  Adopted: Yes     Current Facility-Administered Medications:  .  0.9 %  sodium chloride  infusion, , Intravenous, Continuous, Pabon, [place] F, MD, Last Rate: 100 mL/hr at 09/03/24 1043, New Bag at 09/03/24 1043 .  cefTRIAXone (ROCEPHIN) 2 g in sodium chloride  0.9 % 100 mL IVPB, 2 g, Intravenous, Q24H, Garba, Mohammad L, MD, Last Rate: 200 mL/hr at 09/03/24 0353, 2 g at 09/03/24 0353 .  Chlorhexidine Gluconate Cloth 2 % PADS 6 each, 6 each, Topical, Daily, [place], MD, 6 each at 09/03/24 [phone] .  diazepam (VALIUM) injection 5 mg, 5 mg, Intravenous, Q4H PRN, Sim Re L, MD, 5 mg at 09/01/24 0248 .  metroNIDAZOLE (FLAGYL) IVPB 500 mg, 500 mg, Intravenous, Q12H, Garba, Mohammad L, MD, Last Rate: 100  mL/hr at 09/03/24 1623, 500 mg at 09/03/24 1623 .  morphine (PF) 2 MG/ML injection 2 mg, 2 mg, Intravenous, Q2H PRN, Garba, Mohammad L, MD, 2 mg at 09/03/24 1241 .  ondansetron (ZOFRAN) tablet 4 mg, 4 mg, Oral, Q6H PRN **OR** ondansetron (ZOFRAN) injection 4 mg, 4 mg, Intravenous, Q6H PRN, Sim Emery CROME, MD   Physical exam:  Vitals:   09/02/24 2016 09/03/24 0355 09/03/24 0731 09/03/24 1543  BP: (!) 152/77 (!) 159/81 (!) 149/76 (!) 144/81  Pulse: 91 96 88 (!) 101  Resp:   18 17  Temp: 98.1 F (36.7 C) 98.2 F (36.8 C) 97.8 F (36.6 C) 97.6 F (36.4 C)  TempSrc: Oral Axillary  Axillary  SpO2:  96% 94% 96%  Weight:      Height:       Physical Exam        Latest Ref Rng & Units 09/03/2024    5:53 AM  CMP  Glucose 70 - 99 mg/dL 823   BUN 8 - 23 mg/dL 17   Creatinine 9.55 - 1.00 mg/dL 9.06   Sodium 864 - 854 mmol/L 148   Potassium 3.5 - 5.1 mmol/L 4.1   Chloride 98 - 111 mmol/L 107   CO2 22 - 32 mmol/L 17   Calcium 8.9 - 10.3 mg/dL 8.5   Total Protein 6.5 - 8.1 g/dL 5.9   Total Bilirubin 0.0 - 1.2 mg/dL 0.5    Alkaline Phos 38 - 126 U/L 67   AST 15 - 41 U/L 19   ALT 0 - 44 U/L 14       Latest Ref Rng & Units 09/03/2024    7:57 AM  CBC  WBC 4.0 - 10.5 K/uL 5.9   Hemoglobin 12.0 - 15.0 g/dL 87.6   Hematocrit 63.9 - 46.0 % 39.0   Platelets 150 - 400 K/uL 300     @IMAGES @  DG Abd Portable 1V Result Date: 09/03/2024 CLINICAL DATA:  Small bowel obstruction. EXAM: PORTABLE ABDOMEN - 1 VIEW COMPARISON:  09/02/2024 FINDINGS: Minimal gaseous distention of small bowel in the left abdomen identified measuring up to 3.0 cm diameter. Gas is seen scattered along the length of a nondilated colon. Diffuse degenerative change in the lumbar spine. IMPRESSION: Minimal gaseous distention of small bowel in the left abdomen. Electronically Signed   By: Camellia Candle M.D.   On: 09/03/2024 10:14   DG Abd 1 View Result Date: 09/02/2024 EXAM: 1 VIEW XRAY OF THE ABDOMEN 09/02/2024 05:20:28 AM COMPARISON: 08/31/2024 CLINICAL HISTORY: Abdominal distention FINDINGS: LINES, TUBES AND DEVICES: Enteric tube in place with tip and side port projecting over the stomach. BOWEL: Centralized bowel loops within the lower abdomen, which can be seen in the setting of ascites. Nonobstructive bowel gas pattern. SOFT TISSUES: Vascular calcifications. Surgical material in the right hemiabdomen. No opaque urinary calculi. BONES: Degenerative changes of the lumbar spine. No acute osseous abnormality. IMPRESSION: 1. Centralized bowel loops within the lower abdomen, which can be seen with ascites 2. Enteric tube tip and side port projecting over the stomach Electronically signed by: Waddell Calk MD 09/02/2024 09:10 AM EDT RP Workstation: [id number]   CT ABDOMEN PELVIS WO CONTRAST Result Date: 08/31/2024 CLINICAL DATA:  Diffuse abdominal pain EXAM: CT ABDOMEN AND PELVIS WITHOUT CONTRAST TECHNIQUE: Multidetector CT imaging of the abdomen and pelvis was performed following the standard protocol without IV contrast. RADIATION DOSE REDUCTION: This  exam was performed according to the departmental dose-optimization program which includes automated exposure control, adjustment of  the mA and/or kV according to patient size and/or use of iterative reconstruction technique. COMPARISON:  CT from the previous day. FINDINGS: Lower chest: Small pleural effusions are noted bilaterally with associated basilar atelectasis. This has increased slightly in the interval from the prior exam. Hepatobiliary: The degree of perihepatic fluid appears to have increased slightly in the interval from the prior exam. No definitive hepatic mass is seen. The gallbladder has been surgically removed. Pancreas: Unremarkable. No pancreatic ductal dilatation or surrounding inflammatory changes. Spleen: Spleen is within normal limits although increased perisplenic fluid is noted when compared with the prior study. Adrenals/Urinary Tract: Adrenal glands are within normal limits. Kidneys demonstrate excretion from prior CT examination. No mass lesion is seen. No obstructive changes are noted. The bladder is decompressed by Foley catheter. Stomach/Bowel: Scattered mild diverticular change of the colon is noted. Fluid is noted throughout the colon it may be related to an underlying diarrheal state. No obstructive changes are seen. The appendix is well visualized and similar to that seen on the prior exam. No perforation is identified. Some inflammatory changes noted in the right lower quadrant similar to that seen on prior exam. This may simply be related to underlying inflammatory change of the cecum. No obstructive changes of the small bowel are noted. Stomach is well distended with a gastric catheter within. Vascular/Lymphatic: Atherosclerotic calcifications are identified. Scattered lymph nodes are noted surrounding the celiac axis as well as in the right lower quadrant. Reproductive: Uterus has been surgically removed. No definitive adnexal mass is noted. Other: The omental thickening and  nodularity is similar to that seen on the previous day. This along with the ascites is again suspicious for peritoneal carcinomatosis. No free air is noted to suggest perforation Musculoskeletal: Degenerative changes of the thoracolumbar spine are noted. No acute abnormality is seen. IMPRESSION: Increase in small pleural effusions and basilar atelectasis when compared with the previous day. Mild increase in the degree of ascites when compared with the previous day. Stable omental thickening and nodular densities suspicious for carcinomatosis. No findings to suggest acute perforation are noted. The inflammatory changes in the right lower quadrant are again identified. The appendix does not appear to be involved. Previously seen gastric distension has resolved. Electronically Signed   By: Oneil Devonshire M.D.   On: 08/31/2024 20:37   DG Abd 1 View Result Date: 08/31/2024 CLINICAL DATA:  Nasogastric tube placement. EXAM: ABDOMEN - 1 VIEW COMPARISON:  Radiograph yesterday FINDINGS: Tip and side port of the enteric tube below the diaphragm in the stomach. Excreted IV contrast in the renal collecting systems from prior CT. No bowel dilatation in the upper abdomen. IMPRESSION: Tip and side port of the enteric tube below the diaphragm in the stomach. Electronically Signed   By: Andrea Gasman M.D.   On: 08/31/2024 14:37   DG Abd 1 View Result Date: 08/30/2024 CLINICAL DATA:  Check gastric catheter placement EXAM: ABDOMEN - 1 VIEW COMPARISON:  None Available. FINDINGS: Gastric catheter is noted in the distal stomach. No free air is seen. No other focal abnormality is noted. IMPRESSION: Gastric catheter within the stomach. Electronically Signed   By: Oneil Devonshire M.D.   On: 08/30/2024 23:09   CT ABDOMEN PELVIS W CONTRAST Result Date: 08/30/2024 CLINICAL DATA:  Acute generalized abdominal pain, hematemesis. EXAM: CT ABDOMEN AND PELVIS WITH CONTRAST TECHNIQUE: Multidetector CT imaging of the abdomen and pelvis was  performed using the standard protocol following bolus administration of intravenous contrast. RADIATION DOSE REDUCTION: This exam was  performed according to the departmental dose-optimization program which includes automated exposure control, adjustment of the mA and/or kV according to patient size and/or use of iterative reconstruction technique. CONTRAST:  80mL OMNIPAQUE  IOHEXOL  300 MG/ML  SOLN COMPARISON:  August 29, 2023. FINDINGS: Lower chest: Stable nodule seen in lingular segment left upper lobe. Mild left lingular subsegmental atelectasis or infiltrate is noted. Hepatobiliary: No focal liver abnormality is seen. Status post cholecystectomy. No biliary dilatation. Pancreas: Unremarkable. No pancreatic ductal dilatation or surrounding inflammatory changes. Spleen: Normal in size without focal abnormality. Adrenals/Urinary Tract: Adrenal glands are unremarkable. Kidneys are normal, without renal calculi, focal lesion, or hydronephrosis. Bladder is unremarkable. Stomach/Bowel: Severe gastric distention is noted with moderate wall thickening of gastric antrum suggesting peptic ulcer disease. Severe inflammatory changes are seen involving the cecum suggesting infectious or inflammatory colitis. The appendix appears to be thickened and inflamed suggesting either appendicitis or inflammation secondary to cecal inflammation. Mildly dilated small bowel loops are noted which may represent ileus secondary to inflammation. Vascular/Lymphatic: Aortic atherosclerosis. Enlarged lymph nodes are noted in mesentery of right lower quadrant which most likely are inflammatory in etiology. Reproductive: Status post hysterectomy. No adnexal masses. Other: Mild ascites is noted in the abdomen and pelvis and around the liver and spleen. Nodular densities are noted anteriorly in the pelvis and involving the omentum concerning for peritoneal carcinomatosis. This includes 17 mm lymph node anteriorly on image number 48 of series 2.  Musculoskeletal: No acute or significant osseous findings. IMPRESSION: 1. Severe gastric distention is noted with moderate wall thickening of gastric antrum suggesting peptic ulcer disease. 2. Severe inflammatory changes are seen involving the cecum suggesting infectious or inflammatory colitis. The appendix appears to be thickened and inflamed suggesting either appendicitis or inflammation secondary to cecal inflammation. 3. Mild ascites is noted in the abdomen and pelvis and around the liver and spleen. 4. Nodular densities are noted anteriorly in the pelvis and involving the omentum concerning for peritoneal carcinomatosis. This includes 17 mm lymph node anteriorly. 5. Mildly dilated small bowel loops are noted which may represent ileus secondary to inflammation. 6. Stable nodule seen in lingular segment left upper lobe. Mild left lingular subsegmental atelectasis or infiltrate is noted. 7. Aortic atherosclerosis. Aortic Atherosclerosis (ICD10-I70.0). Electronically Signed   By: Lynwood Landy Raddle M.D.   On: 08/30/2024 18:03    Assessment and plan- Patient is a 78 y.o. female ***   Thank you for this kind referral and the opportunity to participate in the care of this  Patient   Visit Diagnosis 1. Colitis   2. Ileus (HCC)   3. Hematemesis with nausea     Dr. Annah Skene, MD, MPH [facility] [phone] 09/03/2024

## 2024-09-03 NOTE — Plan of Care (Signed)
 Patient continues to have 1:1 sitter for periods of verbal outbursts. Foley catheter continues to drain adequate amount of amber colored urine into drainage bag without difficulty.  No additional medical interventions required at this time.  Patient to continue to be monitored by hospital staff until discharge.

## 2024-09-03 NOTE — Progress Notes (Signed)
 [facility] SURGICAL PROGRESS NOTE (cpt [phone])  Hospital Day(s): 4.   Interval History: Patient seen and examined, no acute events or new complaints overnight. Patient remains pleasantly confused. Safety sitter at bedside. She has been complaining of abdominal pain requiring analgesics. No emesis. No fever. She remains without leukocytosis; WBC 5.9K. CMP is pending this AM. KUB this morning with air throughout the colon, no massive stomach dilation.   Review of Systems:  Unable to reliably perform secondary to altered mental status.   Vital signs in last 24 hours: [min-max] current  Temp:  [97.5 F (36.4 C)-98.2 F (36.8 C)] 97.8 F (36.6 C) (10/13 0731) Pulse Rate:  [86-96] 88 (10/13 0731) Resp:  [14-18] 18 (10/13 0731) BP: (107-159)/(43-81) 149/76 (10/13 0731) SpO2:  [93 %-96 %] 94 % (10/13 0731)     Height: 5' 1 (154.9 cm) Weight: 65 kg BMI (Calculated): 27.09   Intake/Output last 2 shifts:  10/12 0701 - 10/13 0700 In: 1200.1 [I.V.:711.3; IV Piggyback:488.9] Out: 800 [Urine:800]   Physical Exam:  Constitutional: alert, cooperative and no distress  HENT: normocephalic without obvious abnormality  Eyes: PERRL, EOM's grossly intact and symmetric  Respiratory: breathing non-labored at rest  Cardiovascular: regular rate and sinus rhythm  Gastrointestinal: soft, she is tender in lower and left abdomen, does not appear overtly distended, no rebound/guarding Musculoskeletal: safety mittens to upper extremity    Labs:     Latest Ref Rng & Units 09/02/2024   12:20 PM 08/31/2024    4:49 AM 08/30/2024    3:27 PM  CBC  WBC 4.0 - 10.5 K/uL 6.0  7.7  11.4   Hemoglobin 12.0 - 15.0 g/dL 86.8  86.5  83.8   Hematocrit 36.0 - 46.0 % 40.8  41.7  52.6   Platelets 150 - 400 K/uL 318  427  538       Latest Ref Rng & Units 09/02/2024    6:25 AM 09/01/2024    3:46 AM 08/31/2024    4:49 AM  CMP  Glucose 70 - 99 mg/dL  863  67   BUN 8 - 23 mg/dL  22  35   Creatinine  9.55 - 1.00 mg/dL  9.14  8.95   Sodium 864 - 145 mmol/L  143  140   Potassium 3.5 - 5.1 mmol/L 3.3  2.9  3.4   Chloride 98 - 111 mmol/L  107  107   CO2 22 - 32 mmol/L  27  24   Calcium 8.9 - 10.3 mg/dL  8.0  8.6   Total Protein 6.5 - 8.1 g/dL   6.2   Total Bilirubin 0.0 - 1.2 mg/dL   0.4   Alkaline Phos 38 - 126 U/L   100   AST 15 - 41 U/L   41   ALT 0 - 44 U/L   20      Imaging studies:   KUB (09/03/2024) personally reviewed with air throughout the colon, no massive stomach dilation, and radiologist report pending...   Assessment/Plan: 78 y.o. female found to have peritoneal carcinomatosis, etiology unclear   - Given findings on CT for peritoneal carcinomatosis without clear source, we will order US  paracentesis for cytology to try and establish etiology of this.   - Unfortunately, there is no great surgical option in this setting and carcinomatosis  - Would continue NPO for now  - Okay to hold off on replacement of NGT unless develops significant emesis; Unsure she will tolerate this with AMS  - Monitor  abdominal examination; on-going bowel function  - Further management per primary service; we will follow    All of the above findings and recommendations were discussed with the patient, and the medical team  -- Arthea Platt, PA-C [facility] 09/03/2024, 7:45 AM M-F: 7am - 4pm

## 2024-09-03 NOTE — Progress Notes (Signed)
  PROGRESS NOTE    Anita Mercer  FMW:[id number] DOB: 1946-05-15 DOA: 08/30/2024 PCP: Shara [name], MD  [id number]  LOS: 4 days   Brief hospital course:   Assessment & Plan: Anita Mercer is a 78 y.o. female with medical history significant of neuromuscular disorder, diabetes, GERD, hyperlipidemia, essential hypertension, tenosynovitis, who was brought to the ER by her husband with a complaint of abdominal pain, anorexia and distention.    #1 acute colitis:  --etiology unclear, possibly infectious.  started on zosyn and switched to ceftriaxone and flagyl --cont ceftriaxone and flagyl   #2 markedly distended stomach: Patient has had hematemesis on prior arrival.  Reported by her husband.   --GenSurg consulted.  NGT placed. --CT a/p with oral contrast neg for perforated ulcer.  Gastric distension resolved.  Pt started having stool. --pt pulled out NG tube overnight 10/12, will not re-insert. --diet pending SLP eval  #3 possible Peritoneal carcinomatosis:  --GenSurg rec palliative care consult for goals of care discussion, before workup --husband desired workup.   --consult oncology today.   --paracentesis today to collect for cytology  Hypoglycemia 2/2 NPO --BG q6h to monitor for hypoglycemia  #5 type 2 diabetes:  --A1c 6.7.  Will not order insulin .   #6 acute metabolic encephalopathy:  Baseline dementia --husband confirmed pt has baseline dementia, but mental status is worse than baseline.   #7 benign essential tremor:  Will resume home regimen once oral intake returns.   #8 hyperlipidemia:  Will resume home regimen when oral intake returns.  Hypokalemia --supplement with IV K   DVT prophylaxis: SCD/Compression stockings Code Status: Full code  Family Communication:  Level of care: Telemetry Medical Dispo:   The patient is from: home Anticipated d/c is to: to be determined Anticipated d/c date is: to be determined    Subjective and Interval  History:  Pt remained confused.     Objective: Vitals:   09/03/24 0355 09/03/24 0731 09/03/24 1543 09/03/24 1918  BP: (!) 159/81 (!) 149/76 (!) 144/81 (!) 147/74  Pulse: 96 88 (!) 101 98  Resp:  18 17   Temp: 98.2 F (36.8 C) 97.8 F (36.6 C) 97.6 F (36.4 C) 98 F (36.7 C)  TempSrc: Axillary  Axillary Axillary  SpO2: 96% 94% 96% 93%  Weight:      Height:        Intake/Output Summary (Last 24 hours) at 09/03/2024 2248 Last data filed at 09/03/2024 1800 Gross per 24 hour  Intake 440 ml  Output 1540 ml  Net -1100 ml   Filed Weights   08/30/24 1517  Weight: 65 kg    Examination:   Constitutional: NAD, alert, not oriented HEENT: conjunctivae and lids normal, EOMI CV: No cyanosis.   RESP: normal respiratory effort, on RA Neuro: II - XII grossly intact.    Foley present.   Data Reviewed: I have personally reviewed labs and imaging studies  Time spent: 35 minutes  Ellouise Haber, MD [facility] If 7PM-7AM, please contact night-coverage 09/03/2024, 10:48 PM

## 2024-09-03 NOTE — Progress Notes (Signed)
 Palliative Care Progress Note, Assessment & Plan   Patient Name: Anita Mercer       Date: 09/03/2024 DOB: [birth year]  Age: 78 y.o. MRN#: [id number] Attending Physician: Awanda City, MD Primary Care Physician: Shara [name], MD Admit Date: 08/30/2024  Subjective: Feels terrible today.  HPI: 78 y.o. female  with past medical history of diabetes, hypertension, hyperlipidemia, peripheral neuropathy, dementia followed by outpatient neurology, chronic abdominal pain followed by GI as outpatient admitted from home on 08/30/2024 with abdominal pain with associated distention, constipation, nausea and decreased appetite, has had 1 episode of emesis reportedly symptoms have been persisting for about 2 to 3 weeks.  Clinical picture remains unclear.   CT scan obtained revealing ascites in the right upper quadrant, distended stomach and inflammation Admitted and being treated for colitis with unclear etiology--massive dilation of the stomach with antral thickening, NG tube placed for decompression, antibiotic therapy initiated along with IV fluid resuscitation Repeat CT scan with contrast reveals concern for continued ascites and peritoneal carcinomatosis and thickening of ascending colon--General Surgery following, recommends goals of care conversations be had with high degree of concern for malignancy   Palliative medicine was consulted for assisting with goals of care conversations.  Summary of counseling/coordination of care: Extensive chart review completed prior to meeting patient including labs, vital signs, imaging, progress notes, orders, and available advanced directive documents from current and previous encounters.     Latest Ref Rng & Units 09/03/2024    7:57 AM 09/02/2024   12:20 PM 08/31/2024     4:49 AM  CBC  WBC 4.0 - 10.5 K/uL 5.9  6.0  7.7   Hemoglobin 12.0 - 15.0 g/dL 87.6  86.8  86.5   Hematocrit 36.0 - 46.0 % 39.0  40.8  41.7   Platelets 150 - 400 K/uL 300  318  427       Latest Ref Rng & Units 09/03/2024    5:53 AM 09/02/2024    6:25 AM 09/01/2024    3:46 AM  CMP  Glucose 70 - 99 mg/dL 823   863   BUN 8 - 23 mg/dL 17   22   Creatinine 9.55 - 1.00 mg/dL 9.06   9.14   Sodium 864 - 145 mmol/L 148   143   Potassium 3.5 - 5.1 mmol/L 4.1  3.3  2.9   Chloride 98 - 111 mmol/L 107   107   CO2 22 - 32 mmol/L 17   27   Calcium 8.9 - 10.3 mg/dL 8.5   8.0   Total Protein 6.5 - 8.1 g/dL 5.9     Total Bilirubin 0.0 - 1.2 mg/dL 0.5     Alkaline Phos 38 - 126 U/L 67     AST 15 - 41 U/L 19     ALT 0 - 44 U/L 14        After reviewing the patient's chart and assessing the patient at bedside, I spoke with patient's husband in regards to symptom management and goals of care.   Ill-appearing, elderly female lying in bed with sitter at bedside.  She is obviously uncomfortable and yells out God help me please multiple times during visit.  She  does answer question about how she is feeling today but does not acknowledge other questions.  Sitter at bedside noted to be comforting patient.  Husband advises patient has received pain medication but nurse is planning to give more medications soon.  Patient's respirations are even and unlabored.  She is in no distress.  IR PA at bedside to obtain consent for paracentesis.  Introduced myself as palliative care team nurse practitioner and colleague of Waddell Lesches, NP that he has been previously speaking with about goals of care.  He acknowledges previous conversations and shares that he has been thinking about their previous discussions.  Addressed with spouse the idea of functional status and concern that she may not return to her previous baseline prior to admission.  He shares that he just wants to get her back to her previous external status  from approximately 1 month ago.  He acknowledges that at that time she did sleep a lot but was still able to go out to eat and visit with friends occasionally.  His biggest concern at this time is controlling her pain.  Also discussed CODE STATUS and his wife is currently a full code meaning if her heart were to stop and she were to stop breathing she would receive CPR and ventilator for respiratory support.  Mr. Mctier acknowledges understanding and even shares that he and his wife signed DNR years ago but at this point he wants to try to do everything needed to help his wife to include short-term ventilator support.  Patient remains a full CODE STATUS.  Discussed findings on CT concerning for peritoneal carcinomatosis.  Patient's spouse acknowledges she will undergo paracentesis later today with plan for fluid to be analyzed for malignancy.  When initially speaking about possible malignancy, Mr. Lanter states he would want his wife to have treatment.  After further education and discussion in regards to difficulties of chemotherapy and overall decline in health he agrees that she would most likely not be a candidate.  He shares if his wife were to be diagnosed with cancer he would rather focus on her comfort with pain control and medicating her agitation/anxiety.  He continues to state numerous times during visit that he just wants to get his wife back to her baseline from 1 month ago.  Therapeutic silence and active listening provided for spouse to share his thoughts and emotions regarding current medical situation.  Emotional support provided.  Physical Exam Vitals reviewed.  Constitutional:      General: She is not in acute distress.    Appearance: She is ill-appearing.  HENT:     Head: Normocephalic and atraumatic.  Pulmonary:     Effort: Pulmonary effort is normal. No respiratory distress.  Abdominal:     General: There is distension.     Tenderness: There is no abdominal tenderness. There is  no guarding.  Musculoskeletal:     Right lower leg: No edema.     Left lower leg: No edema.  Neurological:     Mental Status: She is alert. She is disoriented.     Recommendations/Plan: FULL CODE status as previously documented    Continue current supportive interventions Follow-up for goals of care after body fluid cytology results PMT will continue to follow for support      Total Time 50 minutes   Discussed plan of care with Dr. Melanee, oncology, Dr. Awanda, admitting.  Time spent includes: Detailed review of medical records (labs, imaging, vital signs), medically appropriate exam (mental status,  respiratory, cardiac, skin), discussed with treatment team, counseling and educating patient, family and staff, documenting clinical information, medication management and coordination of care.     Devere Sacks, ELNITA- [facility] Palliative Medicine Team  09/03/2024 10:14 AM  Office [phone]  Pager [phone]

## 2024-09-03 NOTE — Progress Notes (Signed)
 Returned from paracentesis. Alert and responsive. Vital signs stable. No c/o pain or discomfort.  Sitter at bedside.

## 2024-09-03 NOTE — Care Management Important Message (Signed)
 Important Message  Patient Details  Name: Anita Mercer MRN: [id number] Date of Birth: 1946/10/24   Important Message Given:  Yes - Medicare IM     Venisa Frampton W, CMA 09/03/2024, 1:47 PM

## 2024-09-03 NOTE — Progress Notes (Signed)
 SLP Cancellation Note  Patient Details Name: Anita Mercer MRN: [id number] DOB: 1946/03/03   Cancelled treatment:       Reason Eval/Treat Not Completed: Medical issues which prohibited therapy (Per General Surgery note, Given findings on CT for peritoneal carcinomatosis without clear source, we will order US  paracentesis for cytology to try and establish etiology of this... Would continue NPO for now. Will defer clinical swallowing evaluation pending clearance from General Surgery.)  Anita Mercer, M.S., CCC-SLP Speech-Language Pathologist [facility] [phone] (ASCOM)  Anita Mercer 09/03/2024, 11:46 AM

## 2024-09-03 NOTE — Plan of Care (Signed)

## 2024-09-04 ENCOUNTER — Inpatient Hospital Stay

## 2024-09-04 DIAGNOSIS — Z7189 Other specified counseling: Secondary | ICD-10-CM | POA: Diagnosis not present

## 2024-09-04 DIAGNOSIS — K529 Noninfective gastroenteritis and colitis, unspecified: Secondary | ICD-10-CM | POA: Diagnosis not present

## 2024-09-04 LAB — CBC
HCT: 37.2 % (ref 36.0–46.0)
Hemoglobin: 11.5 g/dL — ABNORMAL LOW (ref 12.0–15.0)
MCH: 27.1 pg (ref 26.0–34.0)
MCHC: 30.9 g/dL (ref 30.0–36.0)
MCV: 87.7 fL (ref 80.0–100.0)
Platelets: 275 K/uL (ref 150–400)
RBC: 4.24 MIL/uL (ref 3.87–5.11)
RDW: 14.4 % (ref 11.5–15.5)
WBC: 5.7 K/uL (ref 4.0–10.5)
nRBC: 0 % (ref 0.0–0.2)

## 2024-09-04 LAB — SODIUM: Sodium: 149 mmol/L — ABNORMAL HIGH (ref 135–145)

## 2024-09-04 LAB — GLUCOSE, CAPILLARY
Glucose-Capillary: 174 mg/dL — ABNORMAL HIGH (ref 70–99)
Glucose-Capillary: 170 mg/dL — ABNORMAL HIGH (ref 70–99)
Glucose-Capillary: 213 mg/dL — ABNORMAL HIGH (ref 70–99)
Glucose-Capillary: 271 mg/dL — ABNORMAL HIGH (ref 70–99)

## 2024-09-04 LAB — MAGNESIUM: Magnesium: 1.9 mg/dL (ref 1.7–2.4)

## 2024-09-04 LAB — CEA: CEA: 43.8 ng/mL — ABNORMAL HIGH (ref 0.0–4.7)

## 2024-09-04 MED ORDER — INSULIN ASPART 100 UNIT/ML IJ SOLN
0.0000 [IU] | Freq: Every day | INTRAMUSCULAR | Status: DC
  Administered 2024-09-04: 3 [IU] via SUBCUTANEOUS
  Administered 2024-09-05 – 2024-09-06 (×2): 2 [IU] via SUBCUTANEOUS
  Filled 2024-09-04 (×3): qty 1

## 2024-09-04 MED ORDER — SODIUM CHLORIDE 0.45 % IV SOLN
INTRAVENOUS | Status: AC
Start: 2024-09-04 — End: 2024-09-05

## 2024-09-04 MED ORDER — INSULIN ASPART 100 UNIT/ML IJ SOLN
0.0000 [IU] | Freq: Three times a day (TID) | INTRAMUSCULAR | Status: DC
  Administered 2024-09-05: 3 [IU] via SUBCUTANEOUS
  Administered 2024-09-05: 5 [IU] via SUBCUTANEOUS
  Administered 2024-09-05 – 2024-09-06 (×4): 8 [IU] via SUBCUTANEOUS
  Administered 2024-09-07: 5 [IU] via SUBCUTANEOUS
  Filled 2024-09-04 (×7): qty 1

## 2024-09-04 NOTE — Progress Notes (Signed)
  IR BRIEF PROGRESS NOTE:  IR has received the request for omental biopsy. IR attending, Dr. Luverne, has reviewed the request, but deferred it for now. Per review of imaging, the carcinomatosis concern on this patient is low, and the abdominal target is small. Patient underwent a paracentesis yesterday, with 100 cc removed for laboratory workup, to include cytology. Dr. Luverne recommends awaiting cytology results before approving the request for omental biopsy. Should the cytology labs result as inconclusive, or there be a need for additional tissue for proper diagnosis, the biopsy can be considered at that time. Please re-consult IR with new biopsy request. IR remains available.   Electronically Signed: Carlin DELENA Griffon, PA-C 09/04/2024, 9:33 AM

## 2024-09-04 NOTE — Progress Notes (Addendum)
 SLP Cancellation Note  Patient Details Name: BRENLYN BESHARA MRN: [id number] DOB: 1946-05-11   Cancelled treatment:       Reason Eval/Treat Not Completed: Patient's level of consciousness (Cleared with Dr. Awanda @ [phone] for swallow evaluation. Pt unable to rouse for safe POs. Per sitter, pt given morphine this AM due to pain. Will continue efforts as appropriate.)  Addendum @ [phone]: Received Secure Chat from Dr. Awanda. Pt NPO for procedure. MD requesting SLP to hold swallowing evaluation this date.   Delon Bangs, M.S., CCC-SLP Speech-Language Pathologist [facility] [phone] (ASCOM)  [name] 09/04/2024, 8:22 AM

## 2024-09-04 NOTE — Progress Notes (Addendum)
 Daily Progress Note   Patient Name: Anita Mercer       Date: 09/04/2024 DOB: 08-25-1946  Age: 78 y.o. MRN#: [id number] Attending Physician: Awanda City, MD Primary Care Physician: Shara [name], MD Admit Date: 08/30/2024  Reason for Consultation/Follow-up: Establishing goals of care  Subjective: Patient currently being followed by PMT for assistance with goals of care.  Notes and labs reviewed.  Epic chat with attending, oncology, and surgery service ongoing.   Awaiting cytology from yesterday's paracentesis to result.  Depending on the results, potential for IR biopsy.  In to see patient, and family if they are present, and answer any questions they may have. Patient has TeleSitter in place, and mittens.  No family at bedside.  She is able to tell me her name.  She is confused and states she has been widowed and is not currently married.  She states she lives at home with someone but is unsure of who she lives with.  She complains of abdominal pain.  No grimacing or verbalized increased pain with palpation.  Attempted to call patient's husband unsuccessfully.  Call did not ring and immediately forwarded to voicemail.   Length of Stay: 5  Current Medications: Scheduled Meds:   Chlorhexidine Gluconate Cloth  6 each Topical Daily    Continuous Infusions:  sodium chloride  75 mL/hr at 09/04/24 1134   cefTRIAXone (ROCEPHIN)  IV 2 g (09/04/24 0315)   metronidazole 500 mg (09/04/24 1513)    PRN Meds: diazepam, morphine injection, ondansetron **OR** ondansetron (ZOFRAN) IV  Physical Exam Pulmonary:     Effort: Pulmonary effort is normal.  Abdominal:     Comments: Abdomen is round  Neurological:     Mental Status: She is alert.             Vital Signs: BP (!) 135/59 (BP  Location: Right Arm)   Pulse 91   Temp 98.5 F (36.9 C) (Oral)   Resp 17   Ht 5' 1 (1.549 m)   Wt 65 kg   SpO2 (!) 88%   BMI 27.08 kg/m  SpO2: SpO2: (!) 88 % O2 Device: O2 Device: Room Air O2 Flow Rate:    Intake/output summary:  Intake/Output Summary (Last 24 hours) at 09/04/2024 1535 Last data filed at 09/04/2024 0505 Gross per 24 hour  Intake 2110.53  ml  Output 600 ml  Net 1510.53 ml   LBM: Last BM Date : 09/04/24 Baseline Weight: Weight: 65 kg Most recent weight: Weight: 65 kg       Patient Active Problem List   Diagnosis Date Noted   Colitis 08/30/2024   Delirium due to multiple etiologies, acute, hyperactive 08/30/2024   Suspected Peritoneal carcinomatosis (HCC) 08/30/2024   B12 deficiency 07/12/2016   Gastroesophageal reflux disease with esophagitis 07/12/2016   Osteopenia determined by x-ray 07/09/2016   De Quervain's tenosynovitis, right 06/17/2016   Chronic fatigue 06/16/2016   Hearing loss 05/28/2014   Benign essential tremor 05/10/2013   Allergic rhinitis 03/15/2013   Neuropathic pain 09/11/2012   Paroxysmal nerve pain 09/11/2012   Hyperlipidemia associated with type 2 diabetes mellitus (HCC) 10/01/2011   Essential hypertension 10/01/2011   Type 2 diabetes mellitus with diabetic polyneuropathy, with long-term current use of insulin  (HCC) 10/01/2011    Palliative Care Assessment & Plan   Recommendations/Plan: Continue current care Waiting for cytology results from paracentesis yesterday.  Code Status:    Code Status Orders  (From admission, onward)           Start     Ordered   08/30/24 2028  Full code  Continuous       Question:  By:  Answer:  Consent: discussion documented in EHR   08/30/24 2028           Code Status History     This patient has a current code status but no historical code status.       Camelia Lewis, NP  Please contact Palliative Medicine Team phone at [phone] for questions and concerns.

## 2024-09-04 NOTE — Progress Notes (Signed)
  [facility]ESS [facility] (cpt [phone])  Hospital Day(s): 5.   Interval History: Patient seen and examined, no acute events or new complaints overnight. Patient remains pleasantly confused. She continues to remain without leukocytosis; WBC 5.7K. Cancer markers are still pending. Underwent US  paracentesis and cytology pending. She has CT Chest and CT Biopsy pending.   Review of Systems:  Unable to reliably perform secondary to altered mental status.   Vital signs in last 24 hours: [min-max] current  Temp:  [97.6 F (36.4 C)-98 F (36.7 C)] 97.8 F (36.6 C) (10/14 0413) Pulse Rate:  [94-101] 94 (10/14 0413) Resp:  [17] 17 (10/13 1543) BP: (144-150)/(73-81) 150/73 (10/14 0413) SpO2:  [88 %-96 %] 88 % (10/14 0413)     Height: 5' 1 (154.9 cm) Weight: 65 kg BMI (Calculated): 27.09   Intake/Output last 2 shifts:  10/13 0701 - 10/14 0700 In: 2350.5 [P.O.:240; I.V.:1465.5; IV Piggyback:645] Out: 1490 [Urine:1490]   Physical Exam:  Constitutional: alert, cooperative and no distress  HENT: normocephalic without obvious abnormality  Eyes: PERRL, EOM's grossly intact and symmetric  Respiratory: breathing non-labored at rest  Cardiovascular: regular rate and sinus rhythm  Gastrointestinal: soft, she is tender in lower and left abdomen, does not appear overtly distended, no rebound/guarding Musculoskeletal: safety mittens to upper extremity    Labs:     Latest Ref Rng & Units 09/04/2024    6:04 AM 09/03/2024    7:57 AM 09/02/2024   12:20 PM  CBC  WBC 4.0 - 10.5 K/uL 5.7  5.9  6.0   Hemoglobin 12.0 - 15.0 g/dL 88.4  87.6  86.8   Hematocrit 36.0 - 46.0 % 37.2  39.0  40.8   Platelets 150 - 400 K/uL 275  300  318       Latest Ref Rng & Units 09/04/2024    6:04 AM 09/03/2024    5:53 AM 09/02/2024    6:25 AM  CMP  Glucose 70 - 99 mg/dL  823    BUN 8 - 23 mg/dL  17    Creatinine 9.55 - 1.00 mg/dL  9.06    Sodium 864 - 854 mmol/L 149  148    Potassium 3.5 -  5.1 mmol/L  4.1  3.3   Chloride 98 - 111 mmol/L  107    CO2 22 - 32 mmol/L  17    Calcium 8.9 - 10.3 mg/dL  8.5    Total Protein 6.5 - 8.1 g/dL  5.9    Total Bilirubin 0.0 - 1.2 mg/dL  0.5    Alkaline Phos 38 - 126 U/L  67    AST 15 - 41 U/L  19    ALT 0 - 44 U/L  14       Imaging studies:  No new imaging studies   Assessment/Plan: 78 y.o. female found to have peritoneal carcinomatosis, etiology unclear   - Appreciate oncology assistance; CT Chest and CT Biopsy pending  - Likely needs to be NPO for CT Biopsy; Okay for SLP evaluation from surgical perspective otherwise   - Unfortunately, there is no great surgical option in this setting and carcinomatosis; appreciate palliative care assistance  - Okay to hold off on replacement of NGT unless develops significant emesis; Unsure she will tolerate this with AMS  - Monitor abdominal examination; on-going bowel function  - Further management per primary service   - Nothing to offer from surgical perspective at this time. She would not be an operative candidate in the  setting of carcinomatosis, advanced age, deconditioning. We will remain available as needed; Please call with questions./concerns   All of the above findings and recommendations were discussed with the patient, and the medical team  -- Arthea Platt, PA-C [facility] 09/04/2024, 9:22 AM M-F: 7am - 4pm

## 2024-09-04 NOTE — Evaluation (Signed)
 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)    Sinusitis    Tenosynovitis    Past Surgical History:  Past Surgical History:  Procedure Laterality Date   ABDOMINAL HYSTERECTOMY     ACHILLES TENDON SURGERY  2006   removela of bone spur   BUNIONECTOMY     CATARACT EXTRACTION Bilateral 10/18/11  04/04/13   CHOLECYSTECTOMY     COLONOSCOPY  01/13/2011   COLONOSCOPY WITH PROPOFOL  N/A 08/11/2015   Procedure: COLONOSCOPY WITH PROPOFOL ;  Surgeon: Gladis RAYMOND Mariner, MD;  Location: [facility] ENDOSCOPY;  Service: Endoscopy;  Laterality: N/A;   echo  10/23/2012   ESOPHAGOGASTRODUODENOSCOPY (EGD) WITH PROPOFOL  N/A 08/11/2015   Procedure: ESOPHAGOGASTRODUODENOSCOPY (EGD) WITH PROPOFOL ;  Surgeon: Gladis RAYMOND Mariner, MD;  Location: [facility] ENDOSCOPY;  Service: Endoscopy;  Laterality: N/A;   FOOT SURGERY     Dr. Magdalen, bone spurs   GALLBLADDER SURGERY     skin tags     UPPER GASTROINTESTINAL ENDOSCOPY  08/08/2015   with MAC- barretts and duodenitis   HPI:  Anita Mercer is a 78 y.o. female with medical history significant of neuromuscular disorder, diabetes, GERD, hyperlipidemia, essential hypertension, tenosynovitis, who was brought to the ER by her husband with a complaint of abdominal pain, anorexia and distention. Work up thus far notable for acute colitis and possible peritoneal carcinomatosis.    Assessment / Plan / Recommendation  Clinical Impression  Pt seen for clincial swallowing  evaluation. Pt alert, confused, and distractible. Husband at bedside. Dr. Awanda raker SLP directly and cleared pt for ST evaluation. Assessment limited by pt's AMS. Pt with s/sx concerning for pharyngeal dysphagia given immediate cough on trials of thin by straw. No s/sx with cup sips. Orally, pt with oral holding and expectoration of solid. Again, oral deficits likley exacerbated by AMS. Oral phase appeared [facility] for puree and thin liquid trials. Recommend initiation of a puree diet with thin liquids - NO straws - standard aspiration precuations, assistance with feeding, and use of carrier for meds (whole vs crushed in puree). SLP to f/u per POC for diet tolerance and trials of upgraded solids as appropriate.  SLP Visit Diagnosis: Dysphagia, oropharyngeal phase (R13.12)    Aspiration Risk  Mild aspiration risk    Diet Recommendation Dysphagia 1 (Puree);Thin liquid    Liquid Administration via: Spoon;Cup;No straw Medication Administration: Whole meds with puree (vs crushed) Supervision: Staff to assist with self feeding;Full supervision/cueing for compensatory strategies Compensations: Minimize environmental distractions;Slow rate;Small sips/bites Postural Changes: Seated upright at 90 degrees;Remain upright for at least 30 minutes after po intake    Other  Recommendations Oral Care Recommendations: Oral care QID;Staff/trained caregiver to provide oral care        Functional Status Assessment Patient has had a recent decline in their functional status and demonstrates the ability to make significant improvements in function in a reasonable and predictable amount of time.  Frequency and Duration min 2x/week  2 weeks  Prognosis Prognosis for improved oropharyngeal function: Fair Barriers to Reach Goals: Cognitive deficits      Swallow Study   General Date of Onset: 08/30/24 HPI: Anita Mercer is a 78 y.o. female with medical history significant of neuromuscular disorder, diabetes,  GERD, hyperlipidemia, essential hypertension, tenosynovitis, who was brought to the ER by her husband with a complaint of abdominal pain, anorexia and distention. Work up thus far notable for acute colitis and possible peritoneal carcinomatosis. Type of Study: Bedside Swallow Evaluation Previous Swallow Assessment: none Diet Prior to this Study: NPO Temperature Spikes Noted: No (WBC WNL) Respiratory Status: Room air History of Recent Intubation: No Behavior/Cognition: Alert;Cooperative;Pleasant mood;Confused;Impulsive;Distractible Oral Cavity Assessment: Within Functional Limits Oral Care Completed by SLP: Recent completion by staff Oral Cavity - Dentition:  (unable to fully assess due to pt's AMS) Vision: Functional for self-feeding Self-Feeding Abilities: Total assist Patient Positioning: Upright in bed Baseline Vocal Quality: Normal Volitional Cough: Cognitively unable to elicit Volitional Swallow: Unable to elicit    Oral/Motor/Sensory Function Overall Oral Motor/Sensory Function:  (no obvious deficits)   Ice Chips Ice chips: Not tested   Thin Liquid Thin Liquid: Impaired Presentation: Cup;Straw Pharyngeal  Phase Impairments: Cough - Immediate (with straw sip of thin x1/10)    Nectar Thick Nectar Thick Liquid: Not tested   Honey Thick Honey Thick Liquid: Not tested   Puree Puree: Within functional limits Presentation: Spoon   Solid     Solid: Impaired (oral holding with eventual expectoration)     Delon Bangs, M.S., CCC-SLP Speech-Language Pathologist [facility] [phone] (ASCOM)  Delon HERO Bernadetta Roell 09/04/2024,3:44 PM

## 2024-09-04 NOTE — Plan of Care (Signed)

## 2024-09-04 NOTE — Progress Notes (Addendum)
  PROGRESS NOTE    Anita Mercer  FMW:[id number] DOB: 1946-07-01 DOA: 08/30/2024 PCP: Shara [name], MD  [id number]  LOS: 5 days   Brief hospital course:   Assessment & Plan: Anita Mercer is a 78 y.o. female with medical history significant of neuromuscular disorder, diabetes, GERD, hyperlipidemia, essential hypertension, tenosynovitis, who was brought to the ER by her husband with a complaint of abdominal pain, anorexia and distention.    #1 acute colitis:  --etiology unclear, possibly infectious.  started on zosyn and switched to ceftriaxone and flagyl --cont ceftriaxone and flagyl  (today day 5)   #2 markedly distended stomach: Patient has had hematemesis on prior arrival.  Reported by her husband.   --GenSurg consulted.  NGT placed. --CT a/p with oral contrast neg for perforated ulcer.  Gastric distension resolved.  Pt started having stool. --pt pulled out NG tube overnight 10/12, will not re-insert. --SLP approved dys 1 diet with thin liquid today  #3 possible Peritoneal carcinomatosis:  --GenSurg rec palliative care consult for goals of care discussion, before workup --husband desired workup.   --consulted oncology with Dr. Melanee --paracentesis on 10/13 to collect for cytology --IR to consider biopsy pending cytology results  DM2 Hypoglycemia 2/2 NPO --BG trending up now that pt is having oral intake --start ACHS and SSI today --consider resuming home Lantus  tomorrow based on BG readings  Acute urinary retention --Foley inserted on 08/31/24 --consider voiding trial in about a week  #6 acute metabolic encephalopathy:  Baseline dementia --husband confirmed pt has baseline dementia, but mental status was worse than baseline PTA.   #7 benign essential tremor:    #8 hyperlipidemia:  --resume statin after discharge  Hypokalemia --supplement PRN  Hypernatremia --likely due free water deficit from prolonged NPO --cont 1/2 NS infusion --monitor Na  AKI,  mild --improved with MIVF   DVT prophylaxis: SCD/Compression stockings Code Status: Full code  Family Communication:  Level of care: Telemetry Medical Dispo:   The patient is from: home Anticipated d/c is to: to be determined pending PT eval Anticipated d/c date is: when disposition determined   Subjective and Interval History:  Pt had SLP eval today, approved for dys 1 diet and thin fluids.     Objective: Vitals:   09/04/24 0413 09/04/24 0900 09/04/24 1827 09/04/24 2006  BP: (!) 150/73 (!) 135/59 (!) 165/86 (!) 144/82  Pulse: 94 91  (!) 109  Resp:   18 16  Temp: 97.8 F (36.6 C) 98.5 F (36.9 C) 98.8 F (37.1 C) 98.6 F (37 C)  TempSrc: Oral Oral  Oral  SpO2: (!) 88% (!) 88% 90% 92%  Weight:      Height:        Intake/Output Summary (Last 24 hours) at 09/04/2024 2016 Last data filed at 09/04/2024 1900 Gross per 24 hour  Intake 2584.05 ml  Output 300 ml  Net 2284.05 ml   Filed Weights   08/30/24 1517  Weight: 65 kg    Examination:   Constitutional: NAD, alert, not oriented HEENT: conjunctivae and lids normal, EOMI CV: No cyanosis.   RESP: normal respiratory effort, on RA Neuro: II - XII grossly intact.    Foley present.   Data Reviewed: I have personally reviewed labs and imaging studies  Time spent: 50 minutes  Ellouise Haber, MD [facility] If 7PM-7AM, please contact night-coverage 09/04/2024, 8:16 PM

## 2024-09-05 DIAGNOSIS — K529 Noninfective gastroenteritis and colitis, unspecified: Secondary | ICD-10-CM | POA: Diagnosis not present

## 2024-09-05 LAB — GLUCOSE, CAPILLARY
Glucose-Capillary: 184 mg/dL — ABNORMAL HIGH (ref 70–99)
Glucose-Capillary: 253 mg/dL — ABNORMAL HIGH (ref 70–99)
Glucose-Capillary: 230 mg/dL — ABNORMAL HIGH (ref 70–99)
Glucose-Capillary: 239 mg/dL — ABNORMAL HIGH (ref 70–99)

## 2024-09-05 LAB — CA 125: Cancer Antigen (CA) 125: 313 U/mL — ABNORMAL HIGH (ref 0.0–38.1)

## 2024-09-05 LAB — CYTOLOGY - NON PAP

## 2024-09-05 LAB — CA 19-9 (SERIAL): CA 19-9: 372 U/mL — ABNORMAL HIGH (ref 0–35)

## 2024-09-05 LAB — SODIUM: Sodium: 145 mmol/L (ref 135–145)

## 2024-09-05 MED ORDER — HEPARIN SODIUM (PORCINE) 5000 UNIT/ML IJ SOLN
5000.0000 [IU] | Freq: Two times a day (BID) | INTRAMUSCULAR | Status: DC
  Administered 2024-09-05 – 2024-09-07 (×4): 5000 [IU] via SUBCUTANEOUS
  Filled 2024-09-05 (×4): qty 1

## 2024-09-05 MED ORDER — HYDROMORPHONE HCL 1 MG/ML IJ SOLN
1.0000 mg | Freq: Once | INTRAMUSCULAR | Status: AC
  Administered 2024-09-05: 1 mg via INTRAVENOUS
  Filled 2024-09-05: qty 1

## 2024-09-05 NOTE — Evaluation (Signed)
 Physical Therapy Evaluation Patient Details Name: Anita Mercer MRN: [id number] DOB: [birth year] Today's Date: 09/05/2024  History of Present Illness  Pt is a 78 y/o F admitted on 08/30/24 after presenting with c/o abdominal pain, anorexia & distention. Pt is being treated for acute colitis. PMH: neuromuscular disorder, DM, GERD, HLD, HTN, tenosynovitis  Clinical Impression  Pt seen for PT evaluation with pt received in bed, spouse present. Spouse reports prior to admission pt was ambulatory without AD, going to the bathroom on her own, negotiating stairs in their home without assistance. During session pt verbally communicated with PT but would not open eyes, poor ability to follow commands. Pt requires max assist for all bed mobility tasks. Attempted to assist pt to EOB & clean face with cold wet cloth to increase alertness but pt only tolerated sitting EOB <2 minutes before crying, requesting to lie back down 2/2 increased back pain. Pt noted to be soiled with incontinent BM, rolled L<>R with max assist for peri hygiene & linen change. Pt crying/moaning throughout all mobility attempts, reassured by PT & nurse throughout session. Recommend ongoing PT services to progress mobility as able. Hopeful pt will mobilize better once alertness, cognition improves.       If plan is discharge home, recommend the following: Two people to help with bathing/dressing/bathroom;Two people to help with walking and/or transfers;Assistance with feeding;Assistance with cooking/housework;Direct supervision/assist for financial management;Direct supervision/assist for medications management;Help with stairs or ramp for entrance;Supervision due to cognitive status;Assist for transportation   Can travel by private vehicle   No    Equipment Recommendations Wheelchair (measurements PT);Hoyer lift;Hospital bed;Wheelchair cushion (measurements PT)  Recommendations for Other Services  Rehab consult    Functional Status  Assessment Patient has had a recent decline in their functional status and demonstrates the ability to make significant improvements in function in a reasonable and predictable amount of time.     Precautions / Restrictions Precautions Precautions: Fall Restrictions Weight Bearing Restrictions Per Provider Order: No      Mobility  Bed Mobility Overal bed mobility: Needs Assistance Bed Mobility: Rolling, Supine to Sit, Sit to Supine Rolling: Max assist   Supine to sit: Max assist, HOB elevated (exited R side of bed, assistance to move BLE off EOB & upright trunk) Sit to supine: Max assist        Transfers                        Ambulation/Gait                  Stairs            Wheelchair Mobility     Tilt Bed    Modified Rankin (Stroke Patients Only)       Balance Overall balance assessment: Needs assistance Sitting-balance support: Feet supported Sitting balance-Leahy Scale: Poor   Postural control: Right lateral lean                                   Pertinent Vitals/Pain Pain Assessment Pain Assessment: PAINAD Faces Pain Scale: Hurts whole lot Breathing: normal Negative Vocalization: occasional moan/groan, low speech, negative/disapproving quality Facial Expression: facial grimacing Body Language: tense, distressed pacing, fidgeting Consolability: distracted or reassured by voice/touch PAINAD Score: 5 Pain Location: low back (husband endorses hx of arthritis in back)    Home Living Family/patient expects to be discharged to:: Private residence Living  Arrangements: Spouse/significant other Available Help at Discharge: Family Type of Home: House Home Access: Stairs to enter   [employer] of Steps: 1 onto front porch + 1 into door way Alternate Level Stairs-Number of Steps: flight Home Layout: Two level;Able to live on main level with bedroom/bathroom (pt's bedroom & bathroom were upstairs (closer in  proximitiy 2/2 incontinence) but pt does have bedroom/bathroom on main level that she can use) Home Equipment: Shower seat;Grab bars - tub/shower;Rollator (4 wheels);Rolling Walker (2 wheels) (walking stick)      Prior Function               Mobility Comments: PRN use of walking stick, 1 fall in the past 6 months (Rolled out of bed), does not drive, ambulatory around the house, would still ambulate in the community at least 1x/week to go out to eat with spouse & friends ADLs Comments: spouse assisted PRN (washing back), dressed without assistance, used depends 2/2 urgency urinary incontinence     Extremity/Trunk Assessment   Upper Extremity Assessment Upper Extremity Assessment: Generalized weakness;Difficult to assess due to impaired cognition    Lower Extremity Assessment Lower Extremity Assessment: Generalized weakness;Difficult to assess due to impaired cognition       Communication   Communication Communication: Impaired Factors Affecting Communication: Difficulty expressing self    Cognition Arousal: Lethargic Behavior During Therapy: Anxious   PT - Cognitive impairments: History of cognitive impairments, Difficult to assess                       PT - Cognition Comments: Pt with hx of dementia per chart, pt with decreased ability to follow commands during session Following commands: Impaired Following commands impaired: Follows one step commands inconsistently     Cueing Cueing Techniques: Verbal cues, Gestural cues, Tactile cues     General Comments      Exercises Other Exercises Other Exercises: Attempted to engage pt in washing face.   Assessment/Plan    PT Assessment Patient needs continued PT services  PT Problem List Decreased strength;Pain;Decreased range of motion;Decreased cognition;Decreased knowledge of use of DME;Decreased activity tolerance;Decreased safety awareness;Decreased balance;Decreased mobility       PT Treatment  Interventions DME instruction;Balance training;Neuromuscular re-education;Gait training;Stair training;Functional mobility training;Wheelchair mobility training;Therapeutic activities;Therapeutic exercise;Cognitive remediation;Patient/family education    PT Goals (Current goals can be found in the Care Plan section)  Acute Rehab PT Goals Patient Stated Goal: get better PT Goal Formulation: With family Time For Goal Achievement: 09/19/24 Potential to Achieve Goals: Fair    Frequency Min 2X/week     Co-evaluation               AM-PAC PT 6 Clicks Mobility  Outcome Measure Help needed turning from your back to your side while in a flat bed without using bedrails?: Total Help needed moving from lying on your back to sitting on the side of a flat bed without using bedrails?: Total Help needed moving to and from a bed to a chair (including a wheelchair)?: Total Help needed standing up from a chair using your arms (e.g., wheelchair or bedside chair)?: Total Help needed to walk in hospital room?: Total Help needed climbing 3-5 steps with a railing? : Total 6 Click Score: 6    End of Session   Activity Tolerance: Patient limited by pain;Patient limited by fatigue Patient left: in bed;with call bell/phone within reach;with bed alarm set;with nursing/sitter in room;with family/visitor present Nurse Communication: Mobility status PT Visit Diagnosis: Other  abnormalities of gait and mobility (R26.89);Muscle weakness (generalized) (M62.81);Difficulty in walking, not elsewhere classified (R26.2);Pain Pain - part of body:  (back)    Time: 8957-8892 PT Time Calculation (min) (ACUTE ONLY): 25 min   Charges:   PT Evaluation $PT Eval Moderate Complexity: 1 Mod   PT General Charges $$ ACUTE PT VISIT: 1 Visit         Richerd Pinal, PT, DPT 09/05/24, 12:05 PM   Richerd CHRISTELLA Pinal 09/05/2024, 11:59 AM

## 2024-09-05 NOTE — Progress Notes (Signed)
 PROGRESS NOTE    Anita Mercer  FMW:[id number] DOB: 08/15/1946 DOA: 08/30/2024 PCP: Shara [name], MD  Chief Complaint  Patient presents with   Emesis   Abdominal Pain    Hospital Course:  Anita Mercer is a 78 year old female with neuromuscular disorder, diabetes, GERD, hypertension, tenosynovitis, dementia, who was brought to the ER with complaint of abdominal pain, distention, anorexia.  She was initially treated for infectious colitis, and possible ileus with NG tube.  NG tube was removed on 10/12 and patient has been doing well without.  CT reveals concern for peritoneal carcinomatosis.  Oncology was consulted.  Interventional radiology was consulted to perform paracentesis.  Cytology concerning for adenocarcinoma.  Subjective: No acute events overnight.  Patient is resting easily in bed today.  Her husband reports that she is getting good rest at this time.  We discussed overall prognosis and plans.  Patient's husband would like to speak directly with oncology regarding biopsy results   Objective: Vitals:   09/04/24 2006 09/05/24 0423 09/05/24 0812 09/05/24 1613  BP: (!) 144/82 (!) 148/73 (!) 151/79 139/79  Pulse: (!) 109 98 (!) 101 (!) 101  Resp: 16 20 16 16   Temp: 98.6 F (37 C) 98.2 F (36.8 C) 98 F (36.7 C) 99.4 F (37.4 C)  TempSrc: Oral     SpO2: 92%  (!) 89% 93%  Weight:      Height:        Intake/Output Summary (Last 24 hours) at 09/05/2024 1754 Last data filed at 09/04/2024 1900 Gross per 24 hour  Intake 180 ml  Output --  Net 180 ml   Filed Weights   08/30/24 1517  Weight: 65 kg    Examination: General exam: Appears calm and comfortable, NAD  Respiratory system: No work of breathing, symmetric chest wall expansion Cardiovascular system: S1 & S2 heard, RRR.  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 - General Surgery consulted, distention resolving. - Patient is now having bowel movements - Continue monitor closely - Continue with on liquid diet  Peritoneal carcinomatosis - Oncology consulted - Interventional radiology consulted.  Paracentesis 10/13 for cytology. 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  Titrate insulin  needs daily  Acute urinary retention - Foley catheter inserted 10/10.  Will maintain for now. - Consider voiding trial if patient becomes more alert and ambulatory  Acute metabolic encephalopathy Dementia - At baseline patient is in bed 79% of the day but does walk and eat independently.  Husband endorses good quality of life 1 month ago prior to arrival  Essential tremor Hyperlipidemia - Resume home meds at DC  Hypokalemia - Supplement as needed  Hypernatremia - Due to free  water deficit from prolonged n.p.o. - Improving now  AKI, - Improved  DVT prophylaxis: Heparin   Code Status: Full Code Disposition: Pending final goals of care discussions.  Likely hospice at DC   Consultants:  Treatment Team:  Consulting Physician: Melanee Annah BROCKS, MD  Procedures:     Antimicrobials:  Anti-infectives (From admission, onward)    Start     Dose/Rate Route Frequency Ordered Stop   08/31/24 0300  cefTRIAXone (ROCEPHIN) 2 g in sodium chloride  0.9 % 100 mL IVPB        2 g 200 mL/hr over 30 Minutes Intravenous Every 24 hours 08/30/24 2028     08/31/24 0300  metroNIDAZOLE (FLAGYL) IVPB 500 mg        500 mg 100 mL/hr over 60 Minutes Intravenous Every 12 hours 08/30/24 2028     08/30/24 1845  piperacillin-tazobactam (ZOSYN) IVPB 3.375 g        3.375 g 100 mL/hr over 30 Minutes Intravenous  Once 08/30/24 1837 08/30/24 1954       Data Reviewed: I have personally reviewed following labs and imaging studies CBC: Recent Labs  Lab 08/30/24 1527 08/31/24 0449 09/02/24 1220 09/03/24 0757 09/04/24 0604  WBC 11.4* 7.7 6.0 5.9 5.7  NEUTROABS 9.9*  --  4.9  --   --   HGB 16.1* 13.4 13.1 12.3 11.5*  HCT 52.6* 41.7 40.8 39.0 37.2  MCV 87.2 85.5 87.4 86.7 87.7  PLT 538* 427* 318 300 275   Basic Metabolic Panel: Recent Labs  Lab 08/30/24 1527 08/31/24 0449 09/01/24 0346 09/02/24 0625 09/03/24 0553 09/04/24 0604 09/05/24 0511  NA 140 140 143  --  148* 149* 145  K 4.4 3.4* 2.9* 3.3* 4.1  --   --   CL 98 107 107  --  107  --   --   CO2 26 24 27   --  17*  --   --   GLUCOSE 94 67* 136*  --  176*  --   --   BUN 35* 35* 22  --  17  --   --   CREATININE 1.11* 1.04* 0.85  --  0.93  --   --   CALCIUM 9.3 8.6* 8.0*  --  8.5*  --   --   MG  --   --  1.7  --  1.8 1.9  --   PHOS  --   --   --  2.5  --   --   --    GFR: Estimated Creatinine Clearance: 42.4 mL/min (by C-G formula based on SCr of 0.93 mg/dL). Liver Function Tests: Recent Labs  Lab 08/30/24 1527 08/31/24 0449 09/03/24 0553  AST 41 41 19  ALT 14 20 14   ALKPHOS 67 100 67  BILITOT 1.1 0.4 0.5  PROT 7.8 6.2* 5.9*  ALBUMIN 3.7 3.0* 2.4*   CBG: Recent Labs  Lab 09/04/24 1333 09/04/24 1829 09/04/24 2126 09/05/24 0815 09/05/24 1206  GLUCAP 170* 213* 271* 184* 253*    Recent  Results (from the past 240 hours)  C Difficile Quick Screen w PCR reflex     Status: None   Collection Time: 09/01/24  6:40 AM   Specimen: STOOL  Result Value Ref Range Status   C Diff antigen NEGATIVE NEGATIVE Final   C Diff toxin NEGATIVE NEGATIVE Final   C Diff interpretation No C. difficile detected.  Final    Comment: Performed at [facility], [address] [place], [place] [address]  Aerobic/Anaerobic Culture w Gram Stain (surgical/deep wound)     Status: None (Preliminary result)   Collection Time: 09/03/24  3:05 PM   Specimen: Abdomen; Peritoneal Fluid  Result Value Ref Range Status   Specimen Description   Final    ABDOMEN Performed at [facility], [address], [place], [place] [address]    Special Requests   Final    NONE Performed at [facility], [address] [place], [place] [address]    Gram Stain NO WBC SEEN RARE GRAM NEGATIVE RODS   Final   Culture   Final    NO GROWTH 1 DAY Performed at [facility], [address] [place], [place] [address]    Report Status PENDING  Incomplete     Radiology Studies: CT CHEST WO CONTRAST Result Date: 09/04/2024 CLINICAL DATA:  Evaluation of occult malignancy EXAM: CT CHEST WITHOUT CONTRAST TECHNIQUE: Multidetector CT imaging of the chest was performed following the standard protocol without IV contrast. RADIATION DOSE REDUCTION: This exam was performed according to the departmental dose-optimization program which includes automated exposure control, adjustment of the mA and/or kV according to patient size and/or use of iterative reconstruction technique. COMPARISON:  None Available. FINDINGS: Decreased sensitivity and specificity for detailed findings due to motion artifact. Cardiovascular: Normal heart size. No significant pericardial fluid/thickening. Great vessels are normal in course and caliber. Coronary artery calcifications and aortic atherosclerosis. Mediastinum/Nodes: Enlarged,  heterogeneous thyroid  gland with 1.6 cm enhancing right thyroid  nodule (2:4). Normal esophagus. No pathologically enlarged axillary, supraclavicular, mediastinal, or hilar lymph nodes. Patchy stranding of pericardiophrenic fat. Lungs/Pleura: The central airways are patent. Mild interlobular septal thickening. Mild thickening of the fissures. Subsegmental lingular and bilateral lower lobe relaxation atelectasis. No pneumothorax. Trace right and small left pleural effusions. Upper abdomen: Partially imaged ascites. Musculoskeletal: No acute or abnormal lytic or blastic osseous lesions. Multilevel degenerative changes of the thoracic spine. IMPRESSION: 1. Decreased sensitivity and specificity for detailed findings due to motion artifact. 2. Trace right and small left pleural effusion with mild interlobular septal thickening and thickening of the fissures, suggestive of pulmonary edema. 3. Partially imaged ascites. 4. Enlarged, heterogeneous thyroid  gland with 1.6 cm enhancing right thyroid  nodule. Recommend thyroid  US  (ref: J Am Coll Radiol. 2015 Feb;12(2): 143-50). 5.  Aortic Atherosclerosis (ICD10-I70.0). Electronically Signed   By: Limin  Xu M.D.   On: 09/04/2024 15:14    Scheduled Meds:  Chlorhexidine Gluconate Cloth  6 each Topical Daily   insulin  aspart  0-15 Units Subcutaneous TID WC   insulin  aspart  0-5 Units Subcutaneous QHS   Continuous Infusions:  cefTRIAXone (ROCEPHIN)  IV 2 g (09/05/24 0214)   metronidazole 500 mg (09/05/24 1552)     LOS: 6 days  MDM: Patient is high risk for one or more organ failure.  They necessitate ongoing hospitalization for continued IV therapies and subsequent lab monitoring. Total time spent interpreting labs and vitals, reviewing the medical record, coordinating care amongst consultants and care team members, directly assessing and discussing care with the patient and/or family: 55 min  Khaleb Broz, DO [facility]  To contact the attending physician  between 7A-7P please use Epic Chat. To contact the covering physician during after hours 7P-7A, please review Amion.  09/05/2024, 5:54 PM   *This document has been created with the assistance of dictation software. Please excuse typographical errors. *

## 2024-09-05 NOTE — Progress Notes (Signed)
 Speech Language Pathology Treatment: Dysphagia  Patient Details Name: Anita Mercer MRN: [id number] DOB: 12-Mar-1946 Today's Date: 09/05/2024 Time: 0815-0828 SLP Time Calculation (min) (ACUTE ONLY): 13 min  Assessment / Plan / Recommendation Clinical Impression  Pt seen for skilled dysphagia tx targeting diet tolerance and trials of upgraded textures. Pt alert, confused, and easily distracted. Predominantly kept eyes closed during session. Per NT, pt tolerating current diet without overt s/sx pharyngeal dysphagia. Observed pt with solid, puree, and thin (cup sips only). Pt without overt s/sx; however, s/sx mild oral dysphagia noted c/b mildly prolonged mastication of solids. Suspect oral deficits exacerbated by mental status. Recommend diet upgrade to Dysphagia 2 Diet with Thin Liquids with safe swallowing strategies/aspiration precautions as outlined below including, but not limited to, avoidance of straws. SLP to f/u per POC for diet tolerance. Nursing made aware of recommendations.   HPI HPI: Anita Mercer is a 78 y.o. female with medical history significant of neuromuscular disorder, diabetes, GERD, hyperlipidemia, essential hypertension, tenosynovitis, who was brought to the ER by her husband with a complaint of abdominal pain, anorexia and distention. Work up thus far notable for acute colitis and possible peritoneal carcinomatosis.      SLP Plan  Continue with current plan of care          Recommendations  Diet recommendations: Dysphagia 2 (fine chop);Thin liquid Liquids provided via: No straw Medication Administration: Whole meds with puree (vs crushed) Supervision: Staff to assist with self feeding;Full supervision/cueing for compensatory strategies Compensations: Minimize environmental distractions;Slow rate;Small sips/bites Postural Changes and/or Swallow Maneuvers: Seated upright 90 degrees;Upright 30-60 min after meal                  Oral care QID;Staff/trained  caregiver to provide oral care   Frequent or constant Supervision/Assistance Dysphagia, oropharyngeal phase (R13.12)     Continue with current plan of care    Delon Bangs, M.S., CCC-SLP Speech-Language Pathologist [facility] [phone] [place])  [name]  09/05/2024, 9:14 AM

## 2024-09-05 NOTE — Progress Notes (Signed)
 Brief Assessment Note  Medical record reviewed and patient has no TOC needs at this time. Please outreach to [facility] if needs are identified.

## 2024-09-05 NOTE — Plan of Care (Signed)

## 2024-09-06 DIAGNOSIS — K529 Noninfective gastroenteritis and colitis, unspecified: Secondary | ICD-10-CM | POA: Diagnosis not present

## 2024-09-06 LAB — GLUCOSE, CAPILLARY
Glucose-Capillary: 262 mg/dL — ABNORMAL HIGH (ref 70–99)
Glucose-Capillary: 271 mg/dL — ABNORMAL HIGH (ref 70–99)
Glucose-Capillary: 252 mg/dL — ABNORMAL HIGH (ref 70–99)
Glucose-Capillary: 216 mg/dL — ABNORMAL HIGH (ref 70–99)

## 2024-09-06 NOTE — TOC Initial Note (Addendum)
 Transition of Care [facility]) - Initial/Assessment Note    Patient Details  Name: Anita Mercer MRN: [id number] Date of Birth: January 14, 1946  Transition of Care [facility]) CM/SW Contact:    Marinda Cooks, RN Phone Number: 09/06/2024, 4:43 PM  Clinical Narrative:                 This CM spoke with pt's husbands Mr. Tramya Schoenfelder introduced role & completed Initial assessement. Mr Husband  reports pt and hisself  live address on demographics . Pt has family support provided by husband and neighbors per Mr. Reny . Pt uses [facility] & pt has established care with PCP Dr. Salli at the [facility] . Per Mr . Prien pt  has DME at home that includes RW and walking stick. Mr Oldenburg  reports pt has not  been in SNF prior to  this admission or have HH  set up  and was Independent with ADL'S . Mr.Grose informed he did assistance pt with bathing .    This CM discussed in great detail pt's DC recommendations for SNF/rehab and Mr. Aaron agreed with this DC plan. TOC will cont to follow  dc planning / care coordination and update as applicable.     Expected Discharge Plan and Services    SNF     Prior Living Arrangements/Services    Home with Husband no services were in place prior to this hospitalization per Mr.Chunn for pt.         Activities of Daily Living  Pt required limited assistance with ADL'S Per Mr. 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) 08/30/2024   B12 deficiency 07/12/2016   Gastroesophageal reflux disease with esophagitis 07/12/2016   Osteopenia determined by x-ray 07/09/2016   De Quervain's tenosynovitis, right 06/17/2016   Chronic fatigue 06/16/2016   Hearing loss 05/28/2014   Benign  essential tremor 05/10/2013   Allergic rhinitis 03/15/2013   Neuropathic pain 09/11/2012   Paroxysmal nerve pain 09/11/2012   Hyperlipidemia associated with type 2 diabetes mellitus (HCC) 10/01/2011   Essential hypertension 10/01/2011   Type 2 diabetes mellitus with diabetic polyneuropathy, with long-term current use of insulin  (HCC) 10/01/2011   PCP:  Shara [name], MD Pharmacy:   [facility] - [place], [place] - [address] [place] [address] Phone: [phone] Fax: [phone]     Social Drivers of Health (SDOH) Social History: SDOH Screenings   Food Insecurity: Patient Unable To Answer (08/31/2024)  Housing: Patient Unable To Answer (08/31/2024)  Transportation Needs: Patient Unable To Answer (08/31/2024)  Utilities: Patient Unable To Answer (08/31/2024)  Financial Resource Strain: Low Risk  (03/23/2024)   Received from [facility]  Physical Activity: Inactive (03/13/2024)   Received from [facility]  Social Connections: Patient Unable To Answer (08/31/2024)  Stress: Stress Concern Present (03/13/2024)   Received from [facility]  Tobacco Use: Low Risk  (08/30/2024)  Health Literacy: High Risk (03/13/2024)   Received from [facility]   SDOH Interventions:     Readmission Risk Interventions     No data to display

## 2024-09-06 NOTE — Plan of Care (Signed)
 PMT has been following waiting for cytology to result, and oncology to speak with patient regarding results.  Spoke with attending who states she has discussed results and plans moving forward with patient and family in great detail. Per our discussion, PMT will sign off.  Please reconsult if needs arise.

## 2024-09-06 NOTE — Inpatient Diabetes Management (Signed)
 Inpatient Diabetes Program Recommendations  AACE/ADA: New Consensus Statement on Inpatient Glycemic Control   Target Ranges:  Prepandial:   less than 140 mg/dL      Peak postprandial:   less than 180 mg/dL (1-2 hours)      Critically ill patients:  140 - 180 mg/dL     Latest Reference Range & Units 09/05/24 08:15 09/05/24 12:06 09/05/24 17:54 09/05/24 22:49  Glucose-Capillary 70 - 99 mg/dL 815 (H) 746 (H) 769 (H) 239 (H)   Review of Glycemic Control  Diabetes history: DM2 Outpatient Diabetes medications: Lantus  30 units BID, Metformin  1000 mg BID Current orders for Inpatient glycemic control: Novolog 0-15 units TID with meals, Novolog 0-5 units QHS  Inpatient Diabetes Program Recommendations:    Insulin : Please consider ordering Lantus  5 units Q24H.  Thanks, Earnie Gainer, RN, MSN, CDCES Diabetes Coordinator Inpatient Diabetes Program [phone] (Team Pager from 8am to 5pm)

## 2024-09-06 NOTE — Plan of Care (Signed)
  Problem: Education: Goal: Knowledge of General Education information will improve Description: Including pain rating scale, medication(s)/side effects and non-pharmacologic comfort measures Outcome: Progressing   Problem: Clinical Measurements: Goal: Ability to maintain clinical measurements within normal limits will improve Outcome: Progressing   Problem: Activity: Goal: Risk for activity intolerance will decrease Outcome: Progressing   Problem: Nutrition: Goal: Adequate nutrition will be maintained Outcome: Progressing   Problem: Coping: Goal: Level of anxiety will decrease Outcome: Progressing   Problem: Pain Managment: Goal: General experience of comfort will improve and/or be controlled Outcome: Progressing   Problem: Safety: Goal: Ability to remain free from injury will improve Outcome: Progressing   Problem: Education: Goal: Ability to describe self-care measures that may prevent or decrease complications (Diabetes Survival Skills Education) will improve Outcome: Progressing   Problem: Coping: Goal: Ability to adjust to condition or change in health will improve Outcome: Progressing

## 2024-09-06 NOTE — Progress Notes (Signed)
 Speech Language Pathology Treatment: Dysphagia  Patient Details Name: Anita Mercer MRN: [id number] DOB: [birth year] Today's Date: 09/06/2024 Time: 9169-9145 SLP Time Calculation (min) (ACUTE ONLY): 24 min  Assessment / Plan / Recommendation Clinical Impression  Attempted diet tolerance via skilled meal observation; however, pt sleeping/lethargic and decline POs when offered. Per husband, pt with significantly prolonged mastication of Dysphagia 2 solids yesterday. Suspect due to cognitive status/AMS. Husband declined pt with overt s/sx pharyngeal dysphagia when adhering to safe swallowing strategies. Discussed diet options and pt amenable to diet downgrade to puree and thin. Skilled education completed with husband re: diet recommendations, safe swallowing strategies/aspiration precautions, relationship between mental status and swallow function, and SLP POC. Recommend diet downgrade to puree and thin with safe swallowing strategies/aspiration precautions as outlined below. SLP to f/u x1 after the weekend for diet tolerance and consideration for diet upgrade pending improvements in mental status.    HPI HPI: Anita Mercer is a 78 y.o. female with medical history significant of neuromuscular disorder, diabetes, GERD, hyperlipidemia, essential hypertension, tenosynovitis, who was brought to the ER by her husband with a complaint of abdominal pain, anorexia and distention. Work up thus concerning for adenocarcinoma.      SLP Plan  Continue with current plan of care          Recommendations  Diet recommendations: Dysphagia 1 (puree);Thin liquid Liquids provided via: No straw Medication Administration: Whole meds with puree (vs crushed in puree) Supervision: Staff to assist with self feeding;Full supervision/cueing for compensatory strategies Compensations: Minimize environmental distractions;Slow rate;Small sips/bites Postural Changes and/or Swallow Maneuvers: Seated upright 90 degrees;Upright  30-60 min after meal                  Oral care QID;Staff/trained caregiver to provide oral care   Frequent or constant Supervision/Assistance Dysphagia, oropharyngeal phase (R13.12)     Continue with current plan of care    Delon Bangs, M.S., CCC-SLP Speech-Language Pathologist [facility] [phone] [place])  [name]  09/06/2024, 9:33 AM

## 2024-09-06 NOTE — Progress Notes (Signed)
 PROGRESS NOTE    Anita Mercer  FMW:[id number] DOB: 10/18/1946 DOA: 08/30/2024 PCP: Shara [name], MD  Chief Complaint  Patient presents with   Emesis   Abdominal Pain    Hospital Course:  Anita Mercer is a 78 year old female with neuromuscular disorder, diabetes, GERD, hypertension, tenosynovitis, dementia, who was brought to the ER with complaint of abdominal pain, distention, anorexia.  She was initially treated for infectious colitis, and possible ileus with NG tube.  NG tube was removed on 10/12 and patient has been doing well without.  CT reveals concern for peritoneal carcinomatosis.  Oncology was consulted.  Interventional radiology was consulted to perform paracentesis.  Cytology concerning for adenocarcinoma.  Per my discussion with oncology there will be no chemotherapy or surgical options available to this patient given her poor functional status and ultimate prognosis.  Subjective: Patient's husband is at bedside today.  We had extensive discussion regarding cytology results.  We discussed that given her functional decline with progressive dementia, and new finding of adenocarcinoma/peritoneal carcinomatosis, she is not a candidate for aggressive cancer therapies.  I advised the patient's husband that hospice is an appropriate choice at this time.  We discussed that her functional status is likely to continue to decline and subsequently her care will become more extensive.  As it currently stands the patient spends 80% of her day in bed, and her care is becoming more difficult for him to manage independently.  He agrees that the safest plan for the patient will be to discharge directly to a LTC/SNF with hospice services.   Objective: Vitals:   09/05/24 2030 09/06/24 0342 09/06/24 0400 09/06/24 0822  BP: 134/75 (!) 140/86  134/88  Pulse: (!) 106 (!) 109  (!) 110  Resp:    17  Temp: 98.9 F (37.2 C) 97.6 F (36.4 C) 98.6 F (37 C) 98 F (36.7 C)  TempSrc: Oral Oral  Oral   SpO2: 92% 93%  94%  Weight:      Height:        Intake/Output Summary (Last 24 hours) at 09/06/2024 1625 Last data filed at 09/06/2024 1300 Gross per 24 hour  Intake 785.06 ml  Output 480 ml  Net 305.06 ml   Filed Weights   08/30/24 1517  Weight: 65 kg    Examination: General exam: Intermittently moaning in pain. Respiratory system: No work of breathing, symmetric chest wall expansion Cardiovascular system: S1 & S2 heard, RRR.  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  Paracentesis 10/13 for cytology. -Patient is not a candidate for aggressive cancer therapies given her poor functional status and comorbidities.  We are recommending hospice at this time and patient's husband is amendable. - Have consulted TOC for assistance in placement.  Patient's care needs have exceeded what her husband is able to do at home.  She will require LTC/SNF with hospice services at DC.  Acute colitis - Currently on ceftriaxone and  Flagyl for infectious colitis.  To complete treatment today.  Distended stomach - Initially received NG tube which patient removed overnight 10/12. - General Surgery consulted, distention resolving. - Patient is now having bowel movements - Continue monitor closely - Continue with on liquid diet  Type 2  diabetes complicated by hypoglycemia - Hypoglycemia secondary to poor oral intake - Monitor CBGs closely.  Titrate insulin  needs daily  Acute urinary retention - Foley catheter inserted 10/10.  Will maintain for now. - Consider voiding trial if patient becomes more alert and ambulatory  Acute metabolic encephalopathy Dementia - At baseline patient is in bed 80% of the day but does walk and eat independently.  Husband endorses good quality of life 1 month ago prior to arrival but she has had aggressive decline over the last 3 weeks.  Her underlying malignancy is likely playing a role in this decline.  As above we will be discharging with hospice.  Essential tremor Hyperlipidemia - Resume home meds at DC  Hypokalemia - Supplement as needed  Hypernatremia - Due to free water deficit from prolonged n.p.o. - Improving now  AKI - Improved  DVT prophylaxis: Heparin   Code Status: Full Code Disposition: Will need to discharge to SNF/LTC with hospice.  TOC consulted and aware  Consultants:  Treatment Team:  Consulting Physician: Melanee Annah BROCKS, MD  Procedures:    Antimicrobials:  Anti-infectives (From admission, onward)    Start     Dose/Rate Route Frequency Ordered Stop   08/31/24 0300  cefTRIAXone (ROCEPHIN) 2 g in sodium chloride  0.9 % 100 mL IVPB        2 g 200 mL/hr over 30 Minutes Intravenous Every 24 hours 08/30/24 2028     08/31/24 0300  metroNIDAZOLE (FLAGYL) IVPB 500 mg        500 mg 100 mL/hr over 60 Minutes Intravenous Every 12 hours 08/30/24 2028     08/30/24 1845  piperacillin-tazobactam (ZOSYN) IVPB 3.375 g        3.375 g 100 mL/hr over 30 Minutes Intravenous  Once 08/30/24 1837 08/30/24 1954       Data Reviewed: I have personally reviewed following labs and imaging studies CBC: Recent Labs  Lab 08/31/24 0449 09/02/24 1220 09/03/24 0757 09/04/24 0604  WBC 7.7 6.0 5.9 5.7  NEUTROABS  --  4.9  --   --   HGB 13.4 13.1 12.3 11.5*  HCT 41.7 40.8 39.0  37.2  MCV 85.5 87.4 86.7 87.7  PLT 427* 318 300 275   Basic Metabolic Panel: Recent Labs  Lab 08/31/24 0449 09/01/24 0346 09/02/24 0625 09/03/24 0553 09/04/24 0604 09/05/24 0511  NA 140 143  --  148* 149* 145  K 3.4* 2.9* 3.3* 4.1  --   --   CL 107 107  --  107  --   --   CO2 24 27  --  17*  --   --   GLUCOSE 67* 136*  --  176*  --   --   BUN 35* 22  --  17  --   --   CREATININE 1.04* 0.85  --  0.93  --   --   CALCIUM 8.6* 8.0*  --  8.5*  --   --   MG  --  1.7  --  1.8 1.9  --   PHOS  --   --  2.5  --   --   --    GFR: Estimated Creatinine Clearance: 42.4 mL/min (by C-G formula  based on SCr of 0.93 mg/dL). Liver Function Tests: Recent Labs  Lab 08/31/24 0449 09/03/24 0553  AST 41 19  ALT 20 14  ALKPHOS 100 67  BILITOT 0.4 0.5  PROT 6.2* 5.9*  ALBUMIN 3.0* 2.4*   CBG: Recent Labs  Lab 09/05/24 1754 09/05/24 2249 09/06/24 0823 09/06/24 1139 09/06/24 1622  GLUCAP 230* 239* 262* 271* 252*    Recent Results (from the past 240 hours)  C Difficile Quick Screen w PCR reflex     Status: None   Collection Time: 09/01/24  6:40 AM   Specimen: STOOL  Result Value Ref Range Status   C Diff antigen NEGATIVE NEGATIVE Final   C Diff toxin NEGATIVE NEGATIVE Final   C Diff interpretation No C. difficile detected.  Final    Comment: Performed at [facility], [address] [place], [place] [address]  Aerobic/Anaerobic Culture w Gram Stain (surgical/deep wound)     Status: None (Preliminary result)   Collection Time: 09/03/24  3:05 PM   Specimen: Abdomen; Peritoneal Fluid  Result Value Ref Range Status   Specimen Description   Final    ABDOMEN Performed at [facility], [address] [place], [place] [address]    Special Requests   Final    NONE Performed at [facility], [address] [place], [place] [address]    Gram Stain NO WBC SEEN RARE GRAM NEGATIVE RODS   Final   Culture   Final    NO GROWTH 2 DAYS NO ANAEROBES ISOLATED;  CULTURE IN PROGRESS FOR 5 DAYS Performed at [facility], [address], [place], [place] [address]    Report Status PENDING  Incomplete     Radiology Studies: No results found.   Scheduled Meds:  Chlorhexidine Gluconate Cloth  6 each Topical Daily   heparin injection (subcutaneous)  5,000 Units Subcutaneous Q12H   insulin  aspart  0-15 Units Subcutaneous TID WC   insulin  aspart  0-5 Units Subcutaneous QHS   Continuous Infusions:  cefTRIAXone (ROCEPHIN)  IV Stopped (09/06/24 0238)   metronidazole 500 mg (09/06/24 1618)     LOS: 7 days  MDM: Patient is high risk for one or more organ failure.  They necessitate ongoing hospitalization for continued IV therapies and subsequent lab monitoring. Total time spent interpreting labs and vitals, reviewing the medical record, coordinating care amongst consultants and care team members, directly assessing and discussing care with the patient and/or family: 55 min  Jakeira Seeman, DO [facility]  To contact the attending physician between 7A-7P please use Epic Chat. To contact the covering physician during after hours 7P-7A, please review Amion.  09/06/2024, 4:25 PM   *This document has been created with the assistance of dictation software. Please excuse typographical errors. *

## 2024-09-06 NOTE — Plan of Care (Signed)
  Problem: Clinical Measurements: Goal: Ability to maintain clinical measurements within normal limits will improve Outcome: Progressing   Problem: Pain Managment: Goal: General experience of comfort will improve and/or be controlled Outcome: Progressing   Problem: Coping: Goal: Ability to adjust to condition or change in health will improve Outcome: Progressing   Problem: Tissue Perfusion: Goal: Adequacy of tissue perfusion will improve Outcome: Progressing

## 2024-09-07 DIAGNOSIS — Z515 Encounter for palliative care: Secondary | ICD-10-CM

## 2024-09-07 DIAGNOSIS — K529 Noninfective gastroenteritis and colitis, unspecified: Secondary | ICD-10-CM | POA: Diagnosis not present

## 2024-09-07 LAB — GLUCOSE, CAPILLARY: Glucose-Capillary: 300 mg/dL — ABNORMAL HIGH (ref 70–99)

## 2024-09-07 MED ORDER — GLYCOPYRROLATE 1 MG PO TABS: 1.0000 mg | ORAL_TABLET | ORAL | Status: DC | PRN

## 2024-09-07 MED ORDER — POLYVINYL ALCOHOL 1.4 % OP SOLN: 1.0000 [drp] | Freq: Four times a day (QID) | OPHTHALMIC | Status: DC | PRN

## 2024-09-07 MED ORDER — ACETAMINOPHEN 325 MG PO TABS: 650.0000 mg | ORAL_TABLET | Freq: Four times a day (QID) | ORAL | Status: DC | PRN

## 2024-09-07 MED ORDER — MORPHINE 100MG IN NS 100ML (1MG/ML) PREMIX INFUSION
0.0000 mg/h | INTRAVENOUS | Status: DC
  Administered 2024-09-07 – 2024-09-08 (×2): 5 mg/h via INTRAVENOUS
  Filled 2024-09-07 (×3): qty 100

## 2024-09-07 MED ORDER — SODIUM CHLORIDE 0.9 % IV SOLN: INTRAVENOUS | Status: AC

## 2024-09-07 MED ORDER — MORPHINE SULFATE (PF) 2 MG/ML IV SOLN
2.0000 mg | Freq: Once | INTRAVENOUS | Status: AC
  Administered 2024-09-07: 2 mg via INTRAVENOUS
  Filled 2024-09-07: qty 1

## 2024-09-07 MED ORDER — ACETAMINOPHEN 650 MG RE SUPP: 650.0000 mg | Freq: Four times a day (QID) | RECTAL | Status: DC | PRN

## 2024-09-07 MED ORDER — HALOPERIDOL LACTATE 5 MG/ML IJ SOLN: 2.5000 mg | INTRAMUSCULAR | Status: DC | PRN

## 2024-09-07 MED ORDER — GLYCOPYRROLATE 0.2 MG/ML IJ SOLN: 0.2000 mg | INTRAMUSCULAR | Status: DC | PRN

## 2024-09-07 MED ORDER — GLYCOPYRROLATE 0.2 MG/ML IJ SOLN
0.2000 mg | INTRAMUSCULAR | Status: DC | PRN
  Administered 2024-09-08 (×2): 0.2 mg via INTRAVENOUS
  Filled 2024-09-07 (×2): qty 1

## 2024-09-07 MED ORDER — MORPHINE BOLUS VIA INFUSION: 5.0000 mg | INTRAVENOUS | Status: DC | PRN

## 2024-09-07 NOTE — Progress Notes (Addendum)
 PROGRESS NOTE    Anita Mercer  FMW:[id number] DOB: 10/24/1946 DOA: 08/30/2024 PCP: Shara [name], MD  Chief Complaint  Patient presents with   Emesis   Abdominal Pain    Hospital Course:  Anita Mercer is a 78 year old female with neuromuscular disorder, diabetes, GERD, hypertension, tenosynovitis, dementia, who was brought to the ER with complaint of abdominal pain, distention, anorexia.  She was initially treated for infectious colitis, and possible ileus with NG tube.  NG tube was removed on 10/12 and patient has been doing well without.  CT reveals concern for peritoneal carcinomatosis.  Oncology was consulted.  Interventional radiology was consulted to perform paracentesis.  Cytology concerning for adenocarcinoma.  Per my discussion with oncology there will be no chemotherapy or surgical options available to this patient given her poor functional status and ultimate prognosis. Ultimately on 10/17 the patient's husband opted to transition to comfort measures only and we are pursuing outpatient hospice care.  Subjective: Anita Mercer is having difficulty with pain management this morning.  She is requiring increasing amounts of morphine.  Her husband is at bedside and we had extensive discussion regarding his wishes for her.  Ultimately his desire is for her to be pain-free.  He would like to proceed with comfort measures only and hospice care while in the hospital, while awaiting transition to outpatient hospice care  Objective: Vitals:   09/06/24 1626 09/06/24 1943 09/07/24 0429 09/07/24 0834  BP: 138/79 128/85 (!) 145/76 (!) 152/86  Pulse: (!) 115 (!) 117 (!) 114 (!) 114  Resp: 18 16 16 18   Temp: 97.6 F (36.4 C) 97.7 F (36.5 C) 97.7 F (36.5 C) 98.6 F (37 C)  TempSrc:  Oral Oral Oral  SpO2: 91% 94% 92% 92%  Weight:      Height:        Intake/Output Summary (Last 24 hours) at 09/07/2024 1714 Last data filed at 09/07/2024 1448 Gross per 24 hour  Intake 537.89 ml   Output 575 ml  Net -37.11 ml   Filed Weights   08/30/24 1517  Weight: 65 kg    Examination: General exam: Intermittently moaning in pain. Respiratory system: No work of breathing, symmetric chest wall expansion Cardiovascular system: S1 & S2 heard, RRR.  Gastrointestinal system: Abdomen is soft and distended, patient is moaning intermittently in pain  neuro: Sleeping, groans occasionally  Assessment & Plan:  Principal Problem:   Colitis Active Problems:   Hyperlipidemia associated with type 2 diabetes mellitus (HCC)   Essential hypertension   Type 2 diabetes mellitus with diabetic polyneuropathy, with long-term current use of insulin  (HCC)   Benign essential tremor   Gastroesophageal reflux disease with esophagitis   Delirium due to multiple etiologies, acute, hyperactive   Suspected Peritoneal carcinomatosis (HCC)    Comfort measures only - She is requiring increasing amounts of morphine.  Have started morphine drip. 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  Paracentesis 10/13 for cytology. -Patient is not a candidate for aggressive cancer therapies given her poor functional status and comorbidities  Acute colitis - s/p ceftriaxone and Flagyl for infectious colitis.    Distended stomach - Initially received NG tube which patient removed overnight 10/12. - General Surgery  consulted, distention resolving. - Patient is now having bowel movements  Type 2 diabetes complicated by hypoglycemia - Continue CBG monitoring.  Acute urinary retention - Foley catheter inserted 10/10.  Will maintain for now.  Acute metabolic  encephalopathy Dementia - At baseline patient is in bed 80% of the day but does walk and eat independently.  Husband endorses good quality of life 1 month ago prior to arrival but she has had aggressive decline over the last 3 weeks.  Her underlying malignancy is likely playing a role in this decline.  As above we will be discharging with hospice.  Essential tremor Hyperlipidemia Hypokalemia Hypernatremia AKI - Discontinue monitoring in light of hospice care  DVT prophylaxis: n/a   Code Status: Do not attempt resuscitation (DNR) - Comfort care Disposition: Will need to discharge to GIP/LTC with hospice.  TOC consulted and aware  Consultants:  Treatment Team:  Consulting Physician: Melanee Annah BROCKS, MD  Procedures:    Antimicrobials:  Anti-infectives (From admission, onward)    Start     Dose/Rate Route Frequency Ordered Stop   08/31/24 0300  cefTRIAXone (ROCEPHIN) 2 g in sodium chloride  0.9 % 100 mL IVPB  Status:  Discontinued        2 g 200 mL/hr over 30 Minutes Intravenous Every 24 hours 08/30/24 2028 09/07/24 1143   08/31/24 0300  metroNIDAZOLE (FLAGYL) IVPB 500 mg  Status:  Discontinued        500 mg 100 mL/hr over 60 Minutes Intravenous Every 12 hours 08/30/24 2028 09/07/24 1143   08/30/24 1845  piperacillin-tazobactam (ZOSYN) IVPB 3.375 g        3.375 g 100 mL/hr over 30 Minutes Intravenous  Once 08/30/24 1837 08/30/24 1954       Data Reviewed: I have personally reviewed following labs and imaging studies CBC: Recent Labs  Lab 09/02/24 1220 09/03/24 0757 09/04/24 0604  WBC 6.0 5.9 5.7  NEUTROABS 4.9  --   --   HGB 13.1 12.3 11.5*  HCT 40.8 39.0 37.2  MCV 87.4 86.7 87.7  PLT 318 300 275   Basic Metabolic Panel: Recent Labs  Lab 09/01/24 0346 09/02/24 0625 09/03/24 0553 09/04/24 0604 09/05/24 0511  NA 143  --  148* 149* 145  K 2.9* 3.3* 4.1  --   --   CL 107  --  107  --   --   CO2 27  --  17*  --   --   GLUCOSE 136*  --  176*  --   --   BUN 22  --   17  --   --   CREATININE 0.85  --  0.93  --   --   CALCIUM 8.0*  --  8.5*  --   --   MG 1.7  --  1.8 1.9  --   PHOS  --  2.5  --   --   --    GFR: Estimated Creatinine Clearance: 42.4 mL/min (by C-G formula based on SCr of 0.93 mg/dL). Liver Function Tests: Recent Labs  Lab 09/03/24 0553  AST 19  ALT 14  ALKPHOS 67  BILITOT 0.5  PROT 5.9*  ALBUMIN 2.4*   CBG: Recent Labs  Lab 09/06/24 0823 09/06/24 1139 09/06/24 1622 09/06/24 2033 09/07/24 0846  GLUCAP 262* 271* 252* 216* 300*    Recent Results (from the past 240 hours)  C Difficile Quick Screen w PCR reflex     Status: None   Collection Time: 09/01/24  6:40 AM  Specimen: STOOL  Result Value Ref Range Status   C Diff antigen NEGATIVE NEGATIVE Final   C Diff toxin NEGATIVE NEGATIVE Final   C Diff interpretation No C. difficile detected.  Final    Comment: Performed at [facility], [address] [place], [place] [address]  Aerobic/Anaerobic Culture w Gram Stain (surgical/deep wound)     Status: None (Preliminary result)   Collection Time: 09/03/24  3:05 PM   Specimen: Abdomen; Peritoneal Fluid  Result Value Ref Range Status   Specimen Description   Final    ABDOMEN Performed at [facility], [address], [place], [place] [address]    Special Requests   Final    NONE Performed at [facility], [address] [place], [place] [address]    Gram Stain NO WBC SEEN RARE GRAM NEGATIVE RODS   Final   Culture   Final    NO GROWTH 3 DAYS NO ANAEROBES ISOLATED; CULTURE IN PROGRESS FOR 5 DAYS Performed at [facility], [address], [place], [place] [address]    Report Status PENDING  Incomplete     Radiology Studies: No results found.   Scheduled Meds:  Chlorhexidine Gluconate Cloth  6 each Topical Daily   heparin injection (subcutaneous)  5,000 Units Subcutaneous Q12H   insulin  aspart  0-15 Units Subcutaneous TID WC   insulin  aspart  0-5 Units Subcutaneous QHS    Continuous Infusions:  sodium chloride  20 mL/hr at 09/07/24 1300   morphine 5 mg/hr (09/07/24 1517)     LOS: 8 days  MDM: Patient is high risk for one or more organ failure.  They necessitate ongoing hospitalization for continued IV therapies and subsequent lab monitoring. Total time spent interpreting labs and vitals, reviewing the medical record, coordinating care amongst consultants and care team members, directly assessing and discussing care with the patient and/or family: 55 min  Anita Alamo, DO [facility]  To contact the attending physician between 7A-7P please use Epic Chat. To contact the covering physician during after hours 7P-7A, please review Amion.  09/07/2024, 5:14 PM   *This document has been created with the assistance of dictation software. Please excuse typographical errors. *

## 2024-09-07 NOTE — Plan of Care (Signed)
  Problem: Pain Managment: Goal: General experience of comfort will improve and/or be controlled Outcome: Progressing   Problem: Safety: Goal: Ability to remain free from injury will improve Outcome: Progressing

## 2024-09-07 NOTE — Progress Notes (Incomplete)
 Hematology/Oncology Consult note [facility]  Telephone:([phone] [phone]  Patient Care Team: Shara [name], MD as PCP - General (Internal Medicine) Claudene Arthea HERO, DO as Attending Physician (Physical Medicine and Rehabilitation)   Name of the patient: Anita Mercer  [id number]  19-Oct-1946   Date of visit: @TODAY @  Diagnosis- ***  Chief complaint/ Reason for visit- ***  Heme/Onc history: ***  Interval history- ***  ECOG PS- *** Pain scale- *** Opioid associated constipation- ***  Review of systems- ROS    Allergies  Allergen Reactions   Gabapentin  Other (See Comments)    Experienced severe pain in limbs and tingling in hands  Other Reaction(s): Not available  Severe pain in limbs and tingling in hands  Severe pain in limbs and tingling in hands    Experienced severe pain in limbs and tingling in hands   Ciprofloxacin      Worsening symptoms of neuropathy   Conjugated Estrogens      Severe itching   Omeprazole Itching, Swelling and Other (See Comments)   Proton Pump Inhibitors     Other reaction(s): Angioedema--prilosec      Past Medical History:  Diagnosis Date   Actinic keratosis    Adopted    Cataract    Diabetes mellitus 2000   GERD (gastroesophageal reflux disease)    Hyperlipidemia    Hypertension    Neuromuscular disorder (HCC)    peripheral neuropathy   [facility] spotted fever)    Sinusitis    Tenosynovitis      Past Surgical History:  Procedure Laterality Date   ABDOMINAL HYSTERECTOMY     ACHILLES TENDON SURGERY  2006   removela of bone spur   BUNIONECTOMY     CATARACT EXTRACTION Bilateral 10/18/11  04/04/13   CHOLECYSTECTOMY     COLONOSCOPY  01/13/2011   COLONOSCOPY WITH PROPOFOL  N/A 08/11/2015   Procedure: COLONOSCOPY WITH PROPOFOL ;  Surgeon: Gladis RAYMOND Mariner, MD;  Location: [facility] ENDOSCOPY;  Service: Endoscopy;  Laterality: N/A;   echo  10/23/2012    ESOPHAGOGASTRODUODENOSCOPY (EGD) WITH PROPOFOL  N/A 08/11/2015   Procedure: ESOPHAGOGASTRODUODENOSCOPY (EGD) WITH PROPOFOL ;  Surgeon: Gladis RAYMOND Mariner, MD;  Location: [facility] ENDOSCOPY;  Service: Endoscopy;  Laterality: N/A;   FOOT SURGERY     Dr. Magdalen, bone spurs   GALLBLADDER SURGERY     skin tags     UPPER GASTROINTESTINAL ENDOSCOPY  08/08/2015   with MAC- barretts and duodenitis    Social History   Socioeconomic History   Marital status: Married    Spouse name: Not on file   Number of children: 1   Years of education: Not on file   Highest education level: Not on file  Occupational History   Occupation: Retired - Engineer, manufacturing    Comment: 31 yrs [employer]  Tobacco Use   Smoking status: Never   Smokeless tobacco: Never  Substance and Sexual Activity   Alcohol use: No   Drug use: No   Sexual activity: Never  Other Topics Concern   Not on file  Social History Narrative   Lives in [place] with husband. Moved from [place]. Orig from [place]. Daughter lives in [place] . Dogs at home. Engineer, manufacturing at [employer]. Hobbies - stained glass, woodcarving.      Diet: regular, limited carbs. Daily Caffeine Use:  Coffee 2-3   Regular Exercise -  Daily, yardwork   Social Drivers of Health   Financial Resource Strain: Low Risk  (03/23/2024)  Received from [facility]   Overall Financial Resource Strain (CARDIA)    Difficulty of Paying Living Expenses: Not hard at all  Food Insecurity: Patient Unable To Answer (08/31/2024)   Hunger Vital Sign    Worried About Running Out of Food in the Last Year: Patient unable to answer    Ran Out of Food in the Last Year: Patient unable to answer  Transportation Needs: Patient Unable To Answer (08/31/2024)   PRAPARE - Transportation    Lack of Transportation (Medical): Patient unable to answer    Lack of Transportation (Non-Medical): Patient unable to answer  Physical Activity: Inactive (03/13/2024)   Received from  [facility]   Exercise Vital Sign    On average, how many days per week do you engage in moderate to strenuous exercise (like a brisk walk)?: 0 days    On average, how many minutes do you engage in exercise at this level?: 0 min  Stress: Stress Concern Present (03/13/2024)   Received from [facility] - Occupational Stress Questionnaire    Feeling of Stress : Very much  Social Connections: Patient Unable To Answer (08/31/2024)   Social Connection and Isolation Panel    Frequency of Communication with Friends and Family: Patient unable to answer    Frequency of Social Gatherings with Friends and Family: Patient unable to answer    Attends Religious Services: Patient unable to answer    Active Member of Clubs or Organizations: Patient unable to answer    Attends Banker Meetings: Patient unable to answer    Marital Status: Patient unable to answer  Intimate Partner Violence: Patient Unable To Answer (08/31/2024)   Humiliation, Afraid, Rape, and Kick questionnaire    Fear of Current or Ex-Partner: Patient unable to answer    Emotionally Abused: Patient unable to answer    Physically Abused: Patient unable to answer    Sexually Abused: Patient unable to answer    Family History  Adopted: Yes     Current Facility-Administered Medications:    0.9 %  sodium chloride  infusion, , Intravenous, Continuous, Dezii, Alexandra, DO, Last Rate: 20 mL/hr at 09/07/24 1300, New Bag at 09/07/24 1300   acetaminophen (TYLENOL) tablet 650 mg, 650 mg, Oral, Q6H PRN **OR** acetaminophen (TYLENOL) suppository 650 mg, 650 mg, Rectal, Q6H PRN, Dezii, Alexandra, DO   artificial tears ophthalmic solution 1 drop, 1 drop, Both Eyes, QID PRN, Dezii, Alexandra, DO   Chlorhexidine Gluconate Cloth 2 % PADS 6 each, 6 each, Topical, Daily, Awanda City, MD, 6 each at 09/07/24 1055   diazepam (VALIUM) injection 5 mg, 5 mg, Intravenous, Q4H PRN, Sim, Mohammad L,  MD, 5 mg at 09/04/24 1000   glycopyrrolate (ROBINUL) tablet 1 mg, 1 mg, Oral, Q4H PRN **OR** glycopyrrolate (ROBINUL) injection 0.2 mg, 0.2 mg, Subcutaneous, Q4H PRN **OR** glycopyrrolate (ROBINUL) injection 0.2 mg, 0.2 mg, Intravenous, Q4H PRN, Dezii, Alexandra, DO   haloperidol lactate (HALDOL) injection 2.5-5 mg, 2.5-5 mg, Intravenous, Q4H PRN, Dezii, Alexandra, DO   heparin injection 5,000 Units, 5,000 Units, Subcutaneous, Q12H, Dezii, Alexandra, DO, 5,000 Units at 09/07/24 1049   insulin  aspart (novoLOG) injection 0-15 Units, 0-15 Units, Subcutaneous, TID WC, Awanda City, MD, 5 Units at 09/07/24 1048   insulin  aspart (novoLOG) injection 0-5 Units, 0-5 Units, Subcutaneous, QHS, Awanda [place], MD, 2 Units at 09/06/24 2122   morphine 100mg  in NS (1mg /mL) infusion - premix, 0-20 mg/hr, Intravenous, Continuous, Dezii, Alexandra,  DO, Last Rate: 5 mL/hr at 09/07/24 1517, 5 mg/hr at 09/07/24 1517   morphine bolus via infusion 5 mg, 5 mg, Intravenous, Q5 min PRN, Dezii, Alexandra, DO   ondansetron (ZOFRAN) tablet 4 mg, 4 mg, Oral, Q6H PRN **OR** ondansetron (ZOFRAN) injection 4 mg, 4 mg, Intravenous, Q6H PRN, Sim Emery CROME, MD, 4 mg at 09/04/24 2154  Physical exam:  Vitals:   09/06/24 1626 09/06/24 1943 09/07/24 0429 09/07/24 0834  BP: 138/79 128/85 (!) 145/76 (!) 152/86  Pulse: (!) 115 (!) 117 (!) 114 (!) 114  Resp: 18 16 16 18   Temp: 97.6 F (36.4 C) 97.7 F (36.5 C) 97.7 F (36.5 C) 98.6 F (37 C)  TempSrc:  Oral Oral Oral  SpO2: 91% 94% 92% 92%  Weight:      Height:       Physical Exam   I have personally reviewed labs listed below:    Latest Ref Rng & Units 09/05/2024    5:11 AM  CMP  Sodium 135 - 145 mmol/L 145       Latest Ref Rng & Units 09/04/2024    6:04 AM  CBC  WBC 4.0 - 10.5 K/uL 5.7   Hemoglobin 12.0 - 15.0 g/dL 88.4   Hematocrit 63.9 - 46.0 % 37.2   Platelets 150 - 400 K/uL 275    I have personally reviewed Radiology images listed below: @IMAGES @  CT  CHEST WO CONTRAST Result Date: 09/04/2024 CLINICAL DATA:  Evaluation of occult malignancy EXAM: CT CHEST WITHOUT CONTRAST TECHNIQUE: Multidetector CT imaging of the chest was performed following the standard protocol without IV contrast. RADIATION DOSE REDUCTION: This exam was performed according to the departmental dose-optimization program which includes automated exposure control, adjustment of the mA and/or kV according to patient size and/or use of iterative reconstruction technique. COMPARISON:  None Available. FINDINGS: Decreased sensitivity and specificity for detailed findings due to motion artifact. Cardiovascular: Normal heart size. No significant pericardial fluid/thickening. Great vessels are normal in course and caliber. Coronary artery calcifications and aortic atherosclerosis. Mediastinum/Nodes: Enlarged, heterogeneous thyroid  gland with 1.6 cm enhancing right thyroid  nodule (2:4). Normal esophagus. No pathologically enlarged axillary, supraclavicular, mediastinal, or hilar lymph nodes. Patchy stranding of pericardiophrenic fat. Lungs/Pleura: The central airways are patent. Mild interlobular septal thickening. Mild thickening of the fissures. Subsegmental lingular and bilateral lower lobe relaxation atelectasis. No pneumothorax. Trace right and small left pleural effusions. Upper abdomen: Partially imaged ascites. Musculoskeletal: No acute or abnormal lytic or blastic osseous lesions. Multilevel degenerative changes of the thoracic spine. IMPRESSION: 1. Decreased sensitivity and specificity for detailed findings due to motion artifact. 2. Trace right and small left pleural effusion with mild interlobular septal thickening and thickening of the fissures, suggestive of pulmonary edema. 3. Partially imaged ascites. 4. Enlarged, heterogeneous thyroid  gland with 1.6 cm enhancing right thyroid  nodule. Recommend thyroid  US  (ref: J Am Coll Radiol. 2015 Feb;12(2): 143-50). 5.  Aortic Atherosclerosis  (ICD10-I70.0). Electronically Signed   By: Limin  Xu M.D.   On: 09/04/2024 15:14   US  Paracentesis Result Date: 09/04/2024 INDICATION: 78 year old female with a history of dementia and recently worsening abdominal pain with distension. Request for diagnostic paracentesis. EXAM: ULTRASOUND GUIDED DIAGNOSTIC, LEFT-SIDED PARACENTESIS MEDICATIONS: 1% lidocaine, 10 mL. COMPLICATIONS: None immediate. PROCEDURE: Informed written consent was obtained from the patient after a discussion of the risks, benefits and alternatives to treatment. A timeout was performed prior to the initiation of the procedure. Initial ultrasound scanning demonstrates a large amount of ascites within the left lower  abdominal quadrant. The left lower abdomen was prepped and draped in the usual sterile fashion. 1% lidocaine was used for local anesthesia. Following this, a 19 gauge, 10-cm, Yueh catheter was introduced. An ultrasound image was saved for documentation purposes. The paracentesis was performed. The catheter was removed and a dressing was applied. The patient tolerated the procedure well without immediate post procedural complication. FINDINGS: A total of approximately 100 mL of serous fluid was removed. Samples were sent to the laboratory as requested by the clinical team. IMPRESSION: Successful ultrasound-guided paracentesis yielding 100 mL of peritoneal fluid. Procedure performed by: Sherrilee Bal, PA-C under the supervision of Dr. KANDICE Banner Electronically Signed   By: Cordella Banner   On: 09/04/2024 12:51   DG Abd Portable 1V Result Date: 09/03/2024 CLINICAL DATA:  Small bowel obstruction. EXAM: PORTABLE ABDOMEN - 1 VIEW COMPARISON:  09/02/2024 FINDINGS: Minimal gaseous distention of small bowel in the left abdomen identified measuring up to 3.0 cm diameter. Gas is seen scattered along the length of a nondilated colon. Diffuse degenerative change in the lumbar spine. IMPRESSION: Minimal gaseous distention of small bowel in  the left abdomen. Electronically Signed   By: Camellia Candle M.D.   On: 09/03/2024 10:14   DG Abd 1 View Result Date: 09/02/2024 EXAM: 1 VIEW XRAY OF THE ABDOMEN 09/02/2024 05:20:28 AM COMPARISON: 08/31/2024 CLINICAL HISTORY: Abdominal distention FINDINGS: LINES, TUBES AND DEVICES: Enteric tube in place with tip and side port projecting over the stomach. BOWEL: Centralized bowel loops within the lower abdomen, which can be seen in the setting of ascites. Nonobstructive bowel gas pattern. SOFT TISSUES: Vascular calcifications. Surgical material in the right hemiabdomen. No opaque urinary calculi. BONES: Degenerative changes of the lumbar spine. No acute osseous abnormality. IMPRESSION: 1. Centralized bowel loops within the lower abdomen, which can be seen with ascites 2. Enteric tube tip and side port projecting over the stomach Electronically signed by: Waddell Calk MD 09/02/2024 09:10 AM EDT RP Workstation: [id number]   CT ABDOMEN PELVIS WO CONTRAST Result Date: 08/31/2024 CLINICAL DATA:  Diffuse abdominal pain EXAM: CT ABDOMEN AND PELVIS WITHOUT CONTRAST TECHNIQUE: Multidetector CT imaging of the abdomen and pelvis was performed following the standard protocol without IV contrast. RADIATION DOSE REDUCTION: This exam was performed according to the departmental dose-optimization program which includes automated exposure control, adjustment of the mA and/or kV according to patient size and/or use of iterative reconstruction technique. COMPARISON:  CT from the previous day. FINDINGS: Lower chest: Small pleural effusions are noted bilaterally with associated basilar atelectasis. This has increased slightly in the interval from the prior exam. Hepatobiliary: The degree of perihepatic fluid appears to have increased slightly in the interval from the prior exam. No definitive hepatic mass is seen. The gallbladder has been surgically removed. Pancreas: Unremarkable. No pancreatic ductal dilatation or surrounding  inflammatory changes. Spleen: Spleen is within normal limits although increased perisplenic fluid is noted when compared with the prior study. Adrenals/Urinary Tract: Adrenal glands are within normal limits. Kidneys demonstrate excretion from prior CT examination. No mass lesion is seen. No obstructive changes are noted. The bladder is decompressed by Foley catheter. Stomach/Bowel: Scattered mild diverticular change of the colon is noted. Fluid is noted throughout the colon it may be related to an underlying diarrheal state. No obstructive changes are seen. The appendix is well visualized and similar to that seen on the prior exam. No perforation is identified. Some inflammatory changes noted in the right lower quadrant similar to that seen on prior exam. This  may simply be related to underlying inflammatory change of the cecum. No obstructive changes of the small bowel are noted. Stomach is well distended with a gastric catheter within. Vascular/Lymphatic: Atherosclerotic calcifications are identified. Scattered lymph nodes are noted surrounding the celiac axis as well as in the right lower quadrant. Reproductive: Uterus has been surgically removed. No definitive adnexal mass is noted. Other: The omental thickening and nodularity is similar to that seen on the previous day. This along with the ascites is again suspicious for peritoneal carcinomatosis. No free air is noted to suggest perforation Musculoskeletal: Degenerative changes of the thoracolumbar spine are noted. No acute abnormality is seen. IMPRESSION: Increase in small pleural effusions and basilar atelectasis when compared with the previous day. Mild increase in the degree of ascites when compared with the previous day. Stable omental thickening and nodular densities suspicious for carcinomatosis. No findings to suggest acute perforation are noted. The inflammatory changes in the right lower quadrant are again identified. The appendix does not appear to  be involved. Previously seen gastric distension has resolved. Electronically Signed   By: Oneil Devonshire M.D.   On: 08/31/2024 20:37   DG Abd 1 View Result Date: 08/31/2024 CLINICAL DATA:  Nasogastric tube placement. EXAM: ABDOMEN - 1 VIEW COMPARISON:  Radiograph yesterday FINDINGS: Tip and side port of the enteric tube below the diaphragm in the stomach. Excreted IV contrast in the renal collecting systems from prior CT. No bowel dilatation in the upper abdomen. IMPRESSION: Tip and side port of the enteric tube below the diaphragm in the stomach. Electronically Signed   By: Andrea Gasman M.D.   On: 08/31/2024 14:37   DG Abd 1 View Result Date: 08/30/2024 CLINICAL DATA:  Check gastric catheter placement EXAM: ABDOMEN - 1 VIEW COMPARISON:  None Available. FINDINGS: Gastric catheter is noted in the distal stomach. No free air is seen. No other focal abnormality is noted. IMPRESSION: Gastric catheter within the stomach. Electronically Signed   By: Oneil Devonshire M.D.   On: 08/30/2024 23:09   CT ABDOMEN PELVIS W CONTRAST Result Date: 08/30/2024 CLINICAL DATA:  Acute generalized abdominal pain, hematemesis. EXAM: CT ABDOMEN AND PELVIS WITH CONTRAST TECHNIQUE: Multidetector CT imaging of the abdomen and pelvis was performed using the standard protocol following bolus administration of intravenous contrast. RADIATION DOSE REDUCTION: This exam was performed according to the departmental dose-optimization program which includes automated exposure control, adjustment of the mA and/or kV according to patient size and/or use of iterative reconstruction technique. CONTRAST:  80mL OMNIPAQUE  IOHEXOL  300 MG/ML  SOLN COMPARISON:  August 29, 2023. FINDINGS: Lower chest: Stable nodule seen in lingular segment left upper lobe. Mild left lingular subsegmental atelectasis or infiltrate is noted. Hepatobiliary: No focal liver abnormality is seen. Status post cholecystectomy. No biliary dilatation. Pancreas: Unremarkable. No  pancreatic ductal dilatation or surrounding inflammatory changes. Spleen: Normal in size without focal abnormality. Adrenals/Urinary Tract: Adrenal glands are unremarkable. Kidneys are normal, without renal calculi, focal lesion, or hydronephrosis. Bladder is unremarkable. Stomach/Bowel: Severe gastric distention is noted with moderate wall thickening of gastric antrum suggesting peptic ulcer disease. Severe inflammatory changes are seen involving the cecum suggesting infectious or inflammatory colitis. The appendix appears to be thickened and inflamed suggesting either appendicitis or inflammation secondary to cecal inflammation. Mildly dilated small bowel loops are noted which may represent ileus secondary to inflammation. Vascular/Lymphatic: Aortic atherosclerosis. Enlarged lymph nodes are noted in mesentery of right lower quadrant which most likely are inflammatory in etiology. Reproductive: Status post hysterectomy. No adnexal  masses. Other: Mild ascites is noted in the abdomen and pelvis and around the liver and spleen. Nodular densities are noted anteriorly in the pelvis and involving the omentum concerning for peritoneal carcinomatosis. This includes 17 mm lymph node anteriorly on image number 48 of series 2. Musculoskeletal: No acute or significant osseous findings. IMPRESSION: 1. Severe gastric distention is noted with moderate wall thickening of gastric antrum suggesting peptic ulcer disease. 2. Severe inflammatory changes are seen involving the cecum suggesting infectious or inflammatory colitis. The appendix appears to be thickened and inflamed suggesting either appendicitis or inflammation secondary to cecal inflammation. 3. Mild ascites is noted in the abdomen and pelvis and around the liver and spleen. 4. Nodular densities are noted anteriorly in the pelvis and involving the omentum concerning for peritoneal carcinomatosis. This includes 17 mm lymph node anteriorly. 5. Mildly dilated small bowel  loops are noted which may represent ileus secondary to inflammation. 6. Stable nodule seen in lingular segment left upper lobe. Mild left lingular subsegmental atelectasis or infiltrate is noted. 7. Aortic atherosclerosis. Aortic Atherosclerosis (ICD10-I70.0). Electronically Signed   By: Lynwood Landy Raddle M.D.   On: 08/30/2024 18:03     Assessment and plan- Patient is a 78 y.o. female ***   Visit Diagnosis 1. Colitis   2. Ileus (HCC)   3. Hematemesis with nausea      Dr. Annah Skene, MD, MPH [facility] [phone] 09/07/2024 3:42 PM

## 2024-09-07 NOTE — Inpatient Diabetes Management (Signed)
 Inpatient Diabetes Program Recommendations  AACE/ADA: New Consensus Statement on Inpatient Glycemic Control (2015)  Target Ranges:  Prepandial:   less than 140 mg/dL      Peak postprandial:   less than 180 mg/dL (1-2 hours)      Critically ill patients:  140 - 180 mg/dL    Latest Reference Range & Units 09/06/24 08:23 09/06/24 11:39 09/06/24 16:22 09/06/24 20:33  Glucose-Capillary 70 - 99 mg/dL 737 (H)  8 units Novolog  271 (H)  8 units Novolog  252 (H)  8 units Novolog  216 (H)  2 units Novolog   (H): Data is abnormally high  Latest Reference Range & Units 09/07/24 08:46  Glucose-Capillary 70 - 99 mg/dL 699 (H)  (H): Data is abnormally high    Home DM Meds: Lantus  30 units BID       Metformin  1000 mg BID  Current Orders: Novolog Moderate Correction Scale/ SSI (0-15 units) TID AC + HS    MD- Note AM CBGs elevated >250 the last 2 days  Takes Lantus  30 units BID at home  Please consider starting Lantus  8 units BID (25% total home dose)    --Will follow patient during hospitalization--  Adina Rudolpho Arrow RN, MSN, CDCES Diabetes Coordinator Inpatient Glycemic Control Team Team Pager: [phone] (8a-5p)

## 2024-09-07 NOTE — Plan of Care (Signed)
  Problem: Clinical Measurements: Goal: Ability to maintain clinical measurements within normal limits will improve Outcome: Progressing   Problem: Elimination: Goal: Will not experience complications related to bowel motility Outcome: Progressing   Problem: Pain Managment: Goal: General experience of comfort will improve and/or be controlled Outcome: Progressing   Problem: Safety: Goal: Ability to remain free from injury will improve Outcome: Progressing   Problem: Skin Integrity: Goal: Risk for impaired skin integrity will decrease Outcome: Progressing   Problem: Metabolic: Goal: Ability to maintain appropriate glucose levels will improve Outcome: Progressing

## 2024-09-08 DIAGNOSIS — K529 Noninfective gastroenteritis and colitis, unspecified: Secondary | ICD-10-CM | POA: Diagnosis not present

## 2024-09-08 NOTE — TOC Transition Note (Signed)
 Transition of Care [facility]) - Discharge Note   Patient Details  Name: Anita Mercer MRN: [id number] Date of Birth: [birth year]  Transition of [facility] CM/SW Contact:  Marinda Cooks, RN Phone Number: 09/08/2024, 5:24 PM   Clinical Narrative:     This CM updated by covering MD pt medically cleared to dc today and has active DC order . This CM alerted by hospice team pt admitted to [facility] with [facility]  DC transportation confirmed for pt with  bedside RN.  No additional TOC /DC needs requested by medical team or identified by CM at this time .    Final next level of care: Other (comment) (IPU with [facility]) Barriers to Discharge: No Barriers Identified   Patient Goals and CMS Choice            Discharge Placement                       Discharge Plan and Services Additional resources added to the After Visit Summary for                                       Social Drivers of Health (SDOH) Interventions SDOH Screenings   Food Insecurity: Patient Unable To Answer (08/31/2024)  Housing: Patient Unable To Answer (08/31/2024)  Transportation Needs: Patient Unable To Answer (08/31/2024)  Utilities: Patient Unable To Answer (08/31/2024)  Financial Resource Strain: Low Risk  (03/23/2024)   Received from [facility]  Physical Activity: Inactive (03/13/2024)   Received from [facility]  Social Connections: Patient Unable To Answer (08/31/2024)  Stress: Stress Concern Present (03/13/2024)   Received from [facility]  Tobacco Use: Low Risk  (08/30/2024)  Health Literacy: High Risk (03/13/2024)   Received from [facility]     Readmission Risk Interventions     No data to display

## 2024-09-08 NOTE — Progress Notes (Signed)
 [facility] assumes care of Pt for transfer to Hospice. Pt transferred to stretcher via 3 without incident or change in condition. VS within norms as assessed by [facility] personnel. Pt pain level 0 per PAINAID scale. All belongings secured by [facility], with full report to same. Pt departs unit without change in condition.

## 2024-09-08 NOTE — Plan of Care (Signed)
 Pt is comfort care; slept through the night no discomfort. Problem: Clinical Measurements: Goal: Ability to maintain clinical measurements within normal limits will improve Outcome: Progressing   Problem: Pain Managment: Goal: General experience of comfort will improve and/or be controlled Outcome: Progressing   Problem: Coping: Goal: Ability to adjust to condition or change in health will improve Outcome: Progressing

## 2024-09-08 NOTE — Progress Notes (Signed)
 [facility] Liaison Note  Received a referral for evaluation for Hospice InPatient Unit [facility]) by Dr. Juleen, who informs me patient's husband, [name] in interested in Hospice IPU.  Met with patient's husband [name] to initiate education related to hospice philosophy, team approach to dare and comfort approach.  He is in agreement and request evaluation for IPU.    Patient has been approved for GIP Level of Care by Dr. Norleen Laurence, Hospice MD.  We can transport patient today after hospice consents are completed.  Hospital RN to call report to the Hospice Home at [phone].  Please ensure portable DNR accompanies patient to the IPU.  Please medicate patient prior to transport for comfort as needed.  Dr. Juleen and medical team aware of the above information.  Thank you for allowing participation in this patient's care.  Anita HILARIO Na, Rn Nurse Liaison [phone]

## 2024-09-08 NOTE — Progress Notes (Signed)
 Wrong phone number given for report. Called and gave report. Scheduled for pickup at 5 pm

## 2024-09-08 NOTE — Discharge Summary (Signed)
 DISCHARGE SUMMARY    Anita Mercer FMW:[id number] DOB: 02-13-1946 DOA: 08/30/2024  PCP: Shara Anita Blase, MD  Admit date: 08/30/2024 Discharge date: 09/08/2024   Recommendations for Outpatient Follow-up:  Inpatient Hospice   Hospital Course: Anita Mercer is a 78 year old female with neuromuscular disorder, diabetes, GERD, hypertension, tenosynovitis, dementia, who was brought to the ER with complaint of abdominal pain, distention, anorexia.  She was initially treated for infectious colitis, and possible ileus with NG tube.  NG tube was removed on 10/12 and patient has been doing well without.  CT reveals concern for peritoneal carcinomatosis.  Oncology was consulted.  Interventional radiology was consulted to perform paracentesis.  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  Given patient's comorbidities and advanced age, there will be no chemotherapy or surgical options available. Ultimately on 10/17 the patient's husband opted to transition to comfort measures only.  Patient is requiring increasing amounts of IV pain therapy.  Today we are discharging directly to inpatient hospice care.  Comfort measures only - She is requiring increasing amounts of morphine.  Have started morphine drip. 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  Paracentesis 10/13 for cytology. -Patient is not a candidate for aggressive cancer therapies given her poor functional status and comorbidities Acute colitis - s/p ceftriaxone and Flagyl for infectious colitis.   Distended stomach - Initially received NG tube which patient removed overnight 10/12. - General Surgery consulted, distention resolving. - Patient is now having bowel movements Type 2 diabetes complicated by hypoglycemia Acute urinary retention - Foley catheter inserted 10/10.  Will maintain for  now. Acute metabolic encephalopathy Dementia - At baseline patient is in bed 80% of the day but does walk and eat independently.  Husband endorses good quality of life 1 month ago prior to arrival but she has had aggressive decline over the last 3 weeks.  Her underlying malignancy is likely playing a role in this decline.  As above we will be discharging with hospice.  Essential tremor Hyperlipidemia Hypokalemia Hypernatremia AKI - Discontinue monitoring in light of hospice care  Discharge Instructions  Discharge Instructions     Call MD for:  difficulty breathing, headache or visual disturbances   Complete by: As directed    Call MD for:  persistant dizziness or light-headedness   Complete by: As directed    Call MD for:  persistant nausea and vomiting   Complete by: As directed    Call MD for:  severe uncontrolled pain   Complete by: As directed    Call MD for:  temperature >100.4   Complete by: As directed    Diet general   Complete by: As directed       Allergies as of 09/08/2024       Reactions   Gabapentin  Other (See Comments)   Experienced severe pain in limbs and tingling in hands Other Reaction(s): Not available Severe pain in limbs and tingling in hands Severe pain in limbs and tingling in hands    Experienced severe pain in limbs and tingling in hands   Ciprofloxacin     Worsening symptoms of neuropathy   Conjugated Estrogens     Severe itching   Omeprazole Itching, Swelling, Other (See Comments)   Proton Pump Inhibitors    Other reaction(s): Angioedema--prilosec         Medication List     STOP taking these medications    aspirin EC 81 MG tablet  cyanocobalamin  1000 MCG tablet Commonly known as: VITAMIN B12   cyanocobalamin  1000 MCG/ML injection Commonly known as: VITAMIN B12   dicyclomine 10 MG capsule Commonly known as: BENTYL   estradiol  0.1 MG/GM vaginal cream Commonly known as: ESTRACE    famotidine 40 MG tablet Commonly known as:  PEPCID   glucose blood test strip   insulin  glargine 100 UNIT/ML Solostar Pen Commonly known as: Lantus  SoloStar   lisinopril  10 MG tablet Commonly known as: ZESTRIL    loratadine 10 MG tablet Commonly known as: CLARITIN   metFORMIN  1000 MG tablet Commonly known as: GLUCOPHAGE    onetouch ultrasoft lancets   OT ULTRA/FASTTK CNTRL SOLN Soln   Pen Needles 32G X 4 MM Misc   simvastatin  20 MG tablet Commonly known as: ZOCOR    traMADol  50 MG tablet Commonly known as: ULTRAM    traZODone 50 MG tablet Commonly known as: DESYREL   Vitamin D 50 MCG (2000 UT) Caps       TAKE these medications    DULoxetine 20 MG capsule Commonly known as: CYMBALTA Take 20 mg by mouth daily.        Follow-up Information     Hutchins, Anita Blase, MD Follow up.   Specialty: Internal Medicine Why: hospital follow up Contact information: [address] [place] [address] [phone]                Allergies  Allergen Reactions   Gabapentin  Other (See Comments)    Experienced severe pain in limbs and tingling in hands  Other Reaction(s): Not available  Severe pain in limbs and tingling in hands  Severe pain in limbs and tingling in hands    Experienced severe pain in limbs and tingling in hands   Ciprofloxacin      Worsening symptoms of neuropathy   Conjugated Estrogens      Severe itching   Omeprazole Itching, Swelling and Other (See Comments)   Proton Pump Inhibitors     Other reaction(s): Angioedema--prilosec     Consultations: Treatment Team:  Melanee Annah BROCKS, MD   Procedures/Studies: CT CHEST WO CONTRAST Result Date: 09/04/2024 CLINICAL DATA:  Evaluation of occult malignancy EXAM: CT CHEST WITHOUT CONTRAST TECHNIQUE: Multidetector CT imaging of the chest was performed following the standard protocol without IV contrast. RADIATION DOSE REDUCTION: This exam was performed according to the departmental dose-optimization program which  includes automated exposure control, adjustment of the mA and/or kV according to patient size and/or use of iterative reconstruction technique. COMPARISON:  None Available. FINDINGS: Decreased sensitivity and specificity for detailed findings due to motion artifact. Cardiovascular: Normal heart size. No significant pericardial fluid/thickening. Great vessels are normal in course and caliber. Coronary artery calcifications and aortic atherosclerosis. Mediastinum/Nodes: Enlarged, heterogeneous thyroid  gland with 1.6 cm enhancing right thyroid  nodule (2:4). Normal esophagus. No pathologically enlarged axillary, supraclavicular, mediastinal, or hilar lymph nodes. Patchy stranding of pericardiophrenic fat. Lungs/Pleura: The central airways are patent. Mild interlobular septal thickening. Mild thickening of the fissures. Subsegmental lingular and bilateral lower lobe relaxation atelectasis. No pneumothorax. Trace right and small left pleural effusions. Upper abdomen: Partially imaged ascites. Musculoskeletal: No acute or abnormal lytic or blastic osseous lesions. Multilevel degenerative changes of the thoracic spine. IMPRESSION: 1. Decreased sensitivity and specificity for detailed findings due to motion artifact. 2. Trace right and small left pleural effusion with mild interlobular septal thickening and thickening of the fissures, suggestive of pulmonary edema. 3. Partially imaged ascites. 4. Enlarged, heterogeneous thyroid  gland with 1.6 cm enhancing right thyroid  nodule.  Recommend thyroid  US  (ref: J Am Coll Radiol. 2015 Feb;12(2): 143-50). 5.  Aortic Atherosclerosis (ICD10-I70.0). Electronically Signed   By: Limin  Xu M.D.   On: 09/04/2024 15:14   US  Paracentesis Result Date: 09/04/2024 INDICATION: 78 year old female with a history of dementia and recently worsening abdominal pain with distension. Request for diagnostic paracentesis. EXAM: ULTRASOUND GUIDED DIAGNOSTIC, LEFT-SIDED PARACENTESIS MEDICATIONS: 1%  lidocaine, 10 mL. COMPLICATIONS: None immediate. PROCEDURE: Informed written consent was obtained from the patient after a discussion of the risks, benefits and alternatives to treatment. A timeout was performed prior to the initiation of the procedure. Initial ultrasound scanning demonstrates a large amount of ascites within the left lower abdominal quadrant. The left lower abdomen was prepped and draped in the usual sterile fashion. 1% lidocaine was used for local anesthesia. Following this, a 19 gauge, 10-cm, Yueh catheter was introduced. An ultrasound image was saved for documentation purposes. The paracentesis was performed. The catheter was removed and a dressing was applied. The patient tolerated the procedure well without immediate post procedural complication. FINDINGS: A total of approximately 100 mL of serous fluid was removed. Samples were sent to the laboratory as requested by the clinical team. IMPRESSION: Successful ultrasound-guided paracentesis yielding 100 mL of peritoneal fluid. Procedure performed by: Sherrilee Bal, PA-C under the supervision of Dr. KANDICE Banner Electronically Signed   By: Cordella Banner   On: 09/04/2024 12:51   DG Abd Portable 1V Result Date: 09/03/2024 CLINICAL DATA:  Small bowel obstruction. EXAM: PORTABLE ABDOMEN - 1 VIEW COMPARISON:  09/02/2024 FINDINGS: Minimal gaseous distention of small bowel in the left abdomen identified measuring up to 3.0 cm diameter. Gas is seen scattered along the length of a nondilated colon. Diffuse degenerative change in the lumbar spine. IMPRESSION: Minimal gaseous distention of small bowel in the left abdomen. Electronically Signed   By: Camellia Candle M.D.   On: 09/03/2024 10:14   DG Abd 1 View Result Date: 09/02/2024 EXAM: 1 VIEW XRAY OF THE ABDOMEN 09/02/2024 05:20:28 AM COMPARISON: 08/31/2024 CLINICAL HISTORY: Abdominal distention FINDINGS: LINES, TUBES AND DEVICES: Enteric tube in place with tip and side port projecting over the  stomach. BOWEL: Centralized bowel loops within the lower abdomen, which can be seen in the setting of ascites. Nonobstructive bowel gas pattern. SOFT TISSUES: Vascular calcifications. Surgical material in the right hemiabdomen. No opaque urinary calculi. BONES: Degenerative changes of the lumbar spine. No acute osseous abnormality. IMPRESSION: 1. Centralized bowel loops within the lower abdomen, which can be seen with ascites 2. Enteric tube tip and side port projecting over the stomach Electronically signed by: Waddell Calk MD 09/02/2024 09:10 AM EDT RP Workstation: [id number]   CT ABDOMEN PELVIS WO CONTRAST Result Date: 08/31/2024 CLINICAL DATA:  Diffuse abdominal pain EXAM: CT ABDOMEN AND PELVIS WITHOUT CONTRAST TECHNIQUE: Multidetector CT imaging of the abdomen and pelvis was performed following the standard protocol without IV contrast. RADIATION DOSE REDUCTION: This exam was performed according to the departmental dose-optimization program which includes automated exposure control, adjustment of the mA and/or kV according to patient size and/or use of iterative reconstruction technique. COMPARISON:  CT from the previous day. FINDINGS: Lower chest: Small pleural effusions are noted bilaterally with associated basilar atelectasis. This has increased slightly in the interval from the prior exam. Hepatobiliary: The degree of perihepatic fluid appears to have increased slightly in the interval from the prior exam. No definitive hepatic mass is seen. The gallbladder has been surgically removed. Pancreas: Unremarkable. No pancreatic ductal dilatation or surrounding inflammatory changes. Spleen:  Spleen is within normal limits although increased perisplenic fluid is noted when compared with the prior study. Adrenals/Urinary Tract: Adrenal glands are within normal limits. Kidneys demonstrate excretion from prior CT examination. No mass lesion is seen. No obstructive changes are noted. The bladder is decompressed by  Foley catheter. Stomach/Bowel: Scattered mild diverticular change of the colon is noted. Fluid is noted throughout the colon it may be related to an underlying diarrheal state. No obstructive changes are seen. The appendix is well visualized and similar to that seen on the prior exam. No perforation is identified. Some inflammatory changes noted in the right lower quadrant similar to that seen on prior exam. This may simply be related to underlying inflammatory change of the cecum. No obstructive changes of the small bowel are noted. Stomach is well distended with a gastric catheter within. Vascular/Lymphatic: Atherosclerotic calcifications are identified. Scattered lymph nodes are noted surrounding the celiac axis as well as in the right lower quadrant. Reproductive: Uterus has been surgically removed. No definitive adnexal mass is noted. Other: The omental thickening and nodularity is similar to that seen on the previous day. This along with the ascites is again suspicious for peritoneal carcinomatosis. No free air is noted to suggest perforation Musculoskeletal: Degenerative changes of the thoracolumbar spine are noted. No acute abnormality is seen. IMPRESSION: Increase in small pleural effusions and basilar atelectasis when compared with the previous day. Mild increase in the degree of ascites when compared with the previous day. Stable omental thickening and nodular densities suspicious for carcinomatosis. No findings to suggest acute perforation are noted. The inflammatory changes in the right lower quadrant are again identified. The appendix does not appear to be involved. Previously seen gastric distension has resolved. Electronically Signed   By: Oneil Devonshire M.D.   On: 08/31/2024 20:37   DG Abd 1 View Result Date: 08/31/2024 CLINICAL DATA:  Nasogastric tube placement. EXAM: ABDOMEN - 1 VIEW COMPARISON:  Radiograph yesterday FINDINGS: Tip and side port of the enteric tube below the diaphragm in the  stomach. Excreted IV contrast in the renal collecting systems from prior CT. No bowel dilatation in the upper abdomen. IMPRESSION: Tip and side port of the enteric tube below the diaphragm in the stomach. Electronically Signed   By: Andrea Gasman M.D.   On: 08/31/2024 14:37   DG Abd 1 View Result Date: 08/30/2024 CLINICAL DATA:  Check gastric catheter placement EXAM: ABDOMEN - 1 VIEW COMPARISON:  None Available. FINDINGS: Gastric catheter is noted in the distal stomach. No free air is seen. No other focal abnormality is noted. IMPRESSION: Gastric catheter within the stomach. Electronically Signed   By: Oneil Devonshire M.D.   On: 08/30/2024 23:09   CT ABDOMEN PELVIS W CONTRAST Result Date: 08/30/2024 CLINICAL DATA:  Acute generalized abdominal pain, hematemesis. EXAM: CT ABDOMEN AND PELVIS WITH CONTRAST TECHNIQUE: Multidetector CT imaging of the abdomen and pelvis was performed using the standard protocol following bolus administration of intravenous contrast. RADIATION DOSE REDUCTION: This exam was performed according to the departmental dose-optimization program which includes automated exposure control, adjustment of the mA and/or kV according to patient size and/or use of iterative reconstruction technique. CONTRAST:  80mL OMNIPAQUE  IOHEXOL  300 MG/ML  SOLN COMPARISON:  August 29, 2023. FINDINGS: Lower chest: Stable nodule seen in lingular segment left upper lobe. Mild left lingular subsegmental atelectasis or infiltrate is noted. Hepatobiliary: No focal liver abnormality is seen. Status post cholecystectomy. No biliary dilatation. Pancreas: Unremarkable. No pancreatic ductal dilatation or surrounding inflammatory changes.  Spleen: Normal in size without focal abnormality. Adrenals/Urinary Tract: Adrenal glands are unremarkable. Kidneys are normal, without renal calculi, focal lesion, or hydronephrosis. Bladder is unremarkable. Stomach/Bowel: Severe gastric distention is noted with moderate wall thickening of  gastric antrum suggesting peptic ulcer disease. Severe inflammatory changes are seen involving the cecum suggesting infectious or inflammatory colitis. The appendix appears to be thickened and inflamed suggesting either appendicitis or inflammation secondary to cecal inflammation. Mildly dilated small bowel loops are noted which may represent ileus secondary to inflammation. Vascular/Lymphatic: Aortic atherosclerosis. Enlarged lymph nodes are noted in mesentery of right lower quadrant which most likely are inflammatory in etiology. Reproductive: Status post hysterectomy. No adnexal masses. Other: Mild ascites is noted in the abdomen and pelvis and around the liver and spleen. Nodular densities are noted anteriorly in the pelvis and involving the omentum concerning for peritoneal carcinomatosis. This includes 17 mm lymph node anteriorly on image number 48 of series 2. Musculoskeletal: No acute or significant osseous findings. IMPRESSION: 1. Severe gastric distention is noted with moderate wall thickening of gastric antrum suggesting peptic ulcer disease. 2. Severe inflammatory changes are seen involving the cecum suggesting infectious or inflammatory colitis. The appendix appears to be thickened and inflamed suggesting either appendicitis or inflammation secondary to cecal inflammation. 3. Mild ascites is noted in the abdomen and pelvis and around the liver and spleen. 4. Nodular densities are noted anteriorly in the pelvis and involving the omentum concerning for peritoneal carcinomatosis. This includes 17 mm lymph node anteriorly. 5. Mildly dilated small bowel loops are noted which may represent ileus secondary to inflammation. 6. Stable nodule seen in lingular segment left upper lobe. Mild left lingular subsegmental atelectasis or infiltrate is noted. 7. Aortic atherosclerosis. Aortic Atherosclerosis (ICD10-I70.0). Electronically Signed   By: Lynwood Landy Raddle M.D.   On: 08/30/2024 18:03      Discharge  Exam: Vitals:   09/07/24 0834 09/07/24 1922  BP: (!) 152/86 135/82  Pulse: (!) 114 (!) 111  Resp: 18   Temp: 98.6 F (37 C) 97.7 F (36.5 C)  SpO2: 92% 94%   Vitals:   09/06/24 1943 09/07/24 0429 09/07/24 0834 09/07/24 1922  BP: 128/85 (!) 145/76 (!) 152/86 135/82  Pulse: (!) 117 (!) 114 (!) 114 (!) 111  Resp: 16 16 18    Temp: 97.7 F (36.5 C) 97.7 F (36.5 C) 98.6 F (37 C) 97.7 F (36.5 C)  TempSrc: Oral Oral Oral   SpO2: 94% 92% 92% 94%  Weight:      Height:       General exam: Resting easily. Respiratory system: Upper airway rattles and rhonchi. Cardiovascular system: S1 & S2 heard, RRR.  Gastrointestinal system: Abdomen is soft and distended neuro: Sleeping, comfortable appearing   The results of significant diagnostics from this hospitalization (including imaging, microbiology, ancillary and laboratory) are listed below for reference.     Microbiology: Recent Results (from the past 240 hours)  C Difficile Quick Screen w PCR reflex     Status: None   Collection Time: 09/01/24  6:40 AM   Specimen: STOOL  Result Value Ref Range Status   C Diff antigen NEGATIVE NEGATIVE Final   C Diff toxin NEGATIVE NEGATIVE Final   C Diff interpretation No C. difficile detected.  Final    Comment: Performed at [facility], [address] [place], [place] [address]  Aerobic/Anaerobic Culture w Gram Stain (surgical/deep wound)     Status: None (Preliminary result)   Collection Time: 09/03/24  3:05 PM  Specimen: Abdomen; Peritoneal Fluid  Result Value Ref Range Status   Specimen Description   Final    ABDOMEN Performed at [facility], [address], [place], [place] [address]    Special Requests   Final    NONE Performed at [facility], [address] [place], [place] [address]    Gram Stain NO WBC SEEN RARE GRAM NEGATIVE RODS   Final   Culture   Final    NO GROWTH 4 DAYS NO ANAEROBES ISOLATED; CULTURE IN PROGRESS FOR 5 DAYS Performed  at [facility], [address], [place], [place] [address]    Report Status PENDING  Incomplete     Labs: BNP (last 3 results) No results for input(s): BNP in the last 8760 hours. Basic Metabolic Panel: Recent Labs  Lab 09/02/24 0625 09/03/24 0553 09/04/24 0604 09/05/24 0511  NA  --  148* 149* 145  K 3.3* 4.1  --   --   CL  --  107  --   --   CO2  --  17*  --   --   GLUCOSE  --  176*  --   --   BUN  --  17  --   --   CREATININE  --  0.93  --   --   CALCIUM  --  8.5*  --   --   MG  --  1.8 1.9  --   PHOS 2.5  --   --   --    Liver Function Tests: Recent Labs  Lab 09/03/24 0553  AST 19  ALT 14  ALKPHOS 67  BILITOT 0.5  PROT 5.9*  ALBUMIN 2.4*   No results for input(s): LIPASE, AMYLASE in the last 168 hours. No results for input(s): AMMONIA in the last 168 hours. CBC: Recent Labs  Lab 09/02/24 1220 09/03/24 0757 09/04/24 0604  WBC 6.0 5.9 5.7  NEUTROABS 4.9  --   --   HGB 13.1 12.3 11.5*  HCT 40.8 39.0 37.2  MCV 87.4 86.7 87.7  PLT 318 300 275   Cardiac Enzymes: No results for input(s): CKTOTAL, CKMB, CKMBINDEX, TROPONINI in the last 168 hours. BNP: Invalid input(s): POCBNP CBG: Recent Labs  Lab 09/06/24 0823 09/06/24 1139 09/06/24 1622 09/06/24 2033 09/07/24 0846  GLUCAP 262* 271* 252* 216* 300*   D-Dimer No results for input(s): DDIMER in the last 72 hours. Hgb A1c No results for input(s): HGBA1C in the last 72 hours. Lipid Profile No results for input(s): CHOL, HDL, LDLCALC, TRIG, CHOLHDL, LDLDIRECT in the last 72 hours. Thyroid  function studies No results for input(s): TSH, T4TOTAL, T3FREE, THYROIDAB in the last 72 hours.  Invalid input(s): FREET3 Anemia work up No results for input(s): VITAMINB12, FOLATE, FERRITIN, TIBC, IRON, RETICCTPCT in the last 72 hours. Urinalysis    Component Value Date/Time   COLORURINE YELLOW (A) 08/30/2024 1805   APPEARANCEUR HAZY (A) 08/30/2024  1805   APPEARANCEUR Cloudy (A) 01/06/2017 0943   LABSPEC 1.028 08/30/2024 1805   PHURINE 5.0 08/30/2024 1805   GLUCOSEU NEGATIVE 08/30/2024 1805   HGBUR NEGATIVE 08/30/2024 1805   BILIRUBINUR NEGATIVE 08/30/2024 1805   BILIRUBINUR Negative 01/06/2017 0943   KETONESUR NEGATIVE 08/30/2024 1805   PROTEINUR 30 (A) 08/30/2024 1805   UROBILINOGEN 0.2 11/23/2016 1419   NITRITE NEGATIVE 08/30/2024 1805   LEUKOCYTESUR TRACE (A) 08/30/2024 1805   Sepsis Labs Recent Labs  Lab 09/02/24 1220 09/03/24 0757 09/04/24 0604  WBC 6.0 5.9 5.7  Microbiology Recent Results (from the past 240 hours)  C Difficile Quick Screen w PCR reflex     Status: None   Collection Time: 09/01/24  6:40 AM   Specimen: STOOL  Result Value Ref Range Status   C Diff antigen NEGATIVE NEGATIVE Final   C Diff toxin NEGATIVE NEGATIVE Final   C Diff interpretation No C. difficile detected.  Final    Comment: Performed at [facility], [address] [place], [place] [address]  Aerobic/Anaerobic Culture w Gram Stain (surgical/deep wound)     Status: None (Preliminary result)   Collection Time: 09/03/24  3:05 PM   Specimen: Abdomen; Peritoneal Fluid  Result Value Ref Range Status   Specimen Description   Final    ABDOMEN Performed at [facility], [address], [place], [place] [address]    Special Requests   Final    NONE Performed at [facility], [address] [place], [place] [address]    Gram Stain NO WBC SEEN RARE GRAM NEGATIVE RODS   Final   Culture   Final    NO GROWTH 4 DAYS NO ANAEROBES ISOLATED; CULTURE IN PROGRESS FOR 5 DAYS Performed at [facility], [address], [place], [place] [address]    Report Status PENDING  Incomplete     Time coordinating discharge: 32 min   SIGNED: Jamaury Gumz, DO [facility] 09/08/2024, 12:53 PM Pager   If 7PM-7AM, please contact night-coverage

## 2024-09-08 NOTE — Progress Notes (Signed)
 Spoke with [facility] regarding possible ETA for pick up. States it will be around 2140.

## 2024-09-08 NOTE — Progress Notes (Signed)
 Transferring to hospice house, [facility], today. Continues on morphine drip. Resp 7/per minute. Called to give report. No answer, left message to call back

## 2024-09-08 NOTE — Progress Notes (Signed)
Patient waiting for EMS transport.

## 2024-09-09 LAB — AEROBIC/ANAEROBIC CULTURE W GRAM STAIN (SURGICAL/DEEP WOUND)
Culture: NO GROWTH
Gram Stain: NONE SEEN

## 2024-09-22 DEATH — deceased

## 2025-10-30 ENCOUNTER — Ambulatory Visit: Payer: Medicare PPO | Admitting: Dermatology
